# Patient Record
Sex: Female | Born: 1955 | Race: White | Hispanic: No | Marital: Married | Smoking: Current every day smoker
Health system: Southern US, Community
[De-identification: ages and names within clinical notes are randomized; demographics above are authoritative.]

## PROBLEM LIST (undated history)

## (undated) DIAGNOSIS — R410 Disorientation, unspecified: Secondary | ICD-10-CM

## (undated) DIAGNOSIS — I10 Essential (primary) hypertension: Secondary | ICD-10-CM

## (undated) DIAGNOSIS — B1920 Unspecified viral hepatitis C without hepatic coma: Secondary | ICD-10-CM

## (undated) HISTORY — PX: KNEE ARTHROSCOPY: SUR90

---

## 2003-09-12 ENCOUNTER — Other Ambulatory Visit: Payer: Self-pay

## 2005-01-21 ENCOUNTER — Ambulatory Visit: Payer: Self-pay

## 2005-02-20 ENCOUNTER — Emergency Department: Payer: Self-pay | Admitting: Emergency Medicine

## 2005-03-11 ENCOUNTER — Ambulatory Visit: Payer: Self-pay | Admitting: Family Medicine

## 2006-04-23 ENCOUNTER — Emergency Department: Payer: Self-pay | Admitting: Emergency Medicine

## 2006-12-03 ENCOUNTER — Emergency Department: Payer: Self-pay | Admitting: Emergency Medicine

## 2006-12-06 ENCOUNTER — Emergency Department: Payer: Self-pay | Admitting: Emergency Medicine

## 2007-03-24 ENCOUNTER — Emergency Department: Payer: Self-pay | Admitting: Internal Medicine

## 2008-05-14 ENCOUNTER — Emergency Department: Payer: Self-pay | Admitting: Emergency Medicine

## 2008-10-15 ENCOUNTER — Emergency Department: Payer: Self-pay | Admitting: Emergency Medicine

## 2009-06-12 ENCOUNTER — Ambulatory Visit: Payer: Self-pay | Admitting: Family Medicine

## 2009-11-13 ENCOUNTER — Emergency Department: Payer: Self-pay | Admitting: Emergency Medicine

## 2010-06-18 ENCOUNTER — Ambulatory Visit: Payer: Self-pay | Admitting: Internal Medicine

## 2010-07-04 ENCOUNTER — Ambulatory Visit: Payer: Self-pay | Admitting: Internal Medicine

## 2012-05-26 ENCOUNTER — Emergency Department: Payer: Self-pay | Admitting: Emergency Medicine

## 2012-05-28 ENCOUNTER — Emergency Department: Payer: Self-pay | Admitting: Emergency Medicine

## 2012-09-04 ENCOUNTER — Observation Stay: Payer: Self-pay | Admitting: Internal Medicine

## 2012-09-04 LAB — CBC
HCT: 39.3 % (ref 35.0–47.0)
HGB: 13.6 g/dL (ref 12.0–16.0)
MCHC: 34.6 g/dL (ref 32.0–36.0)
Platelet: 294 10*3/uL (ref 150–440)
RBC: 4.71 10*6/uL (ref 3.80–5.20)
WBC: 9.6 10*3/uL (ref 3.6–11.0)

## 2012-09-04 LAB — URINALYSIS, COMPLETE
Blood: NEGATIVE
Glucose,UR: NEGATIVE mg/dL (ref 0–75)
Ketone: NEGATIVE
Nitrite: NEGATIVE
RBC,UR: 2 /HPF (ref 0–5)
Specific Gravity: 1.005 (ref 1.003–1.030)

## 2012-09-04 LAB — TSH: Thyroid Stimulating Horm: 1.47 u[IU]/mL

## 2012-09-04 LAB — DRUG SCREEN, URINE
Amphetamines, Ur Screen: NEGATIVE (ref ?–1000)
Barbiturates, Ur Screen: NEGATIVE (ref ?–200)
Benzodiazepine, Ur Scrn: NEGATIVE (ref ?–200)
Cannabinoid 50 Ng, Ur ~~LOC~~: NEGATIVE (ref ?–50)
MDMA (Ecstasy)Ur Screen: NEGATIVE (ref ?–500)
Methadone, Ur Screen: NEGATIVE (ref ?–300)
Opiate, Ur Screen: NEGATIVE (ref ?–300)

## 2012-09-04 LAB — COMPREHENSIVE METABOLIC PANEL
Alkaline Phosphatase: 116 U/L (ref 50–136)
Anion Gap: 9 (ref 7–16)
BUN: 10 mg/dL (ref 7–18)
Calcium, Total: 8.9 mg/dL (ref 8.5–10.1)
Chloride: 107 mmol/L (ref 98–107)
EGFR (African American): >60
EGFR (Non-African Amer.): >60
Glucose: 106 mg/dL — ABNORMAL HIGH (ref 65–99)
Osmolality: 281 (ref 275–301)
SGPT (ALT): 23 U/L (ref 12–78)
Sodium: 141 mmol/L (ref 136–145)

## 2012-09-04 LAB — ETHANOL: Ethanol %: <0.003 % (ref 0.000–0.080)

## 2012-09-05 ENCOUNTER — Inpatient Hospital Stay: Payer: Self-pay | Admitting: Psychiatry

## 2012-09-05 LAB — POTASSIUM: Potassium: 3.9 mmol/L (ref 3.5–5.1)

## 2012-09-30 ENCOUNTER — Inpatient Hospital Stay: Payer: Self-pay | Admitting: Psychiatry

## 2012-09-30 LAB — COMPREHENSIVE METABOLIC PANEL
Albumin: 3.1 g/dL — ABNORMAL LOW (ref 3.4–5.0)
Alkaline Phosphatase: 96 U/L (ref 50–136)
Anion Gap: 5 — ABNORMAL LOW (ref 7–16)
Calcium, Total: 8.3 mg/dL — ABNORMAL LOW (ref 8.5–10.1)
Chloride: 107 mmol/L (ref 98–107)
Co2: 28 mmol/L (ref 21–32)
EGFR (African American): >60
EGFR (Non-African Amer.): >60
Potassium: 3.3 mmol/L — ABNORMAL LOW (ref 3.5–5.1)
SGOT(AST): 24 U/L (ref 15–37)
SGPT (ALT): 23 U/L (ref 12–78)
Total Protein: 6.2 g/dL — ABNORMAL LOW (ref 6.4–8.2)

## 2012-09-30 LAB — ETHANOL: Ethanol %: <0.003 % (ref 0.000–0.080)

## 2012-09-30 LAB — CBC
HCT: 37.2 % (ref 35.0–47.0)
MCH: 29.6 pg (ref 26.0–34.0)
RBC: 4.31 10*6/uL (ref 3.80–5.20)
RDW: 14.4 % (ref 11.5–14.5)
WBC: 7.4 10*3/uL (ref 3.6–11.0)

## 2012-09-30 LAB — URINALYSIS, COMPLETE
Bacteria: NONE SEEN
Glucose,UR: NEGATIVE mg/dL (ref 0–75)
Ketone: NEGATIVE
RBC,UR: <1 /HPF (ref 0–5)
Specific Gravity: 1.009 (ref 1.003–1.030)
Squamous Epithelial: 1
WBC UR: 3 /HPF (ref 0–5)

## 2012-09-30 LAB — DRUG SCREEN, URINE
Barbiturates, Ur Screen: NEGATIVE (ref ?–200)
Benzodiazepine, Ur Scrn: POSITIVE (ref ?–200)
Cannabinoid 50 Ng, Ur ~~LOC~~: NEGATIVE (ref ?–50)
MDMA (Ecstasy)Ur Screen: NEGATIVE (ref ?–500)
Methadone, Ur Screen: NEGATIVE (ref ?–300)
Opiate, Ur Screen: POSITIVE (ref ?–300)
Tricyclic, Ur Screen: NEGATIVE (ref ?–1000)

## 2013-02-05 ENCOUNTER — Emergency Department: Payer: Self-pay | Admitting: Emergency Medicine

## 2014-05-25 NOTE — Discharge Summary (Signed)
PATIENT NAME:  Rachel BergeronROGERS, Anola A MR#:  [id number] DATE OF BIRTH:  1955/08/20  DATE OF ADMISSION:  09/04/2012 DATE OF DISCHARGE:  09/05/2012  DISCHARGE DIAGNOSES:  1.  Drug overdose/suicide attempt with 15 to 30 tablets of Zoloft, now medically stable and back to baseline.  2.  Hypokalemia, repleted and resolved.   SECONDARY DIAGNOSES: 1.  Hypertension.  2.  Depression.  3.  Chronic pain syndrome.   CONSULTATIONS: Psychiatry, Dr. Jennet MaduroPucilowska.   PROCEDURES AND RADIOLOGY:  None.   LABORATORY PANEL:  UA on admission was negative.  Serum Tylenol level less than 2.   HISTORY AND SHORT HOSPITAL COURSE: The patient is a 59 year old female with the above-mentioned medical problems who was admitted for Zoloft overdose with a suicide attempt, was placed on suicide precautions with 1-to-1 sitter and observation status on the medical service. Please see Dr. Hilbert OdorSainani's dictated history and physical for further details. The patient did not have any further QT prolongations.  Psychiatry consultation was obtained with Dr. Jennet MaduroPucilowska, who recommended transfer to inpatient behavioral medicine. The patient was medically cleared and was transferred to inpatient behavioral medicine in stable condition.   PERTINENT DISCHARGE PHYSICAL EXAMINATION: VITAL SIGNS: On the date of discharge, temperature 98.9, heart rate 65 per minute, respirations 18 per minute, blood pressure 108/70 mmHg.  She was saturating 96% on room air.  CARDIOVASCULAR: S1, S2 normal. No murmurs, rubs or gallop.  LUNGS: Clear to auscultation bilaterally. No wheezing, rales, rhonchi or crepitation.  ABDOMEN: Soft, benign.  NEUROLOGIC: Nonfocal examination.  All other physical examination remained at baseline.   DISCHARGE MEDICATIONS: 1.  Calcium carbonate with vitamin D 2 tablets once daily.  2.  Atenolol/chlorthalidone 100 mg/25 mg 1 tablet p.o. daily.  3.  Oxycodone 30 mg p.o. b.i.d.  4.  Trazodone 200 mg p.o. at bedtime.   DISCHARGE DIET:  Regular.   DISCHARGE ACTIVITY:  As tolerated.   DISCHARGE INSTRUCTIONS AND FOLLOWUP:  The patient was instructed to follow up with her primary care physician in 2 to 4 weeks. She was also instructed to follow up with her psychiatrist once discharged in 1 to 2 weeks.   TOTAL TIME DISCHARGING THIS PATIENT: 45 minutes.   ____________________________ Ellamae SiaVipul S. Sherryll BurgerShah, MD vss:cb D: 09/05/2012 18:32:13 ET T: 09/05/2012 22:49:46 ET JOB#: [id number]  cc: Marvetta Vohs S. Sherryll BurgerShah, MD, <Dictator> Jolanta B. Jennet MaduroPucilowska, MD Primary care physician  Patricia PesaVIPUL S Nasia Cannan MD ELECTRONICALLY SIGNED 09/09/2012 12:26

## 2014-05-25 NOTE — H&P (Signed)
PATIENT NAME:  [name], Nara A MR#:  [id number] DATE OF BIRTH:  Aug 30, 1955  DATE OF ADMISSION:  09/04/2012  PRIMARY CARE PHYSICIAN: Does not have one.   CHIEF COMPLAINT: Drug overdose/suicide attempt.   HISTORY OF PRESENT ILLNESS: This is a 59 year old female who presents to the hospital due to a suicide attempt and a drug overdose. The patient took about 15 to 30 pills 100 mg tablets of Zoloft, although the patient denies that she was not trying to kill herself. The patient's  husband, noticed this and called EMS and she was brought to the ER. The ER physician discussed with poison control. The patient needs to be observed due to a high risk for respiratory depression and also a prolonged QT and serotonin syndrome. The patient clinically does not show any effects of  respiratory depression or any prolonged QT on her EKG presently.   REVIEW OF SYSTEMS:  CONSTITUTIONAL: No documented fever. No weight gain or weight loss.  EYES: No blurred or double vision.  ENT: No tinnitus. No postnasal drip. No redness of the oropharynx.  RESPIRATORY: No cough, no wheeze, hemoptysis, no dyspnea.  CARDIOVASCULAR: No chest pain, no orthopnea, no palpitations, no syncope.  GASTROINTESTINAL: No nausea, no vomiting, diarrhea, no abdominal pain, no melena or hematochezia.  GENITOURINARY: No dysuria or hematuria.  ENDOCRINE: No polyuria or nocturia. No heat or cold intolerance.  HEMATOLOGIC: No anemia, bruising, bleeding.  INTEGUMENTARY: No rashes. No lesions.  MUSCULOSKELETAL: No arthritis. No swelling. No gout.  NEUROLOGIC: No numbness or tingling. No ataxia. No seizure-type activity.  PSYCHIATRIC: No anxiety, no insomnia, positive depression. No ADD.   PAST MEDICAL HISTORY: Consistent with hypertension, depression, chronic pain syndrome.   ALLERGIES: ERYTHROMYCIN, PENICILLIN, TETRACYCLINE; ALL OF WHICH CAUSE HIVES.   SOCIAL HISTORY: Does smoke about pack per day, has been smoking for the past 40 years. No  alcohol abuse does. Does abuse cocaine, did it two days ago.   FAMILY HISTORY: The patient's father died from a suicide attempt, mother died from malignancy, but unknown type.   CURRENT MEDICATIONS: As follows: OxyContin 30 mg b.i.d., atenolol/chlorthalidone 25/100 one tab daily, calcium carbonate with vitamin D 1 tab b.i.d. and trazodone 200 mg at bedtime.   PHYSICAL EXAMINATION:  Is as follows: Temperature is 98.5, pulse 80, respirations 19, blood pressure 162/99, sats 98% on room air.  GENERAL: She is a pleasant-appearing female in no apparent distress.  HEAD, EYES, EARS, NOSE, THROAT: The patient is atraumatic, normocephalic. Extraocular muscles were intact. Pupils are equal and reactive to light. Sclerae anicteric. No conjunctival injection. No pharyngeal erythema.  NECK: Supple. There is no jugular venous distention. No bruits, no lymphadenopathy, no thyromegaly.  HEART: Regular rate and rhythm. No murmurs. No rubs. No clicks.  LUNGS: Clear to auscultation bilaterally. No rales or rhonchi. No wheezes.  ABDOMEN: Soft, flat, nontender, nondistended. Has good bowel sounds. No hepatosplenomegaly appreciated.  EXTREMITIES: No evidence of any cyanosis, clubbing, or peripheral edema. Has +2 pedal and radial pulses bilaterally.  NEUROLOGICAL: The patient is alert, awake and oriented x 3 with no focal motor or sensory deficits appreciated bilaterally.  SKIN: Moist and warm with no rashes appreciated.  LYMPHATIC: There is no cervical or axillary lymphadenopathy.   LABORATORY, DIAGNOSTIC AND RADIOLOGIC DATA: Serum glucose of 106, BUN 10, creatinine 0.6, sodium 141, potassium 3.3, chloride 107, bicarbonate 25. LFTs within normal limits. TSH 1.4. Urine tox positive for cocaine. White cell count 9.6, hemoglobin 13.6, hematocrit 39.3, platelet count 294. Urinalysis within normal limits.  ASSESSMENT AND PLAN: This is a 59 year old female with history of depression, chronic pain syndrome, hypertension,  presents to the hospital due to drug overdose/suicide attempt. The patient took about 15 to 30 pills of Zoloft 100 mg tablet.   1.  Drug overdose/suicide attempt. Again, as mentioned, the patient did taking increasing amounts of Zoloft 100 mg tabs about 15 to 30 pills about 3 to 4 hours ago. ER physician discussed with poison control that the patient is at high risk for having serotonin syndrome and respiratory depression and a possible prolonged QT syndrome., Therefore, observe the patient on telemetry, check a repeat EKG in the morning. The patient has been involuntarily committed. Will continue with a sitter and get a psychiatric consult in the morning.  2. Hypertension. The patient is  presently hemodynamically stable. Continue atenolol, chlorthalidone.  3.  Chronic pain. Continue OxyContin.  4.  Hypokalemia. I will replace her potassium accordingly and repeat it in the morning.   CODE STATUS: The patient is a full code.   TIME SPENT: 45 minutes    ____________________________ Rolly Pancake. Cherlynn Kaiser, MD vjs:cc D: 09/04/2012 18:45:26 ET T: 09/04/2012 20:16:56 ET JOB#: [id number]  cc: Rolly Pancake. Cherlynn Kaiser, MD, <Dictator> Houston Siren MD ELECTRONICALLY SIGNED 09/05/2012 14:48

## 2014-05-25 NOTE — H&P (Signed)
PATIENT NAME:  Rachel [name], Rachel [name] MR#:  [id number] DATE OF BIRTH:  Nov 14, 1955  DATE OF ADMISSION:  09/05/2012  REFERRING PHYSICIAN: Vipul S. Sherryll BurgerShah, MD  ATTENDING PHYSICIAN: Taylor Levick B. Jennet MaduroPucilowska, MD  IDENTIFYING DATA: Ms.  Rachel RudRogers is [name] 59 year old female with history of depression.   CHIEF COMPLAINT: "I overdosed."  HISTORY OF PRESENT ILLNESS: Ms. Rachel RudRogers was admitted to medical floor after she overdosed on Zoloft. Her husband noticed the overdose and called the ambulance. She has been [name] patient at [facility] and has been taking Zoloft 100 mg [name] day for an extended period of time. She reports that she got into an argument with her husband, who has mental illness himself, has been off medications and difficult to be around. She denies that she wanted to kill herself, but really felt at the end of her rope. She reports that she had not planned this attempt and did it on an impulse. She reports poor sleep, decreased appetite, anhedonia, feeling of guilt, hopelessness, worthlessness, poor sleep and energy. She cries. She isolates herself. She has no interest in any activities except for taking care of her dog. She reports periods of increased mood, hyperactivity, insomnia, irritability and out-of-character behavior lasting for [name] week or so. Her last episode was in December 2013 when she stole something from [employer] and was arrested. She has many episodes over her lifetime. Sometimes she has smaller episodes, lasting 2 or 3 days. She denies ever getting psychotic. She denies alcohol or substance abuse. She, however, does not use pain killers. She was prescribed some OxyContin for an abscess in the past and admitted to the nurse that she is getting Lortab from the streets. On another occasion, she said that she can get Dilaudid. This reportedly is for back pain.   PAST PSYCHIATRIC HISTORY: She reports 1 suicide attempt when young. She was not hospitalized for that. She has never been in substance abuse treatment. She has  [name] psychiatrist.   FAMILY PSYCHIATRIC HISTORY: Her father committed suicide and also another cousin.   PAST MEDICAL HISTORY: Hepatitis C, back pain, hypertension.   ALLERGIES: ERYTHROMYCIN, PENICILLIN, TETRACYCLINE.   MEDICATION ON ADMISSION: Atenolol 50 mg daily, calcium and vitamin D combination, [place] 25 mg daily, OxyContin 30 mg twice daily.   SOCIAL HISTORY: She used to work in [employer] homes and at [employer]. She stopped working 2 years ago when her arthritis would not allow her to work anymore. She lost insurance at that time. She has no income. All of her income comes from her husband's disability. They do not  get any food stamps, as the husband does not have his ID and is paranoid and refuses to apply for food stamps. She has not been seeing any of her doctors since 2012. She does not apply for disability, yet her financial situation is very bad. She eats once [name] day. She has [name] dog and believes the dog food comes before her own.  REVIEW OF SYSTEMS:    CONSTITUTIONAL: No fevers or chills. No weight changes.  EYES: No double or blurred vision.  ENT: No hearing loss.  RESPIRATORY: No shortness of breath or cough.  CARDIOVASCULAR: No chest pain or orthopnea.  GASTROINTESTINAL: No abdominal pain, nausea, vomiting or diarrhea.  GENITOURINARY: No incontinence or frequency.  ENDOCRINE: No heat or cold intolerance.  LYMPHATIC: No anemia or easy bruising.  INTEGUMENTARY: No acne or rash.  MUSCULOSKELETAL: Positive for back pain and arthritis in her hands.  NEUROLOGIC: No tingling or weakness.  PSYCHIATRIC: See  history of present illness for details.   PHYSICAL EXAMINATION: VITAL SIGNS: Blood pressure 120/86, pulse 69, respirations 18, temperature 98.  GENERAL: This is [name] slender female in no acute distress.  HEENT: The pupils are equal, round and reactive to light. Sclerae are anicteric.  NECK: Supple. No thyromegaly.  LUNGS: Clear to auscultation. No dullness to percussion.  HEART:  Regular rhythm and rate. No murmurs, rubs or gallops.  ABDOMEN: Soft, nontender, nondistended. Positive bowel sounds.  MUSCULOSKELETAL: Normal muscle strength in all extremities.  SKIN: No rashes or bruises.  LYMPHATIC: No cervical adenopathy.  NEUROLOGIC: Cranial nerves II through XII were intact.   LABORATORY DATA: Chemistries are within normal limits. LFTs within normal limits. TSH 1.47. Urine tox screen is positive for cocaine. CBC within normal limits. Urinalysis is not suggestive of urinary tract infection. Serum acetaminophen less than 2.  MENTAL STATUS EXAMINATION ON ADMISSION: The patient is alert and oriented to person, place, time and situation. She is marginally cooperative, rather unhappy to be coming to psychiatry after an overdose. She is wearing hospital scrubs. She maintains good eye contact. Her speech is loud and pressured. Mood is depressed with agitated affect. Thought processing is logical. There is flight of ideas. She denies suicidal or homicidal ideation, but was admitted after [name] suicide attempt by medication overdose. She denies psychotic symptoms. No paranoia, delusions or hallucinations. Her cognition is grossly intact. She registers 3 out of 3 and recalls 3 out of 3 objects after 5 minutes. She can spell "world" forward and backward. She knows the current president. Her insight and judgment are questionable.   SUICIDE RISK ASSESSMENT ON ADMISSION: This is [name] patient with [name] long history of depression, mood instability, substance abuse, who is in [name] difficult social situation and who attempted suicide.   INITIAL DIAGNOSES:  AXIS I: Mood disorder, not otherwise specified; rule out bipolar affective disorder; opioid abuse; cocaine abuse.  AXIS II: Deferred.  AXIS III: Hypertension, hepatitis C, arthritis, chronic pain.  AXIS IV: Mental illness, substance abuse, financial, employment,  marital, access to care, primary support.  AXIS V: Global Assessment of Functioning on  admission: 25.   PLAN: The patient was admitted to [facility] for safety, stabilization and medication management. She was initially placed on suicide precautions and was closely monitored for any unsafe behaviors. She underwent full psychiatric and risk assessment. She received pharmacotherapy, individual and group psychotherapy, substance abuse counseling and support from therapeutic milieu.  1.  Suicidal ideation: The patient denies. 2.  We will start Tegretol for mood stabilization. Her liver function tests are normal. We will recheck liver function test. She has no insurance, so the only other option would be an antipsychotic, which she will not be able to afford. We will attempt to obtain [facility] support.  3.  Medical: We will continue all other medications as prescribed by her attending at the time of transfer from medicine.  4.  Substance abuse: She minimizes her problems and declines treatment.  5.  Disposition: She will be discharged to home.    ____________________________ Ellin Goodie. Jennet Maduro, MD jbp:jm D: 09/06/2012 15:37:39 ET T: 09/06/2012 16:36:36 ET JOB#: [id number]  cc: Jenetta Wease B. Jennet Maduro, MD, <Dictator> Shari Prows MD ELECTRONICALLY SIGNED 09/09/2012 6:38

## 2014-05-25 NOTE — Consult Note (Signed)
Brief Consult Note: Diagnosis: Bipolar II.   Patient was seen by consultant.   Recommend further assessment or treatment.   Orders entered.   Comments: Will admit to psychiatry.  Electronic Signatures: Kristine LineaPucilowska, Maribell Demeo (MD)  (Signed 29-Aug-14 12:26)  Authored: Brief Consult Note   Last Updated: 29-Aug-14 12:26 by Kristine LineaPucilowska, Maryana Pittmon (MD)

## 2014-05-25 NOTE — Discharge Summary (Signed)
PATIENT NAME:  Rachel Rachel [name], Rachel Rachel [name] MR#:  [id number] DATE OF BIRTH:  08/22/1955  DATE OF ADMISSION:  09/04/2012 DATE OF DISCHARGE:  09/05/2012  DISCHARGE DIAGNOSES:  1.  Drug overdose/suicide attempt with 15 to 30 tablets of Zoloft, now medically stable and back to baseline.  2.  Hypokalemia, repleted and resolved.   SECONDARY DIAGNOSES: 1.  Hypertension.  2.  Depression.  3.  Chronic pain syndrome.   CONSULTATIONS: Psychiatry, Dr. Pucilowska.   PROCEDURES AND RADIOLOGY:  None.   LABORATORY PANEL:  UA on admission was negative.  Serum Tylenol level less than 2.   HISTORY AND SHORT HOSPITAL COURSE: The patient is Rachel [name] 59-year-old female with the above-mentioned medical problems who was admitted for Zoloft overdose with Rachel [name] suicide attempt, was placed on suicide precautions with 1-to-1 sitter and observation status on the medical service. Please see Dr. Sainani's dictated history and physical for further details. The patient did not have any further QT prolongations.  Psychiatry consultation was obtained with Dr. Pucilowska, who recommended transfer to inpatient behavioral medicine. The patient was medically cleared and was transferred to inpatient behavioral medicine in stable condition.   PERTINENT DISCHARGE PHYSICAL EXAMINATION: VITAL SIGNS: On the date of discharge, temperature 98.9, heart rate 65 per minute, respirations 18 per minute, blood pressure 108/70 mmHg.  She was saturating 96% on room air.  CARDIOVASCULAR: S1, S2 normal. No murmurs, rubs or gallop.  LUNGS: Clear to auscultation bilaterally. No wheezing, rales, rhonchi or crepitation.  ABDOMEN: Soft, benign.  NEUROLOGIC: Nonfocal examination.  All other physical examination remained at baseline.   DISCHARGE MEDICATIONS: 1.  Calcium carbonate with vitamin D 2 tablets once daily.  2.  Atenolol/chlorthalidone 100 mg/25 mg 1 tablet p.o. daily.  3.  Oxycodone 30 mg p.o. b.i.d.  4.  Trazodone 200 mg p.o. at bedtime.   DISCHARGE DIET:  Regular.   DISCHARGE ACTIVITY:  As tolerated.   DISCHARGE INSTRUCTIONS AND FOLLOWUP:  The patient was instructed to follow up with her primary care physician in 2 to 4 weeks. She was also instructed to follow up with her psychiatrist once discharged in 1 to 2 weeks.   TOTAL TIME DISCHARGING THIS PATIENT: 45 minutes.   ____________________________ Rachel Wrigley S. Omer Monter, MD vss:cb D: 09/05/2012 18:32:13 ET T: 09/05/2012 22:49:46 ET JOB#: [id number]  cc: Rachel Helbling S. Mirjana Tarleton, MD, <Dictator> Rachel B. Pucilowska, MD Primary care physician  Rachel Vanderwoude S Hallie Ertl MD ELECTRONICALLY SIGNED 09/09/2012 12:26 

## 2014-05-25 NOTE — Consult Note (Signed)
Brief Consult Note: Diagnosis: Mood d/o NOS, S/P suicide attempts.   Patient was seen by consultant.   Recommend further assessment or treatment.   Orders entered.   Discussed with Attending MD.   Comments: Ms. Rachel [name] has a h/o depression, anxiety and mood instability. She was admitted after a suicide attempt by OD in the context of multiple stressors.   PLAN: 1. Will transfer to psychiatry.  Electronic Signatures: Kristine LineaPucilowska, Doreen Garretson (MD)  (Signed 04-Aug-14 14:29)  Authored: Brief Consult Note   Last Updated: 04-Aug-14 14:29 by Kristine LineaPucilowska, Takila Kronberg (MD)

## 2014-06-12 NOTE — H&P (Signed)
PATIENT NAME:  Rachel [name], [name] [id number] OF BIRTH:  01-17-1956 OF ADMISSION:  09/30/2012 PHYSICIAN: Emergency Room MDPHYSICIAN: Yzabelle Calles B. Jennet MaduroPucilowska, MD DATA: Ms.  Rachel [name] is a 59 year old female with history of depression and substance abuse.  COMPLAINT: "I could not afford my medications." OF PRESENT ILLNESS: Ms. Rachel [name] was [facility] weeks ago. She was doing fine but run out of Risperdal that she found very helpful. She couldn?t afford it and there was a problem with ALAMAP application. She became increasingly moody and irritable. She relapsed on cocaine and started abusing prescription pills. She became suicidal and homicidal when she realized that her girlfriend was offered a job that the patient was hoping to get. She reports poor sleep, decreased appetite, anhedonia, feeling of guilt, hopelessness, worthlessness, poor sleep and energy, crying spells, social isolation, irritability and thoughts of hurting herself and others.  PSYCHIATRIC HISTORY: There was one suicide attempt in her youth. She has been abusing multiple substances but has never in substance abuse treatment. She has a psychiatrist.  PSYCHIATRIC HISTORY: Her father and a cousin committed a suicide.   MEDICAL HISTORY: Hepatitis C, back pain, hypertension.   ALLERGIES: ERYTHROMYCIN, PENICILLIN, TETRACYCLINE.  ON ADMISSION: The patient was not compliant. Medications at the time of recent discharge: Medication Instructions  trazodone  200 milligram(s) orally once a day (at bedtime) for insomnia.   atenolol-chlorthalidone 100 mg-25 mg oral tablet  1 tab(s) orally once a day for high blood pressure.   risperidone 2 mg oral tablet  1 tab(s) orally 2 times a day for mood stabilization.   hydroxyzine hydrochloride 50 mg oral tablet  1 tab(s) orally every 8 hours, As needed, anxiety   calcarb with d 600 mg-400 intl units oral tablet  2  orally once a day for bone loss prevention.   albuterol cfc free 90 mcg/inh inhalation aerosol    inhaled 2 puffs every 4 hours as needed for shortness of breath.   carbamazepine 200 mg oral tablet  1 tab(s) orally 2 times a day for mood stabilization.   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 She stopped working 2 years ago when her arthritis would not allow her to work anymore. She lost insurance at that time. She has no income. All of her income comes from her husband's disability. They do not  get any food stamps, as the husband does not have his ID and is paranoid and refuses to apply for food stamps. She has not been seeing any of her doctors since 2012. She does not apply for disability, yet her financial situation is very bad. She eats once a day. She has a dog and believes the dog food comes before her own. OF SYSTEMS:   No fevers or chills. No weight changes. No double or blurred vision. No hearing loss. No shortness of breath or cough. No chest pain or orthopnea. No abdominal pain, nausea, vomiting or diarrhea. No incontinence or frequency. No heat or cold intolerance. No anemia or easy bruising. No acne or rash. Positive for back pain and arthritis in her hands. No tingling or weakness. See history of present illness for details.  EXAMINATION:SIGNS: Blood pressure 170/90, pulse 88, respirations 20, temperature 98. This is a slender female in no acute distress. The pupils are equal, round and reactive to light. Sclerae are anicteric. Supple. No thyromegaly. Clear to auscultation. No dullness to percussion. Regular rhythm and rate. No murmurs, rubs or gallops. Soft, nontender, nondistended. Positive bowel sounds. Normal  muscle strength in all extremities. No rashes or bruises. No cervical adenopathy. Cranial nerves II through XII were intact.  DATA: Chemistries are within normal limits except K 3.3. BAL is zero. LFTs within normal limits. TSH 2.29. Urine tox screen is positive for benzodiazepines, cocaine and opioids. CBC within normal limits. Urinalysis is not suggestive of  urinary tract infection.  STATUS EXAMINATION ON ADMISSION: The patient is alert and oriented to person, place, time and situation. She is pleasant, polite and cooperative with me as she recognizes me from previous admission. She is irritable and argumentative with staff in the ER. She is wearing hospital scrubs. She maintains good eye contact. Her speech is loud and pressured at times. Mood is depressed with agitated affect. Thought processing is logical. She endorses suicidal or homicidal ideation, but is able to contract for safety in the hospital. She denies paranoia, delusions or hallucinations. Her cognition is grossly intact. She registers 3 out of 3 and recalls 3 out of 3 objects after 5 minutes. She can spell "world" forward and backward. She knows the current president. Her insight and judgment are questionable.  RISK ASSESSMENT ON ADMISSION: This is a patient with a long history of depression, mood instability, substance abuse, poor treatment compliance and suicide attempts who is in a difficult social situation. She is at increased risk of suicide.   DIAGNOSES: I:  Bipolar II disorder.   Opioid abuse.abuse. abuse. AXIS II:  Deferred. III:  Hypertension, hepatitis C, arthritis, chronic pain. IV:  Mental illness, substance abuse, financial, employment, marital, access to care, primary support. V:  Global Assessment of Functioning on admission 25.  The patient was admitted to [facility] Unit for safety, stabilization and medication management. She was initially placed on suicide precautions and was closely monitored for any unsafe behaviors. She underwent full psychiatric and risk assessment. She received pharmacotherapy, individual and group psychotherapy, substance abuse counseling and support from therapeutic milieu.   Suicidal ideation: The patient is able to contract for safety.   Mood: We restarted all her medications. Access is a problem.   Medical:  We continued antihypertensives, inhalers and Vit D supplement as prescribed by PCP.     Substance abuse: She minimizes her problems and declines treatment.   Disposition: She will be discharged to home.     Electronic Signatures: Kristine LineaPucilowska, Marieliz Strang (MD)  (Signed on 30-Aug-14 07:38)  Authored  Last Updated: 30-Aug-14 07:38 by Kristine LineaPucilowska, Vallerie Hentz (MD)

## 2015-07-08 ENCOUNTER — Encounter: Payer: Self-pay | Admitting: Pharmacist

## 2015-07-18 ENCOUNTER — Encounter: Payer: Self-pay | Admitting: Pharmacist

## 2015-08-08 ENCOUNTER — Encounter: Payer: Self-pay | Admitting: Pharmacist

## 2015-09-18 ENCOUNTER — Encounter (INDEPENDENT_AMBULATORY_CARE_PROVIDER_SITE_OTHER): Payer: Self-pay

## 2016-01-25 ENCOUNTER — Emergency Department
Admission: EM | Admit: 2016-01-25 | Discharge: 2016-01-25 | Disposition: A | Discharge status: Home / Self Care | Payer: Medicare Other | Attending: Emergency Medicine | Admitting: Emergency Medicine

## 2016-01-25 ENCOUNTER — Encounter: Payer: Self-pay | Admitting: Emergency Medicine

## 2016-01-25 DIAGNOSIS — I10 Essential (primary) hypertension: Secondary | ICD-10-CM | POA: Insufficient documentation

## 2016-01-25 DIAGNOSIS — F17210 Nicotine dependence, cigarettes, uncomplicated: Secondary | ICD-10-CM | POA: Insufficient documentation

## 2016-01-25 DIAGNOSIS — L02411 Cutaneous abscess of right axilla: Secondary | ICD-10-CM

## 2016-01-25 HISTORY — DX: Unspecified viral hepatitis C without hepatic coma: B19.20

## 2016-01-25 HISTORY — DX: Essential (primary) hypertension: I10

## 2016-01-25 MED ORDER — KETOROLAC TROMETHAMINE 60 MG/2ML IM SOLN
60.0000 mg | Freq: Once | INTRAMUSCULAR | Status: AC
  Administered 2016-01-25: 60 mg via INTRAMUSCULAR
  Filled 2016-01-25: qty 2

## 2016-01-25 MED ORDER — IBUPROFEN 600 MG PO TABS: 600.0000 mg | ORAL_TABLET | Freq: Three times a day (TID) | ORAL | 0 refills | Status: DC | PRN

## 2016-01-25 MED ORDER — HYDROMORPHONE HCL 1 MG/ML IJ SOLN
1.0000 mg | Freq: Once | INTRAMUSCULAR | Status: AC
  Administered 2016-01-25: 1 mg via INTRAMUSCULAR
  Filled 2016-01-25: qty 1

## 2016-01-25 MED ORDER — OXYCODONE-ACETAMINOPHEN 7.5-325 MG PO TABS: 1.0000 | ORAL_TABLET | ORAL | 0 refills | Status: DC | PRN

## 2016-01-25 MED ORDER — CLINDAMYCIN HCL 150 MG PO CAPS: 150.0000 mg | ORAL_CAPSULE | Freq: Four times a day (QID) | ORAL | 0 refills | Status: DC

## 2016-01-25 NOTE — ED Triage Notes (Signed)
Painful reddened area R axilla. States started as "hair bump"

## 2016-01-25 NOTE — Discharge Instructions (Signed)
Apply warm compresses for 10 minutes 3 times a day.

## 2016-01-25 NOTE — ED Notes (Signed)

## 2016-01-25 NOTE — ED Provider Notes (Signed)
[facility] Emergency Department Provider Note   ____________________________________________   First MD Initiated Contact with Patient 01/25/16 1322     (approximate)  I have reviewed the triage vital signs and the nursing notes.   HISTORY  Chief Complaint Abscess    HPI Rachel [name] is a 60 y.o. female patient complaining of pain and redness and swelling to the right axillary area for 2 days. Patient area started as a hair bump. Patient rating the pain as a 10 over 10. Patient described a pain as "sharp". Patient state unable to raise her arm above his secondary to pain. No palliative measures taken for this complaint.   Past Medical History:  Diagnosis Date  . Hepatitis C   . Hypertension     There are no active problems to display for this patient.   History reviewed. No pertinent surgical history.  Prior to Admission medications   Medication Sig Start Date End Date Taking? Authorizing Provider  clindamycin (CLEOCIN) 150 MG capsule Take 1 capsule (150 mg total) by mouth 4 (four) times daily. 01/25/16   Joni Reiningonald K Ivet Guerrieri, PA-C  ibuprofen (ADVIL,MOTRIN) 600 MG tablet Take 1 tablet (600 mg total) by mouth every 8 (eight) hours as needed. 01/25/16   Joni Reiningonald K Yulitza Shorts, PA-C  oxyCODONE-acetaminophen (PERCOCET) 7.5-325 MG tablet Take 1 tablet by mouth every 4 (four) hours as needed for severe pain. 01/25/16   Joni Reiningonald K Bee Marchiano, PA-C    Allergies Tetracyclines & related  No family history on file.  Social History Social History  Substance Use Topics  . Smoking status: Current Every Day Smoker    Packs/day: 1.00    Types: Cigarettes  . Smokeless tobacco: Not on file  . Alcohol use Not on file    Review of Systems Constitutional: No fever/chills Eyes: No visual changes. ENT: No sore throat. Cardiovascular: Denies chest pain. Respiratory: Denies shortness of breath. Gastrointestinal: No abdominal pain.  No nausea, no vomiting.  No diarrhea.   No constipation. Genitourinary: Negative for dysuria. Musculoskeletal: Negative for back pain. Skin: Negative for rash. Redness and swelling to right axillary area Neurological: Negative for headaches, focal weakness or numbness. Allergic/Immunilogical: See medication list ____________________________________   PHYSICAL EXAM:  VITAL SIGNS: ED Triage Vitals  Enc Vitals Group     BP 01/25/16 1245 (!) 153/71     Pulse Rate 01/25/16 1245 (!) 103     Resp 01/25/16 1245 20     Temp 01/25/16 1245 98.3 F (36.8 C)     Temp Source 01/25/16 1245 Oral     SpO2 01/25/16 1245 98 %     Weight 01/25/16 1247 111 lb (50.3 kg)     Height 01/25/16 1247 5\' 6"  (1.676 m)     Head Circumference --      Peak Flow --      Pain Score 01/25/16 1248 10     Pain Loc --      Pain Edu? --      Excl. in GC? --     Constitutional: Alert and oriented. Well appearing and in no acute distress. Eyes: Conjunctivae are normal. PERRL. EOMI. Head: Atraumatic. Nose: No congestion/rhinnorhea. Mouth/Throat: Mucous membranes are moist.  Oropharynx non-erythematous. Neck: No stridor.  No cervical spine tenderness to palpation. Hematological/Lymphatic/Immunilogical: No cervical lymphadenopathy. Cardiovascular: Normal rate, regular rhythm. Grossly normal heart sounds.  Good peripheral circulation. Respiratory: Normal respiratory effort.  No retractions. Lungs CTAB. Gastrointestinal: Soft and nontender. No distention. No abdominal bruits. No CVA tenderness.  Musculoskeletal: No lower extremity tenderness nor edema.  No joint effusions. Neurologic:  Normal speech and language. No gross focal neurologic deficits are appreciated. No gait instability. Skin:  Skin is warm, dry and intact. No rash noted. Nodule nonfluctuant lesion right axillary area. Psychiatric: Mood and affect are normal. Speech and behavior are normal.  ____________________________________________   LABS (all labs ordered are listed, but only abnormal  results are displayed)  Labs Reviewed - No data to display ____________________________________________  EKG   ____________________________________________  RADIOLOGY   ____________________________________________   PROCEDURES  Procedure(s) performed: None  Procedures  Critical Care performed: No  ____________________________________________   INITIAL IMPRESSION / ASSESSMENT AND PLAN / ED COURSE  Pertinent labs & imaging results that were available during my care of the patient were reviewed by me and considered in my medical decision making (see chart for details).  Right axillary abscess. Patient given discharge care instructions. Patient given a prescription for clindamycin, Percocet, and ibuprofen. Patient advised return back to the ED if no improvement or worsening complaint 2 days.  Clinical Course    Patient has a early onset of a right axillary abscess. Lesion is nonfluctuant and discussed rationale for not incision and drainage this time. Patient will be started on clindamycin and given Percocet and ibuprofen.  ____________________________________________   FINAL CLINICAL IMPRESSION(S) / ED DIAGNOSES  Final diagnoses:  Abscess of axilla, right      NEW MEDICATIONS STARTED DURING THIS VISIT:  New Prescriptions   CLINDAMYCIN (CLEOCIN) 150 MG CAPSULE    Take 1 capsule (150 mg total) by mouth 4 (four) times daily.   IBUPROFEN (ADVIL,MOTRIN) 600 MG TABLET    Take 1 tablet (600 mg total) by mouth every 8 (eight) hours as needed.   OXYCODONE-ACETAMINOPHEN (PERCOCET) 7.5-325 MG TABLET    Take 1 tablet by mouth every 4 (four) hours as needed for severe pain.     Note:  This document was prepared using Dragon voice recognition software and may include unintentional dictation errors.    Joni ReiningRonald K Adalina Dopson, PA-C 01/25/16 1339    Governor Rooksebecca Lord, MD 01/25/16 [phone]

## 2016-04-24 ENCOUNTER — Emergency Department: Payer: Medicare HMO

## 2016-04-24 ENCOUNTER — Emergency Department
Admission: EM | Admit: 2016-04-24 | Discharge: 2016-04-25 | Disposition: A | Discharge status: Home / Self Care | Payer: Medicare HMO | Attending: Emergency Medicine | Admitting: Emergency Medicine

## 2016-04-24 ENCOUNTER — Encounter: Payer: Self-pay | Admitting: Emergency Medicine

## 2016-04-24 DIAGNOSIS — Z5181 Encounter for therapeutic drug level monitoring: Secondary | ICD-10-CM | POA: Diagnosis not present

## 2016-04-24 DIAGNOSIS — Z79899 Other long term (current) drug therapy: Secondary | ICD-10-CM | POA: Insufficient documentation

## 2016-04-24 DIAGNOSIS — I10 Essential (primary) hypertension: Secondary | ICD-10-CM | POA: Diagnosis not present

## 2016-04-24 DIAGNOSIS — R441 Visual hallucinations: Secondary | ICD-10-CM | POA: Insufficient documentation

## 2016-04-24 DIAGNOSIS — R4182 Altered mental status, unspecified: Secondary | ICD-10-CM | POA: Diagnosis not present

## 2016-04-24 DIAGNOSIS — F4489 Other dissociative and conversion disorders: Secondary | ICD-10-CM | POA: Diagnosis not present

## 2016-04-24 DIAGNOSIS — F17210 Nicotine dependence, cigarettes, uncomplicated: Secondary | ICD-10-CM | POA: Insufficient documentation

## 2016-04-24 DIAGNOSIS — R918 Other nonspecific abnormal finding of lung field: Secondary | ICD-10-CM | POA: Diagnosis not present

## 2016-04-24 DIAGNOSIS — R410 Disorientation, unspecified: Secondary | ICD-10-CM | POA: Diagnosis not present

## 2016-04-24 DIAGNOSIS — R69 Illness, unspecified: Secondary | ICD-10-CM | POA: Diagnosis not present

## 2016-04-24 LAB — CBC WITH DIFFERENTIAL/PLATELET
BASOS ABS: 0.1 10*3/uL (ref 0–0.1)
BASOS PCT: 0 %
EOS ABS: 0.1 10*3/uL (ref 0–0.7)
Eosinophils Relative: 1 %
HEMATOCRIT: 45.4 % (ref 35.0–47.0)
HEMOGLOBIN: 15.3 g/dL (ref 12.0–16.0)
Lymphocytes Relative: 15 %
Lymphs Abs: 1.8 10*3/uL (ref 1.0–3.6)
MCH: 27.1 pg (ref 26.0–34.0)
MCHC: 33.7 g/dL (ref 32.0–36.0)
MCV: 80.3 fL (ref 80.0–100.0)
MONOS PCT: 7 %
Monocytes Absolute: 0.8 10*3/uL (ref 0.2–0.9)
NEUTROS PCT: 77 %
Neutro Abs: 9.3 10*3/uL — ABNORMAL HIGH (ref 1.4–6.5)
Platelets: 326 10*3/uL (ref 150–440)
RBC: 5.65 MIL/uL — AB (ref 3.80–5.20)
RDW: 16.5 % — ABNORMAL HIGH (ref 11.5–14.5)
WBC: 12.1 10*3/uL — ABNORMAL HIGH (ref 3.6–11.0)

## 2016-04-24 LAB — COMPREHENSIVE METABOLIC PANEL
ALBUMIN: 4.2 g/dL (ref 3.5–5.0)
ALT: 20 U/L (ref 14–54)
ANION GAP: 11 (ref 5–15)
AST: 21 U/L (ref 15–41)
Alkaline Phosphatase: 104 U/L (ref 38–126)
BILIRUBIN TOTAL: 0.7 mg/dL (ref 0.3–1.2)
BUN: 18 mg/dL (ref 6–20)
CHLORIDE: 102 mmol/L (ref 101–111)
CO2: 21 mmol/L — ABNORMAL LOW (ref 22–32)
Calcium: 9 mg/dL (ref 8.9–10.3)
Creatinine, Ser: 0.83 mg/dL (ref 0.44–1.00)
GFR calc Af Amer: >60 mL/min (ref >60)
Glucose, Bld: 117 mg/dL — ABNORMAL HIGH (ref 65–99)
POTASSIUM: 3.6 mmol/L (ref 3.5–5.1)
Sodium: 134 mmol/L — ABNORMAL LOW (ref 135–145)
TOTAL PROTEIN: 7.8 g/dL (ref 6.5–8.1)

## 2016-04-24 LAB — URINE DRUG SCREEN, QUALITATIVE (ARMC ONLY)
AMPHETAMINES, UR SCREEN: NOT DETECTED
BENZODIAZEPINE, UR SCRN: NOT DETECTED
Barbiturates, Ur Screen: NOT DETECTED
CANNABINOID 50 NG, UR ~~LOC~~: NOT DETECTED
Cocaine Metabolite,Ur ~~LOC~~: NOT DETECTED
MDMA (Ecstasy)Ur Screen: NOT DETECTED
Methadone Scn, Ur: NOT DETECTED
Opiate, Ur Screen: POSITIVE — AB
PHENCYCLIDINE (PCP) UR S: NOT DETECTED
Tricyclic, Ur Screen: NOT DETECTED

## 2016-04-24 LAB — URINALYSIS, COMPLETE (UACMP) WITH MICROSCOPIC
BACTERIA UA: NONE SEEN
Bilirubin Urine: NEGATIVE
GLUCOSE, UA: NEGATIVE mg/dL
Hgb urine dipstick: NEGATIVE
KETONES UR: NEGATIVE mg/dL
LEUKOCYTES UA: NEGATIVE
NITRITE: NEGATIVE
PH: 5 (ref 5.0–8.0)
PROTEIN: NEGATIVE mg/dL
Specific Gravity, Urine: 1.009 (ref 1.005–1.030)

## 2016-04-24 LAB — ETHANOL

## 2016-04-24 LAB — TROPONIN I: Troponin I: <0.03 ng/mL (ref <0.03)

## 2016-04-24 MED ORDER — LORAZEPAM 2 MG/ML IJ SOLN
2.0000 mg | Freq: Once | INTRAMUSCULAR | Status: AC
  Administered 2016-04-24: 2 mg via INTRAMUSCULAR
  Filled 2016-04-24: qty 1

## 2016-04-24 MED ORDER — HALOPERIDOL LACTATE 5 MG/ML IJ SOLN
5.0000 mg | Freq: Once | INTRAMUSCULAR | Status: AC
  Administered 2016-04-24: 5 mg via INTRAMUSCULAR
  Filled 2016-04-24: qty 1

## 2016-04-24 MED ORDER — NICOTINE 21 MG/24HR TD PT24
21.0000 mg | MEDICATED_PATCH | Freq: Once | TRANSDERMAL | Status: AC
  Administered 2016-04-24: 21 mg via TRANSDERMAL
  Filled 2016-04-24: qty 1

## 2016-04-24 MED ORDER — DIPHENHYDRAMINE HCL 50 MG/ML IJ SOLN
50.0000 mg | Freq: Once | INTRAMUSCULAR | Status: AC
  Administered 2016-04-24: 50 mg via INTRAMUSCULAR
  Filled 2016-04-24: qty 1

## 2016-04-24 MED ORDER — OLANZAPINE 5 MG PO TABS
5.0000 mg | ORAL_TABLET | Freq: Two times a day (BID) | ORAL | Status: DC
  Administered 2016-04-24 – 2016-04-25 (×2): 5 mg via ORAL
  Filled 2016-04-24: qty 1

## 2016-04-24 MED ORDER — OLANZAPINE 5 MG PO TABS
ORAL_TABLET | ORAL | Status: AC
  Administered 2016-04-24: 5 mg via ORAL
  Filled 2016-04-24: qty 1
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
## 2016-04-24 NOTE — ED Notes (Signed)
Pt upset in room stating that she wants to leave. Pt 's family members in verbal altercation with pt due to the fact that they want her to stay and receive more help. Per Dr. Shaune PollackLord, pt is IVC'd at this point. Pt moved to room 20. Danelle EarthlyNoel RN given report on pt.

## 2016-04-24 NOTE — ED Notes (Signed)
Husband states patient unable to have MRI due to possible needle piece stuck in neck. Has hx of drug use.

## 2016-04-24 NOTE — ED Notes (Signed)
Updated pt on status.

## 2016-04-24 NOTE — ED Notes (Signed)
SOC

## 2016-04-24 NOTE — ED Notes (Signed)
Pt in doorway yelling at staff stating that she doesn't understand why she's here, staff has explained to pt multiple times as to why she is here as well as the process that is taking place. Pt continues yelling and cursing at staff. Pt slams door, Rn Danelle EarthlyNoel, [employer] Mc[employer]doo, this tech at pt bedside at this time

## 2016-04-24 NOTE — ED Notes (Signed)
Pt given graham crackers and peanut butter after being explained that she could not have a third sandwich tray, pt upset and yelling at this tech, pt threw graham crackers and peanut butter in the hallway and again slammed her door. Officer Mcadoo in pt room.
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
## 2016-04-25 LAB — RAPID HIV SCREEN (HIV 1/2 AB+AG)
HIV 1/2 Antibodies: NONREACTIVE
HIV-1 P24 ANTIGEN - HIV24: NONREACTIVE

## 2016-04-25 LAB — CK: CK TOTAL: 349 U/L — AB (ref 38–234)

## 2016-04-25 LAB — AMMONIA
AMMONIA: 30 umol/L (ref 9–35)
Ammonia: 48 umol/L — ABNORMAL HIGH (ref 9–35)

## 2016-04-25 MED ORDER — LACTULOSE 10 GM/15ML PO SOLN
30.0000 g | Freq: Once | ORAL | Status: AC
  Administered 2016-04-25: 30 g via ORAL
  Filled 2016-04-25: qty 60

## 2016-04-25 NOTE — ED Notes (Signed)
Patient transferred back to the ED Quad area for follow up on ammonia level.

## 2016-04-25 NOTE — ED Notes (Signed)
Helped pt. Call a friend, pt. Currently talking to a friend named Minerva Areolaric.

## 2016-04-25 NOTE — ED Notes (Signed)
Pt. Given her clothes and personal items.  Pt. Given a blue scrub top.

## 2016-04-25 NOTE — ED Provider Notes (Addendum)
 -----------------------------------------   7:47 AM on 04/25/2016 -----------------------------------------   Blood pressure (!) 151/89, pulse 68, temperature 98 F (36.7 C), temperature source Oral, resp. rate 16, height 5\' 6"  (1.676 m), weight 110 lb (49.9 kg), SpO2 99 %.  The patient had no acute events since last update.  Calm and cooperative at this time.  Disposition is pending Psychiatry/Behavioral Medicine team recommendations.     ----------------------------------------- 3:33 PM on 04/25/2016 -----------------------------------------  Patient here with the above complaints. Her demeanor at this time is calm and relaxed she does not appear to be at this time manic. She did have an elevated ammonia has a history of hepatitis C. I have given her lactulose. I discussed with Dr. Laural BenesJohnson, he does not feel she requires admission to the hospital this time but he does recommend repeat ammonia level which we will obtain. If that is reassuring, we'll have her reassessed by psychiatry as she has no ongoing complaints patient does not appear to be hallucinating and she is holding cultures which would make at this time although perhaps this is a waxing and waning pathology. No evidence of ongoing infection. Her function tests are normal. Unclear why she has a slight bump in her ammonia not certain if that's also contributory to her recent complaints. Signed out to Dr. Scotty CourtStafford while this is an ongoing evaluation   Jeanmarie PlantJames A Arthur Speagle, MD 04/25/16 1534

## 2016-04-25 NOTE — ED Notes (Signed)
Patient asleep in room. No noted distress or abnormal behavior. Will continue 15 minute checks and observation by security cameras for safety. 

## 2016-04-25 NOTE — ED Notes (Signed)
IVC p SOC 

## 2016-04-25 NOTE — ED Notes (Signed)
Pt requesting to go outside to smoke, explained to pt that she is IVCed at this time and we cant let here leave until the psych MD clears her. Pt upset that she is unable to go outside and now requesting that we give her back her belongings so she can walk out of here. RN notified.

## 2016-04-25 NOTE — ED Notes (Signed)
Patient talking with husband via telephone.

## 2016-04-25 NOTE — ED Notes (Signed)
Patient cooperative with nursing assessment but unable to answer most questions. She says she "can't remember." She does report noticing a decline in memory over time.  Patient denies any significant psychiatric symptoms. She denies SI or HI. Denies any hallucinations. She does not appear to be paranoid. Patient received meal tray and beverage. Maintained on 15 minute checks and observation by security camera for safety.

## 2016-04-25 NOTE — ED Notes (Signed)

## 2016-04-25 NOTE — ED Notes (Signed)
Per staff report [facility]

## 2016-04-25 NOTE — ED Provider Notes (Addendum)
-----------------------------------------   4:51 PM on 04/25/2016 -----------------------------------------   Blood pressure (!) 151/89, pulse 68, temperature 98 F (36.7 C), temperature source Oral, resp. rate 16, height 5\' 6"  (1.676 m), weight 110 lb (49.9 kg), SpO2 99 %.  The patient had no acute events since last update.  Repeat ammonia level is normal. Calm and cooperative at this time.  Pt is medically stable and does not have an identifiable organic cause for her psychiatric symptoms. We will obtain a repeat psych consult.  Disposition is pending Psychiatry/Behavioral Medicine team recommendations.     Sharman CheekPhillip Hattye Siegfried, MD 04/25/16 1652    ----------------------------------------- 9:19 PM on 04/25/2016 -----------------------------------------  Psych consult reviewed. Feel she is psychiatrically stable and symptoms have resolved. Suitable for outpatient follow-up. Remains medically stable. Patient is awake and alert and energetic and walk and talkative. We'll prepare discharge with follow-up instructions.    Sharman CheekPhillip Lashika Erker, MD 04/25/16 2120

## 2016-04-25 NOTE — ED Notes (Signed)
Pt. Up and using bathroom. 

## 2016-04-25 NOTE — ED Notes (Signed)
Pt. Husband called back to stated he arranged transportation of patient home.   Pt. Will be picked up in waiting room.  Pt. Has all belonging that she came in with.  Pt. Smoked in bathroom, just before discharge.

## 2016-04-25 NOTE — ED Notes (Addendum)
called by Sharrie RothmanEric Moisan, husband, [phone], called  "No medications in over one month or maybe two..., several deaths within the family with the last 2-3 years, two admissions to psych ward last year, diagnosed with major depression, PTSD (pt found father s/p suicide [facility]), anxiety, bipolar with mania, COPD, HTN, rheumatoid arthritis, Hepatitis C  Pharmacy, [facility], on [address]  [facility], pt of "some lady psychiatrist"  Pt hx of heroin IV use, smoking crack, and taking percocets

## 2016-04-25 NOTE — ED Notes (Signed)
Gave pt a blanket

## 2016-04-26 DIAGNOSIS — I10 Essential (primary) hypertension: Secondary | ICD-10-CM | POA: Diagnosis not present

## 2016-04-26 DIAGNOSIS — R4182 Altered mental status, unspecified: Secondary | ICD-10-CM | POA: Diagnosis not present

## 2016-04-26 DIAGNOSIS — Z880 Allergy status to penicillin: Secondary | ICD-10-CM | POA: Diagnosis not present

## 2016-04-26 DIAGNOSIS — R410 Disorientation, unspecified: Secondary | ICD-10-CM | POA: Diagnosis not present

## 2016-04-26 DIAGNOSIS — Z8619 Personal history of other infectious and parasitic diseases: Secondary | ICD-10-CM | POA: Diagnosis not present

## 2016-04-26 DIAGNOSIS — R69 Illness, unspecified: Secondary | ICD-10-CM | POA: Diagnosis not present

## 2016-04-26 LAB — THYROID PANEL WITH TSH
Free Thyroxine Index: 2.9 (ref 1.2–4.9)
T3 Uptake Ratio: 21 % — ABNORMAL LOW (ref 24–39)
T4, Total: 13.6 ug/dL — ABNORMAL HIGH (ref 4.5–12.0)
TSH: 5.57 u[IU]/mL — ABNORMAL HIGH (ref 0.450–4.500)

## 2016-04-26 LAB — URINE CULTURE: Culture: <10000 — AB

## 2016-04-26 LAB — RPR: RPR Ser Ql: NONREACTIVE

## 2016-04-29 ENCOUNTER — Telehealth: Payer: Self-pay | Admitting: Emergency Medicine

## 2016-04-29 NOTE — Telephone Encounter (Signed)
Called patient to assure she is aware of thyroid tests done in ED are abnormal.  She says she was not aware.  I gave the specific result values to her and she plans to give them to her doctor at [facility].  She is not sure who her doctor is there, but assures me she will advise them of the results.

## 2016-04-30 LAB — CULTURE, BLOOD (ROUTINE X 2)
CULTURE: NO GROWTH
Culture: NO GROWTH
SPECIAL REQUESTS: ADEQUATE
Special Requests: ADEQUATE

## 2016-05-01 ENCOUNTER — Encounter: Payer: Self-pay | Admitting: Emergency Medicine

## 2016-05-01 ENCOUNTER — Emergency Department
Admission: EM | Admit: 2016-05-01 | Discharge: 2016-05-03 | Disposition: A | Discharge status: Other Acute Inpatient Hospital | Payer: Medicare HMO | Attending: Emergency Medicine | Admitting: Emergency Medicine

## 2016-05-01 DIAGNOSIS — F17200 Nicotine dependence, unspecified, uncomplicated: Secondary | ICD-10-CM | POA: Diagnosis present

## 2016-05-01 DIAGNOSIS — Z046 Encounter for general psychiatric examination, requested by authority: Secondary | ICD-10-CM

## 2016-05-01 DIAGNOSIS — R451 Restlessness and agitation: Secondary | ICD-10-CM

## 2016-05-01 DIAGNOSIS — F3181 Bipolar II disorder: Secondary | ICD-10-CM | POA: Diagnosis present

## 2016-05-01 DIAGNOSIS — F1110 Opioid abuse, uncomplicated: Secondary | ICD-10-CM | POA: Diagnosis present

## 2016-05-01 DIAGNOSIS — Z79899 Other long term (current) drug therapy: Secondary | ICD-10-CM | POA: Insufficient documentation

## 2016-05-01 DIAGNOSIS — I10 Essential (primary) hypertension: Secondary | ICD-10-CM | POA: Insufficient documentation

## 2016-05-01 DIAGNOSIS — R4689 Other symptoms and signs involving appearance and behavior: Secondary | ICD-10-CM | POA: Diagnosis not present

## 2016-05-01 DIAGNOSIS — F319 Bipolar disorder, unspecified: Secondary | ICD-10-CM | POA: Insufficient documentation

## 2016-05-01 DIAGNOSIS — F17210 Nicotine dependence, cigarettes, uncomplicated: Secondary | ICD-10-CM | POA: Diagnosis not present

## 2016-05-01 DIAGNOSIS — R69 Illness, unspecified: Secondary | ICD-10-CM | POA: Diagnosis not present

## 2016-05-01 LAB — URINE DRUG SCREEN, QUALITATIVE (ARMC ONLY)
AMPHETAMINES, UR SCREEN: NOT DETECTED
BENZODIAZEPINE, UR SCRN: NOT DETECTED
Barbiturates, Ur Screen: NOT DETECTED
Cannabinoid 50 Ng, Ur ~~LOC~~: NOT DETECTED
Cocaine Metabolite,Ur ~~LOC~~: NOT DETECTED
MDMA (ECSTASY) UR SCREEN: NOT DETECTED
Methadone Scn, Ur: NOT DETECTED
Opiate, Ur Screen: POSITIVE — AB
PHENCYCLIDINE (PCP) UR S: NOT DETECTED
TRICYCLIC, UR SCREEN: NOT DETECTED

## 2016-05-01 MED ORDER — OLANZAPINE 10 MG PO TABS
10.0000 mg | ORAL_TABLET | Freq: Two times a day (BID) | ORAL | Status: DC
  Administered 2016-05-02 – 2016-05-03 (×4): 10 mg via ORAL
  Filled 2016-05-01 (×4): qty 1

## 2016-05-01 MED ORDER — LORAZEPAM 1 MG PO TABS
1.0000 mg | ORAL_TABLET | Freq: Two times a day (BID) | ORAL | Status: DC | PRN
Start: 2016-05-01 — End: 2016-05-03
  Administered 2016-05-02 (×2): 1 mg via ORAL
  Filled 2016-05-01 (×2): qty 1

## 2016-05-01 MED ORDER — HALOPERIDOL LACTATE 5 MG/ML IJ SOLN
2.0000 mg | Freq: Once | INTRAMUSCULAR | Status: AC
  Administered 2016-05-01: 2 mg via INTRAMUSCULAR
  Filled 2016-05-01: qty 1

## 2016-05-01 MED ORDER — LORAZEPAM 2 MG/ML IJ SOLN
1.0000 mg | Freq: Once | INTRAMUSCULAR | Status: AC
  Administered 2016-05-01: 1 mg via INTRAMUSCULAR
  Filled 2016-05-01: qty 1

## 2016-05-01 NOTE — ED Notes (Addendum)
Pt yelling in room, this RN in room for the last 15 min attempting to calm pt, pt with pressured speech, demanding cigarette, paranoid about ODS presence, unwilling to orient to rules of quad  Pt requesting to go to the "[facility]", pt request admission "like before" pt remembers this RN
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
## 2016-05-01 NOTE — ED Notes (Signed)
Pt is spitting on the floor, tearing things off the walls in sub wait and continues to curse at staff; as soon as I enter the sub wait area she calms down and sits in the recliner; wanting to go to a treatment and pt is reminded there is no room as of yet; charge nurse aware of pt's outbursts

## 2016-05-01 NOTE — ED Notes (Signed)
Pt is in room threatening to tear up the room and hurt someone if she cannot be out of sight of the officers. Pt states that she does not want to be here and claims that she will become violent if held against her will.

## 2016-05-01 NOTE — ED Provider Notes (Signed)
[facility] Emergency Department Provider Note   ____________________________________________   None    (approximate)  I have reviewed the triage vital signs and the nursing notes.   HISTORY  Chief Complaint Medical Clearance  Agitation  HPI Rachel [name] is a 61 y.o. female here again after recent ED evaluation now under involuntary commitment  Patient presents today, history of recent evaluation for aggressive behavior. An extensive ED workup a few days ago  EM caveat: The patient is very agitated, throwing items, cursing, asking for cigarette and refusing examination. Acting very agitated and aggressively towards anyone approaching her.  Today the patient stated that she wanted to die. Patient has been disoriented, unable to recognize family members, was imagining a friend who is been dead for 5 years. Also reportedly using prescription drugs and potentially crack cocaine.   Patient has evidently been experiencing altered mental status for 3 weeks. Does have a known history of hepatitis   Past Medical History:  Diagnosis Date  . Hepatitis C   . Hypertension     There are no active problems to display for this patient.   History reviewed. No pertinent surgical history.  Prior to Admission medications   Medication Sig Start Date End Date Taking? Authorizing Provider  HYDROXYZINE HCL PO Take by mouth.   Yes Historical Provider, MD  traZODone (DESYREL) 50 MG tablet Take 150 mg by mouth at bedtime.   Yes Historical Provider, MD  CarBAMazepine (TEGRETOL PO) Take by mouth.    Historical Provider, MD  clindamycin (CLEOCIN) 150 MG capsule Take 1 capsule (150 mg total) by mouth 4 (four) times daily. Patient not taking: Reported on 04/24/2016 01/25/16   Joni Reining, PA-C  ibuprofen (ADVIL,MOTRIN) 600 MG tablet Take 1 tablet (600 mg total) by mouth every 8 (eight) hours as needed. Patient not taking: Reported on 04/24/2016 01/25/16   Joni Reining,  PA-C  oxyCODONE-acetaminophen (PERCOCET) 7.5-325 MG tablet Take 1 tablet by mouth every 4 (four) hours as needed for severe pain. Patient not taking: Reported on 04/24/2016 01/25/16   Joni Reining, PA-C    Allergies Tetracyclines & related  History reviewed. No pertinent family history.  Social History Social History  Substance Use Topics  . Smoking status: Current Every Day Smoker    Packs/day: 1.00    Types: Cigarettes  . Smokeless tobacco: Never Used  . Alcohol use Yes     Comment: rarely    Review of Systems EM caveat ____________________________________________   PHYSICAL EXAM:  VITAL SIGNS: ED Triage Vitals  Enc Vitals Group     BP 05/01/16 1909 (!) 160/109     Pulse Rate 05/01/16 1909 80     Resp 05/01/16 1909 18     Temp 05/01/16 1909 98.3 F (36.8 C)     Temp Source 05/01/16 1909 Oral     SpO2 05/01/16 1909 95 %     Weight 05/01/16 1905 110 lb (49.9 kg)     Height 05/01/16 1905  (1.676 m)     Head Circumference --      Peak Flow --      Pain Score 05/01/16 1905 0     Pain Loc --      Pain Edu? --      Excl. in GC? --     Constitutional: Alert, disoriented, very aggressive. She is requesting a cigarette and wants to leave. She is spitting upon Tax adviser. Very combative when approached. Eyes: Conjunctivae are  normal. PERRL.  Head: Atraumatic. Nose: No congestion/rhinnorhea. Mouth/Throat: Mucous membranes are moist.   Neck: No stridor.  No meningismus Cardiovascular: Normal rate, regular rhythm. Grossly normal heart sounds.   Respiratory: Normal respiratory effort.  No retractions. Lungs CTAB. Neurologic:  Disoriented to self, knows she is at the hospital and states she will wishes to go to the "not hot". She is repetitively asking for cigarettes. She moves all extremities without any obvious deficits. Exhibits normal strength in all extremities. No facial droop is noted. Skin:  Skin is warm, dry and intact. No rash noted. Psychiatric:  Mood and affect are agitated, aggressive.  ____________________________________________   LABS (all labs ordered are listed, but only abnormal results are displayed)  Labs Reviewed  URINE DRUG SCREEN, QUALITATIVE ([facility] ONLY) - Abnormal; Notable for the following:       Result Value   Opiate, Ur Screen POSITIVE (*)    All other components within normal limits  COMPREHENSIVE METABOLIC PANEL  ETHANOL  SALICYLATE LEVEL  ACETAMINOPHEN LEVEL  CBC  AMMONIA  URINALYSIS, COMPLETE (UACMP) WITH MICROSCOPIC   ____________________________________________  EKG   ____________________________________________  RADIOLOGY  Reviewed old:  IMPRESSION: 1. No definite CT evidence for acute intracranial abnormality. 2. 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   Electronically Signed   By: Jasmine Pang M.D.   On: 04/24/2016 17:47  ____________________________________________   PROCEDURES  Procedure(s) performed: None  Procedures  Critical Care performed: No  ____________________________________________   INITIAL IMPRESSION / ASSESSMENT AND PLAN / ED COURSE  Pertinent labs & imaging results that were available during my care of the patient were reviewed by me and considered in my medical decision making (see chart for details).    Clinical Course as of May 02 2303  Fri May 01, 2016  2041 Patient extremely agitated, pulled a sign off the wall and supple weight, spitting at officers, yelling profanities. She seems very elevated and manic. Based on risk to self and staff, I have ordered doses of Haldol and Ativan for calm him. Of note, reviewed previous psychiatric consultation and very extensive medical workup on last evaluation less than one week ago which was largely negative for anything abnormal to suggest acute medical etiology at that time, however we'll repeat some specific tests today  [MQ]    2251 Called emergency contact (spouse) Minerva Areola, left message but no answer. Left voicemail to call ER back (no patient specific information was left).  [MQ]    Clinical Course User Index [MQ] Sharyn Creamer, MD    ----------------------------------------- 10:45 PM on 05/01/2016 -----------------------------------------  The patient has calm somewhat. She still agitated when talked to were approached, but is now resting comfortably in the chair when not directly examined. At this point, I reviewed her recent workup which was very extensive only a few days ago and does not demonstrate obvious medical etiology. This point, plan to reconsult psychiatry for further input regarding workup. Unclear though I suspect potentially a psychiatric cause and/or related to chronic memory impairment, feel far less likely this represents delirium. Possibly related to substance abuse and intoxication as well. Does not seem to demonstrate any symptoms that would suggest serotonin syndrome or neuroleptic malignant syndrome. No rigidity, no fever, symptoms seem to be ongoing for over 2-3 weeks now.  ____________________________________________  ----------------------------------------- 11:03 PM on 05/01/2016 -----------------------------------------  Old records are reviewed, the patient has had one previous admission to psychiatry in the past  and was diagnosed with bipolar 2 disorder after suicidal ideation was reported.  Ongoing care side Dr. Dolores Frame. Follow-up on medical labs which have been ordered, consultation and recommendations from psychiatry pending. Patient is under involuntary commitment at this time.  FINAL CLINICAL IMPRESSION(S) / ED DIAGNOSES  Final diagnoses:  Restlessness and agitation  Involuntary commitment      NEW MEDICATIONS STARTED DURING THIS VISIT:  New Prescriptions   No medications on file     Note:  This document was prepared using Dragon voice recognition software and may include  unintentional dictation errors.     Sharyn Creamer, MD 05/01/16 [phone]

## 2016-05-01 NOTE — ED Triage Notes (Addendum)
Pt says was discharged today from "[facility]" here at [facility]; returns tonight with IVC papers stating she is confused; voiced suicidal threats to her daughter on the phone; pt angry at officer that brought her in; says she's starving because she hasn't eaten since yesterday; "I'm starving to death"; while changing into our scrubs pt says if she's discharged home she is going to kill her husband

## 2016-05-01 NOTE — ED Notes (Addendum)
Pt allowed ED tech one chance to draw blood; unsuccessful; pt tolerated well; pt moved to subwait with Deputy Jamse Mead; belongings taken to Danelle Earthly, RN in quad area; pt will not sit in recliner because then she can see the deputy that brought her in and she does not care for him; pt sitting on floor in subwait under the tv, given sandwich tray as pt says she has not eaten today; deputy to stay with pt until treatment room becomes available

## 2016-05-01 NOTE — ED Notes (Signed)
Pt refusing blood draw in triage; says if she doesn't get a cigarette soon she's going to hurt someone; pt said she didn't need to void and it would "be hours" before we could get a specimen; then says she needs to void and has been taking to [facility] by ED tech Lafonda Mosses; pt cursing at officer that brought her in; says she's a nice person and then cursing at staff

## 2016-05-02 ENCOUNTER — Encounter: Payer: Self-pay | Admitting: Psychiatry

## 2016-05-02 DIAGNOSIS — F17200 Nicotine dependence, unspecified, uncomplicated: Secondary | ICD-10-CM | POA: Diagnosis present

## 2016-05-02 DIAGNOSIS — F3181 Bipolar II disorder: Secondary | ICD-10-CM | POA: Diagnosis present

## 2016-05-02 DIAGNOSIS — I10 Essential (primary) hypertension: Secondary | ICD-10-CM | POA: Diagnosis present

## 2016-05-02 DIAGNOSIS — F1110 Opioid abuse, uncomplicated: Secondary | ICD-10-CM | POA: Diagnosis present

## 2016-05-02 LAB — CBC
HCT: 42.1 % (ref 35.0–47.0)
Hemoglobin: 14.1 g/dL (ref 12.0–16.0)
MCH: 27.4 pg (ref 26.0–34.0)
MCHC: 33.6 g/dL (ref 32.0–36.0)
MCV: 81.4 fL (ref 80.0–100.0)
PLATELETS: 288 10*3/uL (ref 150–440)
RBC: 5.17 MIL/uL (ref 3.80–5.20)
RDW: 15.5 % — ABNORMAL HIGH (ref 11.5–14.5)
WBC: 9 10*3/uL (ref 3.6–11.0)

## 2016-05-02 LAB — COMPREHENSIVE METABOLIC PANEL WITH GFR
ALT: 19 U/L (ref 14–54)
AST: 22 U/L (ref 15–41)
Albumin: 3.6 g/dL (ref 3.5–5.0)
Alkaline Phosphatase: 109 U/L (ref 38–126)
Anion gap: 7 (ref 5–15)
BUN: 16 mg/dL (ref 6–20)
CO2: 27 mmol/L (ref 22–32)
Calcium: 8.6 mg/dL — ABNORMAL LOW (ref 8.9–10.3)
Chloride: 106 mmol/L (ref 101–111)
Creatinine, Ser: 0.85 mg/dL (ref 0.44–1.00)
GFR calc Af Amer: >60 mL/min (ref >60)
GFR calc non Af Amer: >60 mL/min (ref >60)
Glucose, Bld: 108 mg/dL — ABNORMAL HIGH (ref 65–99)
Potassium: 3.3 mmol/L — ABNORMAL LOW (ref 3.5–5.1)
Sodium: 140 mmol/L (ref 135–145)
Total Bilirubin: 0.5 mg/dL (ref 0.3–1.2)
Total Protein: 6.8 g/dL (ref 6.5–8.1)

## 2016-05-02 LAB — URINALYSIS, COMPLETE (UACMP) WITH MICROSCOPIC
Bacteria, UA: NONE SEEN
Bilirubin Urine: NEGATIVE
Glucose, UA: NEGATIVE mg/dL
Hgb urine dipstick: NEGATIVE
Ketones, ur: NEGATIVE mg/dL
Leukocytes, UA: NEGATIVE
Nitrite: NEGATIVE
Protein, ur: NEGATIVE mg/dL
Specific Gravity, Urine: 1.008 (ref 1.005–1.030)
pH: 6 (ref 5.0–8.0)

## 2016-05-02 LAB — ETHANOL

## 2016-05-02 LAB — ACETAMINOPHEN LEVEL: Acetaminophen (Tylenol), Serum: <10 ug/mL — ABNORMAL LOW (ref 10–30)

## 2016-05-02 LAB — AMMONIA: AMMONIA: 29 umol/L (ref 9–35)

## 2016-05-02 LAB — SALICYLATE LEVEL: Salicylate Lvl: <7 mg/dL (ref 2.8–30.0)

## 2016-05-02 MED ORDER — HYDROXYZINE HCL 25 MG PO TABS
ORAL_TABLET | ORAL | Status: AC
  Administered 2016-05-02: 50 mg
  Filled 2016-05-02: qty 2

## 2016-05-02 MED ORDER — HYDRALAZINE HCL 20 MG/ML IJ SOLN
10.0000 mg | Freq: Once | INTRAMUSCULAR | Status: AC
  Administered 2016-05-02: 10 mg via INTRAVENOUS
  Filled 2016-05-02: qty 1

## 2016-05-02 MED ORDER — HYDROXYZINE HCL 25 MG PO TABS
50.0000 mg | ORAL_TABLET | ORAL | Status: DC | PRN
  Administered 2016-05-03: 50 mg via ORAL
  Filled 2016-05-02: qty 2

## 2016-05-02 MED ORDER — IBUPROFEN 600 MG PO TABS
600.0000 mg | ORAL_TABLET | Freq: Once | ORAL | Status: AC
  Administered 2016-05-02: 600 mg via ORAL
  Filled 2016-05-02: qty 1

## 2016-05-02 MED ORDER — NICOTINE 21 MG/24HR TD PT24
21.0000 mg | MEDICATED_PATCH | Freq: Once | TRANSDERMAL | Status: DC
  Administered 2016-05-02: 21 mg via TRANSDERMAL
  Filled 2016-05-02: qty 1

## 2016-05-02 NOTE — ED Provider Notes (Signed)
-----------------------------------------   2:59 AM on 05/02/2016 -----------------------------------------  [facility] currently declines to accept patient secondary to her elevated blood pressure. Pharmacy tech unable to verify patient's dosage of hydralazine which patient takes at home. Will administer 10 mg IV bolus and continue to monitor.  ----------------------------------------- 6:46 AM on 05/02/2016 -----------------------------------------  Blood pressure improved after hydralazine and patient was moved to [facility] without further events. Disposition pending psychiatry.   Irean Hong, MD 05/02/16 [phone]

## 2016-05-02 NOTE — ED Notes (Signed)
Pt wanting to know the time and if her husband had come to visit.  RN told pt she could call her husband at 60. Pt accepting. Pt stated she wanted to go back to sleep. Maintained on 15 minute checks and observation by security camera for safety.

## 2016-05-02 NOTE — ED Notes (Signed)
Pt's husband called by RN for pt to speak with him.   Pt complains of pain "all over body."  ER MD notified. Medication administered as ordered.   Maintained on 15 minute checks and observation by security camera for safety.

## 2016-05-02 NOTE — ED Notes (Signed)
Reports to Minerva Areola, RN att

## 2016-05-02 NOTE — ED Notes (Signed)
Patient resting quietly in room. No noted distress or abnormal behaviors noted. Will continue 15 minute checks and observation by security camera for safety. 

## 2016-05-02 NOTE — ED Notes (Signed)
Patient asleep in room. No noted distress or abnormal behavior. Will continue 15 minute checks and observation by security cameras for safety. 

## 2016-05-02 NOTE — ED Notes (Signed)
Pt came to nurses station door asking if she would be going home today. RN told pt the psychiatrist would like to admit her to the hospital. Pt upset. "That's because that doctor doesn't like me. My dad is coming to get my stuff and now I'll have to live on the streets."  Pt returned to her room without an issue.   Maintained on 15 minute checks and observation by security camera for safety.

## 2016-05-02 NOTE — ED Notes (Signed)
Pt compliant with medication. Pt wanting to stay in bed and rest with head under the covers. When RN asked if pt knew where she was and what day it was pt responded "Of course I do." But did not offer any further information. Maintained on 15 minute checks and observation by security camera for safety.

## 2016-05-02 NOTE — ED Notes (Signed)
Pt is alert and oriented on admission to the [facility]. Pt mood is agitated but she is cooperative with staff. Writer provided fluids, additional blanket and prn medication. Pt denies SI/HI and AVH and states she just wants to return to sleep. 15 minute checks are ongoing for safety.

## 2016-05-03 ENCOUNTER — Inpatient Hospital Stay
Admission: AD | Admit: 2016-05-03 | Discharge: 2016-05-06 | DRG: 885 | Disposition: A | Discharge status: Home / Self Care | Payer: Medicare HMO | Source: Ambulatory Visit | Attending: Psychiatry | Admitting: Psychiatry

## 2016-05-03 DIAGNOSIS — I10 Essential (primary) hypertension: Secondary | ICD-10-CM | POA: Diagnosis present

## 2016-05-03 DIAGNOSIS — F1110 Opioid abuse, uncomplicated: Secondary | ICD-10-CM | POA: Diagnosis present

## 2016-05-03 DIAGNOSIS — Z9119 Patient's noncompliance with other medical treatment and regimen: Secondary | ICD-10-CM | POA: Diagnosis not present

## 2016-05-03 DIAGNOSIS — G4700 Insomnia, unspecified: Secondary | ICD-10-CM | POA: Diagnosis present

## 2016-05-03 DIAGNOSIS — F3181 Bipolar II disorder: Secondary | ICD-10-CM | POA: Diagnosis present

## 2016-05-03 DIAGNOSIS — F319 Bipolar disorder, unspecified: Secondary | ICD-10-CM | POA: Diagnosis present

## 2016-05-03 DIAGNOSIS — R69 Illness, unspecified: Secondary | ICD-10-CM | POA: Diagnosis not present

## 2016-05-03 DIAGNOSIS — F17200 Nicotine dependence, unspecified, uncomplicated: Secondary | ICD-10-CM | POA: Diagnosis present

## 2016-05-03 DIAGNOSIS — E538 Deficiency of other specified B group vitamins: Secondary | ICD-10-CM | POA: Diagnosis present

## 2016-05-03 DIAGNOSIS — Z8673 Personal history of transient ischemic attack (TIA), and cerebral infarction without residual deficits: Secondary | ICD-10-CM | POA: Diagnosis not present

## 2016-05-03 DIAGNOSIS — I679 Cerebrovascular disease, unspecified: Secondary | ICD-10-CM

## 2016-05-03 DIAGNOSIS — F17210 Nicotine dependence, cigarettes, uncomplicated: Secondary | ICD-10-CM | POA: Diagnosis present

## 2016-05-03 MED ORDER — CARBAMAZEPINE 200 MG PO TABS
200.0000 mg | ORAL_TABLET | Freq: Two times a day (BID) | ORAL | Status: DC
  Administered 2016-05-03 – 2016-05-06 (×6): 200 mg via ORAL
  Filled 2016-05-03 (×6): qty 1

## 2016-05-03 MED ORDER — HYDROXYZINE HCL 50 MG PO TABS
50.0000 mg | ORAL_TABLET | Freq: Three times a day (TID) | ORAL | Status: DC | PRN
  Administered 2016-05-03: 50 mg via ORAL
  Filled 2016-05-03: qty 1

## 2016-05-03 MED ORDER — MAGNESIUM HYDROXIDE 400 MG/5ML PO SUSP: 30.0000 mL | Freq: Every day | ORAL | Status: DC | PRN

## 2016-05-03 MED ORDER — OLANZAPINE 10 MG PO TABS
10.0000 mg | ORAL_TABLET | Freq: Every day | ORAL | Status: DC
  Administered 2016-05-03 – 2016-05-04 (×2): 10 mg via ORAL
  Filled 2016-05-03 (×2): qty 1

## 2016-05-03 MED ORDER — OLANZAPINE 10 MG PO TABS: 10.0000 mg | ORAL_TABLET | Freq: Two times a day (BID) | ORAL | Status: DC

## 2016-05-03 MED ORDER — TRAZODONE HCL 100 MG PO TABS
100.0000 mg | ORAL_TABLET | Freq: Every day | ORAL | Status: DC
  Administered 2016-05-03 – 2016-05-04 (×2): 100 mg via ORAL
  Filled 2016-05-03 (×2): qty 1

## 2016-05-03 MED ORDER — ALUM & MAG HYDROXIDE-SIMETH 200-200-20 MG/5ML PO SUSP
30.0000 mL | ORAL | Status: DC | PRN
  Administered 2016-05-06: 30 mL via ORAL
  Filled 2016-05-03: qty 30

## 2016-05-03 MED ORDER — ACETAMINOPHEN 325 MG PO TABS: 650.0000 mg | ORAL_TABLET | Freq: Four times a day (QID) | ORAL | Status: DC | PRN

## 2016-05-03 MED ORDER — NICOTINE 21 MG/24HR TD PT24
21.0000 mg | MEDICATED_PATCH | Freq: Every day | TRANSDERMAL | Status: DC
Start: 2016-05-03 — End: 2016-05-06
  Administered 2016-05-03 – 2016-05-06 (×5): 21 mg via TRANSDERMAL
  Filled 2016-05-03 (×6): qty 1

## 2016-05-03 NOTE — ED Notes (Signed)
Pt is IVC. Pt escorted to [facility]U with RN and officer. Belongings sent with patient. Pt accepting.

## 2016-05-03 NOTE — Progress Notes (Signed)
LCSW and TTS consulted this patient is going to [facility] inpatient no further needs   [employer] LCSW [phone]

## 2016-05-03 NOTE — [facility] Suicide Risk Assessment (Signed)
[facility] Admission Suicide Risk Assessment   Nursing information obtained from:    Demographic factors:    Current Mental Status:    Loss Factors:    Historical Factors:    Risk Reduction Factors:     Total Time spent with patient: 1 hour Principal Problem: Bipolar 2 disorder, major depressive episode (HCC) Diagnosis:   Patient Active Problem List   Diagnosis Date Noted  . Bipolar 2 disorder, major depressive episode (HCC) [F31.81] 05/02/2016  . Tobacco use disorder [F17.200] 05/02/2016  . Opioid use disorder, mild, abuse [F11.10] 05/02/2016  . HTN (hypertension) [I10] 05/02/2016   Subjective Data: depression, substance abuse.  Continued Clinical Symptoms:    The "Alcohol Use Disorders Identification Test", Guidelines for Use in Primary Care, Second Edition.  World Science writer [facility]). Score between 0-7:  no or low risk or alcohol related problems. Score between 8-15:  moderate risk of alcohol related problems. Score between 16-19:  high risk of alcohol related problems. Score 20 or above:  warrants further diagnostic evaluation for alcohol dependence and treatment.   CLINICAL FACTORS:   Bipolar Disorder:   Bipolar II Mixed State Alcohol/Substance Abuse/Dependencies   Musculoskeletal: Strength & Muscle Tone: within normal limits Gait & Station: normal Patient leans: N/A  Psychiatric Specialty Exam: Physical Exam  Nursing note and vitals reviewed. Psychiatric: Her speech is normal and behavior is normal. Thought content normal. Her mood appears anxious. Cognition and memory are normal. She expresses impulsivity. She exhibits a depressed mood.    Review of Systems  Psychiatric/Behavioral: Positive for substance abuse.  All other systems reviewed and are negative.   There were no vitals taken for this visit.There is no height or weight on file to calculate BMI.  General Appearance: Casual  Eye Contact:  Good  Speech:  Clear and Coherent  Volume:  Normal  Mood:   Anxious and Dysphoric  Affect:  Appropriate  Thought Process:  Goal Directed and Descriptions of Associations: Intact  Orientation:  Full (Time, Place, and Person)  Thought Content:  Delusions and Paranoid Ideation  Suicidal Thoughts:  No  Homicidal Thoughts:  No  Memory:  Immediate;   Fair Recent;   Fair Remote;   Fair  Judgement:  Impaired  Insight:  Lacking  Psychomotor Activity:  Normal  Concentration:  Concentration: Fair and Attention Span: Fair  Recall:  [employer] of Knowledge:  Fair  Language:  Fair  Akathisia:  No  Handed:  Right  AIMS (if indicated):     Assets:  Communication Skills Desire for Improvement Financial Resources/Insurance Housing Physical Health Resilience Social Support  ADL's:  Intact  Cognition:  WNL  Sleep:         COGNITIVE FEATURES THAT CONTRIBUTE TO RISK:  None    SUICIDE RISK:   Mild:  Suicidal ideation of limited frequency, intensity, duration, and specificity.  There are no identifiable plans, no associated intent, mild dysphoria and related symptoms, good self-control (both objective and subjective assessment), few other risk factors, and identifiable protective factors, including available and accessible social support.  PLAN OF CARE: Hospital admission, medication management, substance abuse counseling, discharge planning.  Ms. Nudelman is a 61 year old female with history of bipolar disorder and substance admitted for worsening of her symptoms in the context of treatment noncompliance.  1. Mood. We restarted Tegretol and Zyprexa for mood stabilization.  2. Insomnia. Trazodone is available.  3. Anxiety. Vistaril is available.  4. Smoking. Nicotine patch is available.  5. Opioid abuse. We will monitor  for symptoms of opiate withdrawal.  6. Metabolic syndrome monitoring. Lipid panel, TSH, hemoglobin A1c are pending.  7. EKG pending.  8. Disposition. She will be discharged home with her husband. She will follow up with  [facility].    I certify that inpatient services furnished can reasonably be expected to improve the patient's condition.   Kristine Linea, MD 05/03/2016, 2:17 PM

## 2016-05-03 NOTE — Plan of Care (Signed)
Problem: Education: Goal: Knowledge of [facility] Education information/materials will improve Outcome: Not Progressing Encourage patient  To come to staff for any  Concerns. Repeats  Herself

## 2016-05-03 NOTE — ED Notes (Signed)
Patient asleep in room. No noted distress or abnormal behavior. Will continue 15 minute checks and observation by security cameras for safety. 

## 2016-05-03 NOTE — ED Provider Notes (Signed)
-----------------------------------------   5:07 AM on 05/03/2016 -----------------------------------------   BP (!) 186/101 (BP Location: Left Arm)   Pulse 88   Temp 98.4 F (36.9 C) (Oral)   Resp 18   Ht  (1.676 m)   Wt 110 lb (49.9 kg)   SpO2 96%   BMI 17.75 kg/m   No acute events since last update.   Disposition is pending per Psychiatry/Behavioral Medicine team recommendations.     Phineas Semen, MD 05/03/16 [phone]

## 2016-05-03 NOTE — Progress Notes (Signed)
D; Admission Note:  D: Pt appeared depressed  With  a flat affect.  Pt  denies SI / AVH at this time. Patient  Talks low  And quick  You can't hear her . Patient stated she doesn't Know why she is here. Question where is her purse and cell phone  Instructed patient  She did not bring this . Lives with husband  Became acting bizarre  Husband  Committed her and her daughter. Positive for opioids , stated she had a problem with pills  Long time  Ago. Patient smokes . Voice of limited amount of ETOH. A:  Mental on the right side of neck   Pt admitted to unit per protocol, skin assessment and search done and no contraband found.  Pt  educated on therapeutic milieu rules. Pt was introduced to milieu by nursing staff.    R: Pt was receptive to education about the milieu .  15 min safety checks started. Clinical research associate offered support

## 2016-05-03 NOTE — H&P (Signed)
Psychiatric Admission Assessment Adult  Patient Identification: Rachel [name] MRN:  [id number] Date of Evaluation:  05/03/2016 Chief Complaint:  bipolar Principal Diagnosis: Bipolar 2 disorder, major depressive episode ([place]) Diagnosis:   Patient Active Problem List   Diagnosis Date Noted  . Bipolar 2 disorder, major depressive episode ([place]) [F31.81] 05/02/2016  . Tobacco use disorder [F17.200] 05/02/2016  . Opioid use disorder, mild, abuse [F11.10] 05/02/2016  . HTN (hypertension) [I10] 05/02/2016   History of Present Illness:    Identifying data. Mr. Kilmer is a 61 year old female with history of bipolar 2 disorder and substance abuse.  Chief complaint. "My husband put me here."  History of present illness. Information was obtained from the patient and the chart. The patient has a history of bipolar 2 disorder but has been off her medications for a month now. She became increasingly confused after she [place] her [place]. She reportedly was hallucinating, including visual hallucinations, and paranoid, moody and irritable likely abusing drugs. She was positive for opioids on admission. Her husband petition her on 2/23 for confusion and bizarre behavior. She was discharged to home by tele-psychiatrist. She returned 2 days later on petition from her daughter for agitation and was eventually admitted to [facility] for safety and stabilization. The patient denies any symptoms of depression, anxiety, or psychosis. She seems clueless about the reasons for her admission. She denies alcohol or illicit substance use.  Past psychiatric history. She was hospitalized twice it is at [facility] for depressive episodes. She remembers attempting suicide when she was young. She follows up with [facility] and Dr. Jamse Arn but has been off medications for over a month. The family suspects drug abuse. She did well in the past on a combination of  Risperdal and Tegretol.  Family psychiatric history. Her father and her cousin committed suicide.  Social history. She lives with her husband. Her daughter also lives in the area. They both took commitment papers on the patient.  Total Time spent with patient: 1 hour  Is the patient at risk to self? No.  Has the patient been a risk to self in the past 6 months? No.  Has the patient been a risk to self within the distant past? No.  Is the patient a risk to others? No.  Has the patient been a risk to others in the past 6 months? No.  Has the patient been a risk to others within the distant past? No.   Prior Inpatient Therapy:   Prior Outpatient Therapy:    Alcohol Screening:   Substance Abuse History in the last 12 months:  Yes.   Consequences of Substance Abuse: Negative Previous Psychotropic Medications: Yes  Psychological Evaluations: No  Past Medical History:  Past Medical History:  Diagnosis Date  . Hepatitis C   . Hypertension    No past surgical history on file. Family History: No family history on file.  Tobacco Screening:   Social History:  History  Alcohol Use  . Yes    Comment: rarely     History  Drug Use No    Additional Social History:                           Allergies:   Allergies  Allergen Reactions  . Tetracyclines & Related Rash   Lab Results:  Results for orders placed or performed during the hospital encounter of 05/01/16 (from  the past 48 hour(s))  Urine Drug Screen, Qualitative     Status: Abnormal   Collection Time: 05/01/16  7:13 PM  Result Value Ref Range   Tricyclic, Ur Screen NONE DETECTED NONE DETECTED   Amphetamines, Ur Screen NONE DETECTED NONE DETECTED   MDMA (Ecstasy)Ur Screen NONE DETECTED NONE DETECTED   Cocaine Metabolite,Ur [place] NONE DETECTED NONE DETECTED   Opiate, Ur Screen POSITIVE (A) NONE DETECTED   Phencyclidine (PCP) Ur S NONE DETECTED NONE DETECTED   Cannabinoid 50 Ng, Ur [place] NONE DETECTED NONE DETECTED    Barbiturates, Ur Screen NONE DETECTED NONE DETECTED   Benzodiazepine, Ur Scrn NONE DETECTED NONE DETECTED   Methadone Scn, Ur NONE DETECTED NONE DETECTED    Comment: (NOTE) 357  Tricyclics, urine               Cutoff 1000 ng/mL 200  Amphetamines, urine             Cutoff 1000 ng/mL 300  MDMA (Ecstasy), urine           Cutoff 500 ng/mL 400  Cocaine Metabolite, urine       Cutoff 300 ng/mL 500  Opiate, urine                   Cutoff 300 ng/mL 600  Phencyclidine (PCP), urine      Cutoff 25 ng/mL 700  Cannabinoid, urine              Cutoff 50 ng/mL 800  Barbiturates, urine             Cutoff 200 ng/mL 900  Benzodiazepine, urine           Cutoff 200 ng/mL 1000 Methadone, urine                Cutoff 300 ng/mL 1100 1200 The urine drug screen provides only a preliminary, unconfirmed 1300 analytical test result and should not be used for non-medical 1400 purposes. Clinical consideration and professional judgment should 1500 be applied to any positive drug screen result due to possible 1600 interfering substances. A more specific alternate chemical method 1700 must be used in order to obtain a confirmed analytical result.  1800 Gas chromato graphy / mass spectrometry (GC/MS) is the preferred 1900 confirmatory method.   Urinalysis, Complete w Microscopic     Status: Abnormal   Collection Time: 05/01/16  7:13 PM  Result Value Ref Range   Color, Urine YELLOW (A) YELLOW   APPearance CLEAR (A) CLEAR   Specific Gravity, Urine 1.008 1.005 - 1.030   pH 6.0 5.0 - 8.0   Glucose, UA NEGATIVE NEGATIVE mg/dL   Hgb urine dipstick NEGATIVE NEGATIVE   Bilirubin Urine NEGATIVE NEGATIVE   Ketones, ur NEGATIVE NEGATIVE mg/dL   Protein, ur NEGATIVE NEGATIVE mg/dL   Nitrite NEGATIVE NEGATIVE   Leukocytes, UA NEGATIVE NEGATIVE   RBC / HPF 0-5 0 - 5 RBC/hpf   WBC, UA 0-5 0 - 5 WBC/hpf   Bacteria, UA NONE SEEN NONE SEEN   Squamous Epithelial / LPF 0-5 (A) NONE SEEN   Mucous PRESENT   Comprehensive  metabolic panel     Status: Abnormal   Collection Time: 05/02/16 12:04 AM  Result Value Ref Range   Sodium 140 135 - 145 mmol/L   Potassium 3.3 (L) 3.5 - 5.1 mmol/L   Chloride 106 101 - 111 mmol/L   CO2 27 22 - 32 mmol/L   Glucose, Bld 108 (H) 65 - 99 mg/dL  BUN 16 6 - 20 mg/dL   Creatinine, Ser 0.85 0.44 - 1.00 mg/dL   Calcium 8.6 (L) 8.9 - 10.3 mg/dL   Total Protein 6.8 6.5 - 8.1 g/dL   Albumin 3.6 3.5 - 5.0 g/dL   AST 22 15 - 41 U/L   ALT 19 14 - 54 U/L   Alkaline Phosphatase 109 38 - 126 U/L   Total Bilirubin 0.5 0.3 - 1.2 mg/dL   GFR calc non Af Amer >60 >60 mL/min   GFR calc Af Amer >60 >60 mL/min    Comment: (NOTE) The eGFR has been calculated using the CKD EPI equation. This calculation has not been validated in all clinical situations. eGFR's persistently <60 mL/min signify possible Chronic Kidney Disease.    Anion gap 7 5 - 15  Ethanol     Status: None   Collection Time: 05/02/16 12:04 AM  Result Value Ref Range   Alcohol, Ethyl (B) <5 <5 mg/dL    Comment:        LOWEST DETECTABLE LIMIT FOR SERUM ALCOHOL IS 5 mg/dL FOR MEDICAL PURPOSES ONLY   Salicylate level     Status: None   Collection Time: 05/02/16 12:04 AM  Result Value Ref Range   Salicylate Lvl <2.9 2.8 - 30.0 mg/dL  Acetaminophen level     Status: Abnormal   Collection Time: 05/02/16 12:04 AM  Result Value Ref Range   Acetaminophen (Tylenol), Serum <10 (L) 10 - 30 ug/mL    Comment:        THERAPEUTIC CONCENTRATIONS VARY SIGNIFICANTLY. A RANGE OF 10-30 ug/mL MAY BE AN EFFECTIVE CONCENTRATION FOR MANY PATIENTS. HOWEVER, SOME ARE BEST TREATED AT CONCENTRATIONS OUTSIDE THIS RANGE. ACETAMINOPHEN CONCENTRATIONS >150 ug/mL AT 4 HOURS AFTER INGESTION AND >50 ug/mL AT 12 HOURS AFTER INGESTION ARE OFTEN ASSOCIATED WITH TOXIC REACTIONS.   cbc     Status: Abnormal   Collection Time: 05/02/16 12:04 AM  Result Value Ref Range   WBC 9.0 3.6 - 11.0 K/uL   RBC 5.17 3.80 - 5.20 MIL/uL   Hemoglobin 14.1  12.0 - 16.0 g/dL   HCT 42.1 35.0 - 47.0 %   MCV 81.4 80.0 - 100.0 fL   MCH 27.4 26.0 - 34.0 pg   MCHC 33.6 32.0 - 36.0 g/dL   RDW 15.5 (H) 11.5 - 14.5 %   Platelets 288 150 - 440 K/uL  Ammonia     Status: None   Collection Time: 05/02/16 12:04 AM  Result Value Ref Range   Ammonia 29 9 - 35 umol/L    Blood Alcohol level:  Lab Results  Component Value Date   ETH <5 05/02/2016   ETH <5 79/89/2119    Metabolic Disorder Labs:  No results found for: HGBA1C, MPG No results found for: PROLACTIN No results found for: CHOL, TRIG, HDL, CHOLHDL, VLDL, LDLCALC  Current Medications: Current Facility-Administered Medications  Medication Dose Route Frequency Provider Last Rate Last Dose  . acetaminophen (TYLENOL) tablet 650 mg  650 mg Oral Q6H PRN Corona Popovich B Verble Styron, MD      . alum & mag hydroxide-simeth (MAALOX/MYLANTA) 200-200-20 MG/5ML suspension 30 mL  30 mL Oral Q4H PRN Olyvia Gopal B Rayen Palen, MD      . carbamazepine (TEGRETOL) tablet 200 mg  200 mg Oral BID AC & HS Detrice Cales B Jaqwan Wieber, MD      . hydrOXYzine (ATARAX/VISTARIL) tablet 50 mg  50 mg Oral TID PRN Torrell Krutz B Habeeb Puertas, MD      . magnesium hydroxide (MILK OF  MAGNESIA) suspension 30 mL  30 mL Oral Daily PRN Stephenie Navejas B Octaviano Mukai, MD      . nicotine (NICODERM CQ - dosed in mg/24 hours) patch 21 mg  21 mg Transdermal Daily Baby Gieger B Bostyn Bogie, MD      . OLANZapine (ZYPREXA) tablet 10 mg  10 mg Oral QHS Roark Rufo B Mykel Mohl, MD      . traZODone (DESYREL) tablet 100 mg  100 mg Oral QHS Prem Coykendall B Rosary Filosa, MD       PTA Medications: No prescriptions prior to admission.    Musculoskeletal: Strength & Muscle Tone: within normal limits Gait & Station: normal Patient leans: N/A  Psychiatric Specialty Exam: Physical Exam  Nursing note and vitals reviewed. Constitutional: She is oriented to person, place, and time. She appears well-developed and well-nourished.  HENT:  Head: Normocephalic and atraumatic.  Eyes: Conjunctivae  and EOM are normal. Pupils are equal, round, and reactive to light.  Neck: Normal range of motion. Neck supple.  Cardiovascular: Normal rate, regular rhythm and normal heart sounds.   Respiratory: Effort normal and breath sounds normal.  GI: Soft. Bowel sounds are normal.  Musculoskeletal: Normal range of motion.  Neurological: She is alert and oriented to person, place, and time.  Skin: Skin is warm and dry.  Psychiatric: Her speech is normal and behavior is normal. Her mood appears anxious. Her affect is labile. Thought content is paranoid and delusional. Cognition and memory are normal. She expresses impulsivity.    Review of Systems  Psychiatric/Behavioral: Positive for substance abuse.  All other systems reviewed and are negative.   There were no vitals taken for this visit.There is no height or weight on file to calculate BMI.  See SRA.                                                  Sleep:       Treatment Plan Summary: Daily contact with patient to assess and evaluate symptoms and progress in treatment and Medication management   Ms. Sheeran is a 61 year old female with history of bipolar disorder and substance admitted for worsening of her symptoms in the context of treatment noncompliance.  1. Mood. We restarted Tegretol and Zyprexa for mood stabilization.  2. Insomnia. Trazodone is available.  3. Anxiety. Vistaril is available.  4. Smoking. Nicotine patch is available.  5. Opioid abuse. We will monitor for symptoms of opiate withdrawal.  6. Metabolic syndrome monitoring. Lipid panel, TSH, hemoglobin A1c are pending.  7. EKG pending.  8. Disposition. She will be discharged home with her husband. She will follow up with [facility].    Observation Level/Precautions:  15 minute checks  Laboratory:  CBC Chemistry Profile UDS UA  Psychotherapy:    Medications:    Consultations:    Discharge Concerns:    Estimated LOS:  Other:     Physician  Treatment Plan for Primary Diagnosis: Bipolar 2 disorder, major depressive episode ([place]) Long Term Goal(s): Improvement in symptoms so as ready for discharge  Short Term Goals: Ability to identify changes in lifestyle to reduce recurrence of condition will improve, Ability to verbalize feelings will improve, Ability to disclose and discuss suicidal ideas, Ability to demonstrate self-control will improve, Ability to identify and develop effective coping behaviors will improve, Ability to maintain clinical measurements within normal limits will improve, Compliance with prescribed medications will  improve and Ability to identify triggers associated with substance abuse/mental health issues will improve  Physician Treatment Plan for Secondary Diagnosis: Principal Problem:   Bipolar 2 disorder, major depressive episode ([place]) Active Problems:   Tobacco use disorder   Opioid use disorder, mild, abuse   HTN (hypertension)  Long Term Goal(s): Improvement in symptoms so as ready for discharge  Short Term Goals: Ability to identify changes in lifestyle to reduce recurrence of condition will improve, Ability to demonstrate self-control will improve and Ability to identify triggers associated with substance abuse/mental health issues will improve  I certify that inpatient services furnished can reasonably be expected to improve the patient's condition.    Orson Slick, MD 4/1/20182:25 PM

## 2016-05-03 NOTE — Tx Team (Signed)
Initial Treatment Plan 05/03/2016 4:05 PM [name] ZOX:[id number]    PATIENT STRESSORS: Marital or family conflict Medication change or noncompliance Substance abuse   PATIENT STRENGTHS: Active sense of humor Communication skills Supportive family/friends   PATIENT IDENTIFIED PROBLEMS: Suicidal 05/03/16  Depression 05/03/16  Psychosis 05/03/16                 DISCHARGE CRITERIA:  Ability to meet basic life and health needs Improved stabilization in mood, thinking, and/or behavior Motivation to continue treatment in a less acute level of care  PRELIMINARY DISCHARGE PLAN: Outpatient therapy Return to previous living arrangement  PATIENT/FAMILY INVOLVEMENT: This treatment plan has been presented to and reviewed with the patient, Rachel [name], and/or family member,  The patient and family have been given the opportunity to ask questions and make suggestions.  Crist Infante, RN 05/03/2016, 4:05 PM

## 2016-05-03 NOTE — ED Notes (Signed)
ER MD notified of pt's blood pressure. No orders at this time.

## 2016-05-03 NOTE — ED Notes (Signed)
Pt pacing between her room and dayroom. RN has explained to pt she will be admitted to BMU. Pt is not pleased, but accepting. Pt wanting to leave this unit because she is cold and wants to see outside. Maintained on 15 minute checks and observation by security camera for safety.

## 2016-05-03 NOTE — Progress Notes (Addendum)
Pt states she doesn't know why she is here. Presents frequently to nurses station asking same questions repeatedly. Will also go to dayroom and ask same questions to staff that she had just asked Clinical research associate. Requires frequent directions to room, gets disoriented on unit. Film/video editor to room and then states "Get the hell out". Frequently looking out of room and rolling eyes at staff. Restless. States it is "too quiet". Pt given second nicotine patch after she was became adamant that she had not received the documented nicotine patch at 4pm. Pt took shirt off in med room exposing self.  Encouragement and support provided. Safety maintained. Will continue to monitor.

## 2016-05-03 NOTE — ED Notes (Signed)
Pt given breakfast tray

## 2016-05-03 NOTE — ED Notes (Signed)
Pt spoke on phone with her husband. Pt then came to nurses station requesting a cigarette. RN told pt she had been given a nicotine patch and could not smoke in the hospital. Pt irritated and returned to her room.  Maintained on 15 minute checks and observation by security camera for safety.

## 2016-05-03 NOTE — ED Notes (Signed)
Pt's husband called to speak with RN and pt. Husband would like to be called when pt moves to BMU.

## 2016-05-03 NOTE — [facility] Suicide Risk Assessment (Signed)
[facility] INPATIENT:  Family/Significant Other Suicide Prevention Education  Suicide Prevention Education:  Contact Attempts:Eric Aponte(husband [phone]), has been identified by the patient as the family member/significant other with whom the patient will be residing, and identified as the person(s) who will aid the patient in the event of a mental health crisis.  With written consent from the patient, two attempts were made to provide suicide prevention education, prior to and/or following the patient's discharge.  We were unsuccessful in providing suicide prevention education.  A suicide education pamphlet was given to the patient to share with family/significant other.  Date and time of first attempt: 05/03/2016 / 3:40pm; No answer, CSW  left HIPAA compliant voicemail requesting returned call.   Ozella Comins G. Garnette Czech MSW, LCSWA 05/03/2016, 3:50 PM

## 2016-05-03 NOTE — BH Assessment (Addendum)
Per [facility], patient meets inpatient criteria.   Writer spoke with [facility] Attending Physician, Dr. Jennet Maduro and she will put in admissions orders.    Patient is to be admitted to [facility] by Dr. Jennet Maduro.  Attending Physician will be Dr. Jennet Maduro.   Patient has been assigned to room 301, by [facility] Charge Nurse Rich[place] F.   Intake Paper Work has been signed and placed on patient chart.  ER staff is aware of the admission Irving Burton, ER Sect.; Amy B., Patient's Nurse & Clint Lipps, Patient Access).

## 2016-05-03 NOTE — [facility] Counselor (Signed)
Adult Comprehensive Assessment  Patient ID: Rachel [name], female   DOB: [birth year], 61 y.o.   MRN: [id number]  Information Source: Information source: Patient  Current Stressors:  Educational / Learning stressors: n/a Employment / Job issues: Pt is on disability since 2016. Family Relationships: n/a Surveyor, quantity / Lack of resources (include bankruptcy): n/a Housing / Lack of housing: n/a Physical health (include injuries & life threatening diseases): Knee pain Social relationships: n/a Substance abuse: Patient denies Bereavement / Loss: n/a  Living/Environment/Situation:  Living Arrangements: Spouse/significant other Living conditions (as described by patient or guardian): Pt states "It's okay" How long has patient lived in current situation?: About 1 year What is atmosphere in current home: Comfortable, Loving, Supportive  Family History:  Marital status: Married Number of Years Married: 32 What types of issues is patient dealing with in the relationship?: n/a Additional relationship information: n/a Are you sexually active?: Yes What is your sexual orientation?: heterosexual Has your sexual activity been affected by drugs, alcohol, medication, or emotional stress?: n/a Does patient have children?: Yes How many children?: 1 How is patient's relationship with their children?: 1 daughter  Childhood History:  By whom was/is the patient raised?: Both parents Additional childhood history information: Pt states her parents divorced when she was 21 but not sure why. Description of patient's relationship with caregiver when they were a child: Pt states "It was fine".  Patient's description of current relationship with people who raised him/her: Both parents are deceased How were you disciplined when you got in trouble as a child/adolescent?: n/a Does patient have siblings?: Yes Number of Siblings: 3 Description of patient's current relationship with siblings: 2 sisters and 1  brother. Patient reports they have an okay relationship.  Did patient suffer any verbal/emotional/physical/sexual abuse as a child?: No Did patient suffer from severe childhood neglect?: No Has patient ever been sexually abused/assaulted/raped as an adolescent or adult?: No Was the patient ever a victim of a crime or a disaster?: No Witnessed domestic violence?: No Has patient been effected by domestic violence as an adult?: No  Education:  Highest grade of school patient has completed: 12 th Currently a student?: No Name of school: n/a Learning disability?: No  Employment/Work Situation:   Employment situation: On disability Why is patient on disability: Medical reason How long has patient been on disability: about 2 years Patient's job has been impacted by current illness: No What is the longest time patient has a held a job?: 12 years Where was the patient employed at that time?: 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  Has patient ever been in the [employer]?: No Has patient ever served in combat?: No Did You Receive Any Psychiatric Treatment/Services While in the [employer]?: No Are There Guns or Other Weapons in Your Home?: No Are These Weapons Safely Secured?:  (n/a)  Financial Resources:   Financial resources: Insurance claims handler, Medicare Does patient have a Lawyer or guardian?: No  Alcohol/Substance Abuse:   What has been your use of drugs/alcohol within the last 12 months?: Patient denies If attempted suicide, did drugs/alcohol play a role in this?: No Alcohol/Substance Abuse Treatment Hx: Denies past history If yes, describe treatment: n/a Has alcohol/substance abuse ever caused legal problems?: No  Social Support System:   Patient's Community Support System: Poor Describe Community Support System: Patient states she does not support from family. Type of faith/religion: n/a How does patient's faith help to cope with current illness?: n/a  Leisure/Recreation:    Leisure and Hobbies: Spend  time with dog  Strengths/Needs:   What things does the patient do well?: "I sleep well" In what areas does patient struggle / problems for patient: depressed, anxiety, martial issues  Discharge Plan:   Does patient have access to transportation?: Yes Will patient be returning to same living situation after discharge?: Yes Currently receiving community mental health services: No If no, would patient like referral for services when discharged?: Yes (What county?) [facility]) Does patient have financial barriers related to discharge medications?: No  Summary/Recommendations:   Patient is a 61 year old female admitted involuntarily with a diagnosis of Bipolar 2 disorder, major depressive episode. Information was obtained from psychosocial assessment completed with patient and chart review conducted by this evaluator. Patient presented to the hospital IVC by her husband and daughter. Primary triggers for admission were medication non-compliance, confusion, and substance abuse. Patient follows-up with [facility] for outpatient services. Patient will benefit from crisis stabilization, medication evaluation, group therapy and psycho education in addition to case management for discharge. At discharge, it is recommended that patient remain compliant with established discharge plan and continued treatment.   Rachel [name] Czech MSW, LCSWA 05/03/2016 3:50 PM

## 2016-05-03 NOTE — ED Notes (Signed)
Pt becoming agitated because she is unable to smoke a cigarette. PRN medication administered as ordered.

## 2016-05-04 ENCOUNTER — Encounter: Payer: Self-pay | Admitting: Psychiatry

## 2016-05-04 DIAGNOSIS — I679 Cerebrovascular disease, unspecified: Secondary | ICD-10-CM

## 2016-05-04 LAB — LIPID PANEL
Cholesterol: 210 mg/dL — ABNORMAL HIGH (ref 0–200)
HDL: 53 mg/dL (ref >40)
LDL CALC: 139 mg/dL — AB (ref 0–99)
TRIGLYCERIDES: 89 mg/dL (ref <150)
Total CHOL/HDL Ratio: 4 RATIO
VLDL: 18 mg/dL (ref 0–40)

## 2016-05-04 LAB — TSH: TSH: 1.838 u[IU]/mL (ref 0.350–4.500)

## 2016-05-04 MED ORDER — RISPERIDONE 1 MG PO TABS
1.0000 mg | ORAL_TABLET | Freq: Once | ORAL | Status: AC
  Administered 2016-05-04: 1 mg via ORAL
  Filled 2016-05-04: qty 1

## 2016-05-04 MED ORDER — HALOPERIDOL LACTATE 5 MG/ML IJ SOLN: 2.0000 mg | Freq: Once | INTRAMUSCULAR | Status: DC

## 2016-05-04 MED ORDER — HYDROCHLOROTHIAZIDE 12.5 MG PO CAPS
12.5000 mg | ORAL_CAPSULE | Freq: Every day | ORAL | Status: DC
  Administered 2016-05-04 – 2016-05-06 (×3): 12.5 mg via ORAL
  Filled 2016-05-04 (×3): qty 1

## 2016-05-04 NOTE — Plan of Care (Signed)
Problem: Activity: Goal: Will verbalize the importance of balancing activity with adequate rest periods Outcome: Not Progressing Pt continues to be confused.

## 2016-05-04 NOTE — Progress Notes (Signed)
Recreation Therapy Notes  Date: 04.02.18 Time: 9:30 am Location: Craft Room  Group Topic: Self-expression  Goal Area(s) Addresses:  Patient will identify one color per emotion listed on wheel. Patient will verbalize benefit of using art as a means of self-expression. Patient will verbalize one emotion experienced during session. Patient will be educated on other forms of self-expression.  Behavioral Response: Attentive, Interactive  Intervention: Emotion Wheel  Activity: Patients were given an Arboriculturist and were instructed to pick a color for each emotion listed on the worksheet.  Education: LRT educated patients on other forms of self-expression.  Education Outcome: Acknowledges education/In group clarification offered  Clinical Observations/Feedback: Patient picked a color for each emotion. Patient contributed to group discussion by stating some of the colors she picked, and what emotions she felt during group.  Jacquelynn Cree, LRT/CTRS 05/04/2016 10:05 AM

## 2016-05-04 NOTE — Progress Notes (Signed)
Recreation Therapy Notes  INPATIENT RECREATION THERAPY ASSESSMENT  Patient Details Name: Rachel [name] MRN: [id number] DOB: 12/06/1955 Today's Date: 05/04/2016  Patient Stressors: Other (Comment) (Life in general)  Coping Skills:   Arguments, Substance Abuse, Art/Dance, Talking, Music, Other (Comment) (Cry)  Personal Challenges: Communication, Concentration, Decision-Making, Expressing Yourself, Self-Esteem/Confidence, Social Interaction, Stress Management, Time Management, Trusting Others  Leisure Interests (2+):  Individual - Other (Comment) (Read, cross stitching)  Awareness of Community Resources:  No  Community Resources:     Current Use:    If no, Barriers?:    Patient Strengths:  "No not today"  Patient Identified Areas of Improvement:  Everything  Current Recreation Participation:  "I have no idea"  Patient Goal for Hospitalization:  To go home  [place] of Residence:  [place] of Residence:  [place]   Current SI (including self-harm):  No  Current HI:  No  Consent to Intern Participation: N/A   Jacquelynn Cree, LRT/CTRS 05/04/2016, 3:17 PM

## 2016-05-04 NOTE — Progress Notes (Signed)
[facility] MD Progress Note  05/04/2016 1:36 PM Rachel [name]  MRN:  [id number] Subjective:   61 year old female with history of bipolar 2 disorder and substance abuse.  "My husband put me here."  The patient has a history of bipolar 2 disorder but has been off her medications for a month now. She became increasingly confused after she hit her head on a metal beam [place]. She reportedly was hallucinating, including visual hallucinations, and paranoid, moody and irritable likely abusing drugs. She was positive for opioids on admission. Her husband petition her on 2/23 for confusion and bizarre behavior. She was discharged to home by tele-psychiatrist. She returned 2 days later on petition from her daughter for agitation and was eventually admitted to [facility] for safety and stabilization. The patient denies any symptoms of depression, anxiety, or psychosis. She seems clueless about the reasons for her admission. She denies alcohol or illicit substance use.  05/04/16 patient complains of feeling very "edgy". He says that she is craving a cigarette and that they nicotine patch is not really helping her. She is unable to tell me why she is in the hospital. She denies today having problems with suicidality, homicidality or auditory or visual hallucinations. The patient says that in the past she used to abuse opiates pills and heroine. She says she cannot remember when the last use was doesn't think she used recently. She does not think she is in withdrawals. Patient denies having any side effects from medications or any major physical complaints.  Per nursing staff she continues to be confused and gets lost frequently around the unit   Per nursing: Patient confused, pt unable to locate room. Frequent redirection needed. Medication and group compliant. Irritable at times. Visible in milieu. Denies SI, HI, AVH.  Encouragement and support offered. Safety checks maintained.  Medications given as prescribed. Pt remains safe on unit.  Pt states she doesn't know why she is here. Presents frequently to nurses station asking same questions repeatedly. Will also go to dayroom and ask same questions to staff that she had just asked Clinical research associate. Requires frequent directions to room, gets disoriented on unit. Film/video editor to room and then states "Get the hell out". Frequently looking out of room and rolling eyes at staff. Restless. States it is "too quiet". Pt given second nicotine patch after she was became adamant that she had not received the documented nicotine patch at 4pm. Pt took shirt off in med room exposing self.  Encouragement and support provided. Safety maintained. Will continue to monitor.  Principal Problem: Bipolar 2 disorder, major depressive episode (HCC) Diagnosis:   Patient Active Problem List   Diagnosis Date Noted  . Cerebrovascular disease [I67.9] 05/04/2016  . Acute delirium [R41.0] 05/04/2016  . Opioid use disorder, severe, dependence (HCC) [F11.20] 05/04/2016  . Bipolar 2 disorder, major depressive episode (HCC) [F31.81] 05/02/2016  . Tobacco use disorder [F17.200] 05/02/2016  . Opioid use disorder, mild, abuse [F11.10] 05/02/2016  . HTN (hypertension) [I10] 05/02/2016   Total Time spent with patient: 30 minutes  Past Psychiatric History: She was hospitalized twice it is at [facility] for depressive episodes. She remembers attempting suicide when she was young. She follows up with [facility] and Dr. Georjean Mode but has been off medications for over a month. The family suspects drug abuse. She did well in the past on a combination of Risperdal and Tegretol.  Past Medical History:  Past Medical History:  Diagnosis Date  .  Hepatitis C   . Hypertension    History reviewed. No pertinent surgical history.  Family History: History reviewed. No pertinent family history.  Family Psychiatric  History: Her father and her cousin committed  suicide.  Social History: She lives with her husband. Her daughter also lives in the area. They both took commitment papers on the patient. History  Alcohol Use  . Yes    Comment: rarely     History  Drug Use No    Social History   Social History  . Marital status: Married    Spouse name: N/A  . Number of children: N/A  . Years of education: N/A   Social History Main Topics  . Smoking status: Current Every Day Smoker    Packs/day: 1.00    Types: Cigarettes  . Smokeless tobacco: Never Used  . Alcohol use Yes     Comment: rarely  . Drug use: No  . Sexual activity: Not Asked   Other Topics Concern  . None   Social History Narrative  . None    Current Medications: Current Facility-Administered Medications  Medication Dose Route Frequency Provider Last Rate Last Dose  . acetaminophen (TYLENOL) tablet 650 mg  650 mg Oral Q6H PRN Jolanta B Pucilowska, MD      . alum & mag hydroxide-simeth (MAALOX/MYLANTA) 200-200-20 MG/5ML suspension 30 mL  30 mL Oral Q4H PRN Jolanta B Pucilowska, MD      . carbamazepine (TEGRETOL) tablet 200 mg  200 mg Oral BID AC & HS Jolanta B Pucilowska, MD   200 mg at 05/04/16 0800  . magnesium hydroxide (MILK OF MAGNESIA) suspension 30 mL  30 mL Oral Daily PRN Jolanta B Pucilowska, MD      . nicotine (NICODERM CQ - dosed in mg/24 hours) patch 21 mg  21 mg Transdermal Daily Jolanta B Pucilowska, MD   21 mg at 05/04/16 0800  . OLANZapine (ZYPREXA) tablet 10 mg  10 mg Oral QHS Shari Prows, MD   10 mg at 05/03/16 2121  . traZODone (DESYREL) tablet 100 mg  100 mg Oral QHS Shari Prows, MD   100 mg at 05/03/16 2121    Lab Results:  Results for orders placed or performed during the hospital encounter of 05/03/16 (from the past 48 hour(s))  Lipid panel     Status: Abnormal   Collection Time: 05/04/16  7:02 AM  Result Value Ref Range   Cholesterol 210 (H) 0 - 200 mg/dL   Triglycerides 89 <161 mg/dL   HDL 53 >09 mg/dL   Total CHOL/HDL  Ratio 4.0 RATIO   VLDL 18 0 - 40 mg/dL   LDL Cholesterol 604 (H) 0 - 99 mg/dL    Comment:        Total Cholesterol/HDL:CHD Risk Coronary Heart Disease Risk Table                     Men   Women  1/2 Average Risk   3.4   3.3  Average Risk       5.0   4.4  2 X Average Risk   9.6   7.1  3 X Average Risk  23.4   11.0        Use the calculated Patient Ratio above and the CHD Risk Table to determine the patient's CHD Risk.        ATP III CLASSIFICATION (LDL):  <100     mg/dL   Optimal  540-981  mg/dL  Near or Above                    Optimal  130-159  mg/dL   Borderline  409-811  mg/dL   High  >914     mg/dL   Very High   TSH     Status: None   Collection Time: 05/04/16  7:02 AM  Result Value Ref Range   TSH 1.838 0.350 - 4.500 uIU/mL    Comment: Performed by a 3rd Generation assay with a functional sensitivity of <=0.01 uIU/mL.    Blood Alcohol level:  Lab Results  Component Value Date   ETH <5 05/02/2016   ETH <5 04/24/2016    Metabolic Disorder Labs: No results found for: HGBA1C, MPG No results found for: PROLACTIN Lab Results  Component Value Date   CHOL 210 (H) 05/04/2016   TRIG 89 05/04/2016   HDL 53 05/04/2016   CHOLHDL 4.0 05/04/2016   VLDL 18 05/04/2016   LDLCALC 139 (H) 05/04/2016    Physical Findings: AIMS:  , ,  ,  ,    CIWA:    COWS:     Musculoskeletal: Strength & Muscle Tone: within normal limits Gait & Station: normal Patient leans: N/A  Psychiatric Specialty Exam: Physical Exam  Constitutional: She is oriented to person, place, and time. She appears well-developed and well-nourished.  HENT:  Head: Normocephalic and atraumatic.  Eyes: Conjunctivae and EOM are normal.  Neck: Normal range of motion.  Respiratory: Effort normal.  Musculoskeletal: Normal range of motion.  Neurological: She is alert and oriented to person, place, and time.    Review of Systems  Constitutional: Negative.   HENT: Negative.   Eyes: Negative.    Respiratory: Negative.   Cardiovascular: Negative.   Gastrointestinal: Negative.   Genitourinary: Negative.   Musculoskeletal: Positive for joint pain.  Skin: Negative.   Neurological: Negative.   Endo/Heme/Allergies: Negative.   Psychiatric/Behavioral: Positive for substance abuse. Negative for depression, hallucinations, memory loss and suicidal ideas. The patient is not nervous/anxious and does not have insomnia.     Blood pressure (!) 147/92, pulse 98, temperature 98.9 F (37.2 C), resp. rate 18, height  (1.702 m), weight 49.9 kg (110 lb), SpO2 100 %.Body mass index is 17.23 kg/m.  General Appearance: Fairly Groomed  Eye Contact:  Good  Speech:  Clear and Coherent  Volume:  Normal  Mood:  Irritable  Affect:  Constricted  Thought Process:  Linear and Descriptions of Associations: Intact  Orientation:  Full (Time, Place, and Person)  Thought Content:  Hallucinations: None  Suicidal Thoughts:  No  Homicidal Thoughts:  No  Memory:  Immediate;   Poor Recent;   Poor Remote;   Fair  Judgement:  Poor  Insight:  Shallow  Psychomotor Activity:  Normal  Concentration:  Concentration: Fair and Attention Span: Fair  Recall:  [employer] of Knowledge:  Fair  Language:  Good  Akathisia:  No  Handed:    AIMS (if indicated):     Assets:  Physical Health Social Support  ADL's:  Intact  Cognition:  Impaired,  Mild  Sleep:  Number of Hours: 4.45     Treatment Plan Summary:  Rachel [name] is a 61 year old female with history of bipolar disorder and substance admitted for worsening of her symptoms in the context of treatment noncompliance.  Delirium:  Upon arrival to the emergency department the patient was combative and disoriented  Bipolar d/o (history of): We restarted Tegretol and Zyprexa  for mood stabilization. Will need a tegretol level in about 5 days.  Insomnia: continue trazodone 100 mg po qhs  Smoking: continue nicotine patch /d  Opioid abuse: pt denies  symptoms of withdrawals  Cerebrovascular disease: old lacunars infarcts.  HTN: will start HCTZ 12.5 mg  Metabolic syndrome monitoring. Lipid panel (elevated total cholesterol at 210, elevated LDL at 139), TSH (1.8 wnl), hemoglobin A1c is pending  EKG: QTC 449. Nl sinus rhythm   Head CT:  1. No definite CT evidence for acute intracranial abnormality. 2. Old appearing lacunar infarcts in the left thalamus and basalganglia but new compared with comparison CT from 2009.   Disposition. She will be discharged home with her husband. She will follow up with [facility].   Jimmy Footman, MD 05/04/2016, 1:36 PM

## 2016-05-04 NOTE — Progress Notes (Signed)
Patient confused, pt unable to locate room. Frequent redirection needed. Medication and group compliant. Irritable at times. Visible in milieu. Denies SI, HI, AVH.  Encouragement and support offered. Safety checks maintained. Medications given as prescribed. Pt remains safe on unit.

## 2016-05-04 NOTE — Progress Notes (Signed)
Patient agitated, packing bags wanting to leave. Pt slamming doors, attempting to pull phones off the wall, tore down shower curtain in bathroom. Pt attempted to call 911. Pt not easily redirected.

## 2016-05-04 NOTE — [facility] Suicide Risk Assessment (Signed)
[facility] INPATIENT:  Family/Significant Other Suicide Prevention Education  Suicide Prevention Education:  Contact Attempts: Husband, Shaquasia Caponigro ph#: [phone] has been identified by the patient as the family member/significant other with whom the patient will be residing, and identified as the person(s) who will aid the patient in the event of a mental health crisis.  With written consent from the patient, two attempts were made to provide suicide prevention education, prior to and/or following the patient's discharge.  We were unsuccessful in providing suicide prevention education.  A suicide education pamphlet was given to the patient to share with family/significant other.  Date and time of second attempt: 05/04/2016 @ 2:01PM  Lynden Oxford, MSW, LCSW-A 05/04/2016, 2:02 PM

## 2016-05-04 NOTE — Tx Team (Signed)
Interdisciplinary Treatment and Diagnostic Plan Update  05/04/2016 Time of Session: 10:30 AM Rachel [name] MRN: [id number]  Principal Diagnosis: Bipolar 2 disorder, major depressive episode (HCC)  Secondary Diagnoses: Principal Problem:   Bipolar 2 disorder, major depressive episode (HCC) Active Problems:   Tobacco use disorder   Opioid use disorder, mild, abuse   HTN (hypertension)   Current Medications:  Current Facility-Administered Medications  Medication Dose Route Frequency Provider Last Rate Last Dose  . acetaminophen (TYLENOL) tablet 650 mg  650 mg Oral Q6H PRN Jolanta B Pucilowska, MD      . alum & mag hydroxide-simeth (MAALOX/MYLANTA) 200-200-20 MG/5ML suspension 30 mL  30 mL Oral Q4H PRN Jolanta B Pucilowska, MD      . carbamazepine (TEGRETOL) tablet 200 mg  200 mg Oral BID AC & HS Jolanta B Pucilowska, MD   200 mg at 05/04/16 0800  . hydrOXYzine (ATARAX/VISTARIL) tablet 50 mg  50 mg Oral TID PRN Shari Prows, MD   50 mg at 05/03/16 2120  . magnesium hydroxide (MILK OF MAGNESIA) suspension 30 mL  30 mL Oral Daily PRN Jolanta B Pucilowska, MD      . nicotine (NICODERM CQ - dosed in mg/24 hours) patch 21 mg  21 mg Transdermal Daily Jolanta B Pucilowska, MD   21 mg at 05/04/16 0800  . OLANZapine (ZYPREXA) tablet 10 mg  10 mg Oral QHS Shari Prows, MD   10 mg at 05/03/16 2121  . traZODone (DESYREL) tablet 100 mg  100 mg Oral QHS Shari Prows, MD   100 mg at 05/03/16 2121   PTA Medications: No prescriptions prior to admission.    Patient Stressors: Marital or family conflict Medication change or noncompliance Substance abuse  Patient Strengths: Active sense of humor Communication skills Supportive family/friends  Treatment Modalities: Medication Management, Group therapy, Case management,  1 to 1 session with clinician, Psychoeducation, Recreational therapy.   Physician Treatment Plan for Primary Diagnosis: Bipolar 2 disorder, major depressive  episode (HCC) Long Term Goal(s): Improvement in symptoms so as ready for discharge Improvement in symptoms so as ready for discharge   Short Term Goals: Ability to identify changes in lifestyle to reduce recurrence of condition will improve Ability to verbalize feelings will improve Ability to disclose and discuss suicidal ideas Ability to demonstrate self-control will improve Ability to identify and develop effective coping behaviors will improve Ability to maintain clinical measurements within normal limits will improve Compliance with prescribed medications will improve Ability to identify triggers associated with substance abuse/mental health issues will improve Ability to identify changes in lifestyle to reduce recurrence of condition will improve Ability to demonstrate self-control will improve Ability to identify triggers associated with substance abuse/mental health issues will improve  Medication Management: Evaluate patient's response, side effects, and tolerance of medication regimen.  Therapeutic Interventions: 1 to 1 sessions, Unit Group sessions and Medication administration.  Evaluation of Outcomes: Progressing  Physician Treatment Plan for Secondary Diagnosis: Principal Problem:   Bipolar 2 disorder, major depressive episode (HCC) Active Problems:   Tobacco use disorder   Opioid use disorder, mild, abuse   HTN (hypertension)  Long Term Goal(s): Improvement in symptoms so as ready for discharge Improvement in symptoms so as ready for discharge   Short Term Goals: Ability to identify changes in lifestyle to reduce recurrence of condition will improve Ability to verbalize feelings will improve Ability to disclose and discuss suicidal ideas Ability to demonstrate self-control will improve Ability to identify and develop effective coping  behaviors will improve Ability to maintain clinical measurements within normal limits will improve Compliance with prescribed  medications will improve Ability to identify triggers associated with substance abuse/mental health issues will improve Ability to identify changes in lifestyle to reduce recurrence of condition will improve Ability to demonstrate self-control will improve Ability to identify triggers associated with substance abuse/mental health issues will improve     Medication Management: Evaluate patient's response, side effects, and tolerance of medication regimen.  Therapeutic Interventions: 1 to 1 sessions, Unit Group sessions and Medication administration.  Evaluation of Outcomes: Progressing   RN Treatment Plan for Primary Diagnosis: Bipolar 2 disorder, major depressive episode (HCC) Long Term Goal(s): Knowledge of disease and therapeutic regimen to maintain health will improve  Short Term Goals: Ability to demonstrate self-control, Ability to disclose and discuss suicidal ideas and Compliance with prescribed medications will improve  Medication Management: RN will administer medications as ordered by provider, will assess and evaluate patient's response and provide education to patient for prescribed medication. RN will report any adverse and/or side effects to prescribing provider.  Therapeutic Interventions: 1 on 1 counseling sessions, Psychoeducation, Medication administration, Evaluate responses to treatment, Monitor vital signs and CBGs as ordered, Perform/monitor CIWA, COWS, AIMS and Fall Risk screenings as ordered, Perform wound care treatments as ordered.  Evaluation of Outcomes: Progressing   LCSW Treatment Plan for Primary Diagnosis: Bipolar 2 disorder, major depressive episode (HCC) Long Term Goal(s): Safe transition to appropriate next level of care at discharge, Engage patient in therapeutic group addressing interpersonal concerns.  Short Term Goals: Engage patient in aftercare planning with referrals and resources, Increase social support and Identify triggers associated with  mental health/substance abuse issues  Therapeutic Interventions: Assess for all discharge needs, 1 to 1 time with Social worker, Explore available resources and support systems, Assess for adequacy in community support network, Educate family and significant other(s) on suicide prevention, Complete Psychosocial Assessment, Interpersonal group therapy.  Evaluation of Outcomes: Progressing    Recreational Therapy Treatment Plan for Primary Diagnosis: Bipolar 2 disorder, major depressive episode (HCC) Long Term Goal(s): Patient will participate in recreation therapy treatment in at least 2 group sessions without prompting from LRT  Short Term Goals: Increase self-esteem, Increase stress management skills  Treatment Modalities: Group Therapy and Individual Treatment Sessions  Therapeutic Interventions: Psychoeducation  Evaluation of Outcomes: Progressing   Progress in Treatment: Attending groups: Yes. Participating in groups: Yes. Taking medication as prescribed: Yes. Toleration medication: Yes. Family/Significant other contact made: Yes, individual(s) contacted:  CSW contacted husband - left voicemail. Patient understands diagnosis: Yes. Discussing patient identified problems/goals with staff: Yes. Medical problems stabilized or resolved: Yes. Denies suicidal/homicidal ideation: Yes. Issues/concerns per patient self-inventory: No.  New problem(s) identified: No, Describe:  None identified   New Short Term/Long Term Goal(s): Pt stated her goal is "to discharge home with family"  Discharge Plan or Barriers: Pt will discharge home with family and follow-up with outpatient providers.  Reason for Continuation of Hospitalization: Delusions  Hallucinations Medication stabilization  Estimated Length of Stay: 3 days   Attendees: Patient: Rachel [name] 05/04/2016 10:13 AM  Physician: Dr. Kristine Linea, MD  05/04/2016 10:13 AM  Nursing: Gertie Exon, BSN, RN  05/04/2016 10:13 AM  RN  Care Manager: 05/04/2016 10:13 AM  Social Worker: Hampton Abbot, MSW, LCSW_A 05/04/2016 10:13 AM  Recreational Therapist: Princella Ion, LRT/CTRS  05/04/2016 10:13 AM  Other:  05/04/2016 10:13 AM  Other:  05/04/2016 10:13 AM  Other: 05/04/2016 10:13 AM    Scribe for  Treatment Team: Lynden Oxford, Theresia Majors 05/04/2016 10:13 AM

## 2016-05-04 NOTE — [facility] Group Notes (Addendum)
[facility] LCSW Group Therapy   05/04/2016 1:00 pm Type of Therapy: Group Therapy   Participation Level: Active   Participation Quality: Attentive, Sharing and Supportive   Affect: Appropriate   Cognitive: Alert and Oriented   Insight: Developing/Improving and Engaged   Engagement in Therapy: Developing/Improving and Engaged   Modes of Intervention: Clarification, Confrontation, Discussion, Education, Exploration,  Limit-setting, Orientation, Problem-solving, Rapport Building, Dance movement psychotherapist, Socialization and Support   Summary of Progress/Problems: Pt identified obstacles faced currently and processed barriers involved in overcoming these obstacles. Pt identified steps necessary for overcoming these obstacles and explored motivation (internal and external) for facing these difficulties head on. Pt further identified one area of concern in their lives and chose a goal to focus on for today. Pt defined obstacle as "something to get over." She identified family as an obstacle. Pt identified healthy coping mechanisms to implement in her life to engage appropriately in recovery.   Hampton Abbot, MSW, LCSWA 05/04/2016, 1:34 PM

## 2016-05-04 NOTE — Progress Notes (Signed)
Patient ID: Rachel [name], female   DOB: 04/19/1955, 61 y.o.   MRN: [id number] Irrate, hostile, very irritable, agitated, trashed room, threw mattress outside the hallway, yelling out loud, wanted a cigarette, "I want a cigarette now ..." Able to diffuse and de-escalated by active listening to her; patient was allowed to vent and reasoned with; patient re-organized and place everything back in her room and made her bed. Early bedtime medications given including the Risperidone , patient understands that this is a non smoking facility and only nicotine patch will be given for graving.

## 2016-05-05 LAB — HEMOGLOBIN A1C
HEMOGLOBIN A1C: 6 % — AB (ref 4.8–5.6)
MEAN PLASMA GLUCOSE: 126 mg/dL

## 2016-05-05 MED ORDER — TRAZODONE HCL 100 MG PO TABS
200.0000 mg | ORAL_TABLET | Freq: Every day | ORAL | Status: DC
  Administered 2016-05-05: 200 mg via ORAL
  Filled 2016-05-05: qty 2

## 2016-05-05 MED ORDER — CHLORPROMAZINE HCL 100 MG PO TABS
100.0000 mg | ORAL_TABLET | Freq: Four times a day (QID) | ORAL | Status: DC | PRN
  Filled 2016-05-05: qty 1

## 2016-05-05 MED ORDER — OLANZAPINE 10 MG PO TABS
10.0000 mg | ORAL_TABLET | Freq: Two times a day (BID) | ORAL | Status: DC
  Administered 2016-05-05 – 2016-05-06 (×2): 10 mg via ORAL
  Filled 2016-05-05 (×2): qty 1

## 2016-05-05 MED ORDER — ASENAPINE MALEATE 5 MG SL SUBL
10.0000 mg | SUBLINGUAL_TABLET | Freq: Two times a day (BID) | SUBLINGUAL | Status: DC | PRN
  Administered 2016-05-05 (×2): 10 mg via SUBLINGUAL
  Filled 2016-05-05 (×2): qty 2

## 2016-05-05 MED ORDER — CHLORPROMAZINE HCL 25 MG/ML IJ SOLN
100.0000 mg | Freq: Four times a day (QID) | INTRAMUSCULAR | Status: DC | PRN
  Filled 2016-05-05: qty 4

## 2016-05-05 MED ORDER — CHLORPROMAZINE HCL 100 MG PO TABS: 100.0000 mg | ORAL_TABLET | Freq: Four times a day (QID) | ORAL | Status: DC | PRN

## 2016-05-05 NOTE — Plan of Care (Signed)
Problem: Activity: Goal: Sleeping patterns will improve Outcome: Progressing Patient slept for Estimated Hours of 4.15; every 15 minutes safety round maintained, no injury or falls during this shift.    

## 2016-05-05 NOTE — [facility] Group Notes (Signed)
[facility] LCSW Group Therapy Note  Date/Time: 05/05/16, 1500  Type of Therapy/Topic:  Group Therapy:  Feelings about Diagnosis  Participation Level:  Minimal   Mood: quiet   Description of Group:    This group will allow patients to explore their thoughts and feelings about diagnoses they have received. Patients will be guided to explore their level of understanding and acceptance of these diagnoses. Facilitator will encourage patients to process their thoughts and feelings about the reactions of others to their diagnosis, and will guide patients in identifying ways to discuss their diagnosis with significant others in their lives. This group will be process-oriented, with patients participating in exploration of their own experiences as well as giving and receiving support and challenge from other group members.   Therapeutic Goals: 1. Patient will demonstrate understanding of diagnosis as evidence by identifying two or more symptoms of the disorder:  2. Patient will be able to express two feelings regarding the diagnosis 3. Patient will demonstrate ability to communicate their needs through discussion and/or role plays  Summary of Patient Progress: Pt was able to share a few symptoms of mania as part of a general discussion about different diagnoses.  Pt did not share her own diagnosis and was quiet for much of the group.        Therapeutic Modalities:   Cognitive Behavioral Therapy Brief Therapy Feelings Identification   Daleen Squibb, LCSW

## 2016-05-05 NOTE — Progress Notes (Signed)
Patient ID: Rachel [name], female   DOB: [birth year], 60 y.o.   MRN: [id number] Awoke, wandering, confused, "Where is Minerva Areola?... My husband, where is Minerva Areola?" Patient was re-oriented and redirected back to bed.
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
## 2016-05-05 NOTE — Progress Notes (Signed)
[facility] MD Progress Note  05/05/2016 8:44 AM Rachel [name]  MRN:  [id number] Subjective:   61 year old female with history of bipolar 2 disorder and substance abuse admitted for disorganized behavior.  05/04/16 patient complains of feeling very "edgy". He says that she is craving a cigarette and that they nicotine patch is not really helping her. She is unable to tell me why she is in the hospital. She denies today having problems with suicidality, homicidality or auditory or visual hallucinations. The patient says that in the past she used to abuse opiates pills and heroine. She says she cannot remember when the last use was doesn't think she used recently. She does not think she is in withdrawals. Patient denies having any side effects from medications or any major physical complaints. Per nursing staff she continues to be confused and gets lost frequently around the unit.  05/05/2016. Rachel [name] is very pleasant with me this morning. She denies any problems. She recognizes me from previous admission the was many years ago. Per staff, she is forgetful and easily agitated.  Per nursing: Irrate, hostile, very irritable, agitated, trashed room, threw mattress outside the hallway, yelling out loud, wanted a cigarette, "I want a cigarette now ..." Able to diffuse and de-escalated by active listening to her; patient was allowed to vent and reasoned with; patient re-organized and place everything back in her room and made her bed. Early bedtime medications given including the Risperidone , patient understands that this is a non smoking facility and only nicotine patch will be given for graving.    Principal Problem: Bipolar 2 disorder, major depressive episode (HCC) Diagnosis:   Patient Active Problem List   Diagnosis Date Noted  . Cerebrovascular disease [I67.9] 05/04/2016  . Acute delirium [R41.0] 05/04/2016  . Opioid use disorder, severe, dependence (HCC) [F11.20] 05/04/2016  . Bipolar 2 disorder, major  depressive episode (HCC) [F31.81] 05/02/2016  . Tobacco use disorder [F17.200] 05/02/2016  . Opioid use disorder, mild, abuse [F11.10] 05/02/2016  . HTN (hypertension) [I10] 05/02/2016   Total Time spent with patient: 30 minutes  Past Psychiatric History: She was hospitalized twice it is at [facility] for depressive episodes. She remembers attempting suicide when she was young. She follows up with [facility] and Dr. Georjean Mode but has been off medications for over a month. The family suspects drug abuse. She did well in the past on a combination of Risperdal and Tegretol.  Past Medical History:  Past Medical History:  Diagnosis Date  . Hepatitis C   . Hypertension    History reviewed. No pertinent surgical history.  Family History: History reviewed. No pertinent family history.  Family Psychiatric  History: Her father and her cousin committed suicide.  Social History: She lives with her husband. Her daughter also lives in the area. They both took commitment papers on the patient. History  Alcohol Use  . Yes    Comment: rarely     History  Drug Use No    Social History   Social History  . Marital status: Married    Spouse name: N/A  . Number of children: N/A  . Years of education: N/A   Social History Main Topics  . Smoking status: Current Every Day Smoker    Packs/day: 1.00    Types: Cigarettes  . Smokeless tobacco: Never Used  . Alcohol use Yes     Comment: rarely  . Drug use: No  . Sexual activity: Not Asked   Other Topics Concern  .  None   Social History Narrative  . None    Current Medications: Current Facility-Administered Medications  Medication Dose Route Frequency Provider Last Rate Last Dose  . acetaminophen (TYLENOL) tablet 650 mg  650 mg Oral Q6H PRN Makaiya Geerdes B Chandler Swiderski, MD      . alum & mag hydroxide-simeth (MAALOX/MYLANTA) 200-200-20 MG/5ML suspension 30 mL  30 mL Oral Q4H PRN Keslee Harrington B Berenize Gatlin, MD      . carbamazepine (TEGRETOL)  tablet 200 mg  200 mg Oral BID AC & HS Ossie Beltran B Declyn Delsol, MD   200 mg at 05/05/16 0830  . haloperidol lactate (HALDOL) injection 2 mg  2 mg Intramuscular Once Audery Amel, MD      . hydrochlorothiazide (MICROZIDE) capsule 12.5 mg  12.5 mg Oral Daily Jimmy Footman, MD   12.5 mg at 05/05/16 0830  . magnesium hydroxide (MILK OF MAGNESIA) suspension 30 mL  30 mL Oral Daily PRN Panayiotis Rainville B Amalya Salmons, MD      . nicotine (NICODERM CQ - dosed in mg/24 hours) patch 21 mg  21 mg Transdermal Daily Williamson Cavanah B Sanuel Ladnier, MD   21 mg at 05/05/16 0830  . OLANZapine (ZYPREXA) tablet 10 mg  10 mg Oral QHS Shari Prows, MD   10 mg at 05/04/16 1929  . traZODone (DESYREL) tablet 100 mg  100 mg Oral QHS Shari Prows, MD   100 mg at 05/04/16 1929    Lab Results:  Results for orders placed or performed during the hospital encounter of 05/03/16 (from the past 48 hour(s))  Hemoglobin A1c     Status: Abnormal   Collection Time: 05/04/16  7:02 AM  Result Value Ref Range   Hgb A1c MFr Bld 6.0 (H) 4.8 - 5.6 %    Comment: (NOTE)         Pre-diabetes: 5.7 - 6.4         Diabetes: >6.4         Glycemic control for adults with diabetes: <7.0    Mean Plasma Glucose 126 mg/dL    Comment: (NOTE) Performed At: [facility] [address] [place], [place] [id number] Mila Homer MD VH:[id number]   Lipid panel     Status: Abnormal   Collection Time: 05/04/16  7:02 AM  Result Value Ref Range   Cholesterol 210 (H) 0 - 200 mg/dL   Triglycerides 89 <413 mg/dL   HDL 53 >24 mg/dL   Total CHOL/HDL Ratio 4.0 RATIO   VLDL 18 0 - 40 mg/dL   LDL Cholesterol 401 (H) 0 - 99 mg/dL    Comment:        Total Cholesterol/HDL:CHD Risk Coronary Heart Disease Risk Table                     Men   Women  1/2 Average Risk   3.4   3.3  Average Risk       5.0   4.4  2 X Average Risk   9.6   7.1  3 X Average Risk  23.4   11.0        Use the calculated Patient Ratio above and the CHD Risk  Table to determine the patient's CHD Risk.        ATP III CLASSIFICATION (LDL):  <100     mg/dL   Optimal  027-253  mg/dL   Near or Above  Optimal  130-159  mg/dL   Borderline  161-096  mg/dL   High  >045     mg/dL   Very High   TSH     Status: None   Collection Time: 05/04/16  7:02 AM  Result Value Ref Range   TSH 1.838 0.350 - 4.500 uIU/mL    Comment: Performed by a 3rd Generation assay with a functional sensitivity of <=0.01 uIU/mL.    Blood Alcohol level:  Lab Results  Component Value Date   ETH <5 05/02/2016   ETH <5 04/24/2016    Metabolic Disorder Labs: Lab Results  Component Value Date   HGBA1C 6.0 (H) 05/04/2016   MPG 126 05/04/2016   No results found for: PROLACTIN Lab Results  Component Value Date   CHOL 210 (H) 05/04/2016   TRIG 89 05/04/2016   HDL 53 05/04/2016   CHOLHDL 4.0 05/04/2016   VLDL 18 05/04/2016   LDLCALC 139 (H) 05/04/2016    Physical Findings: AIMS:  , ,  ,  ,    CIWA:    COWS:     Musculoskeletal: Strength & Muscle Tone: within normal limits Gait & Station: normal Patient leans: N/A  Psychiatric Specialty Exam: Physical Exam  Nursing note and vitals reviewed. Constitutional: She is oriented to person, place, and time.  Neurological: She is oriented to person, place, and time.  Psychiatric: Her speech is normal. Her affect is labile and inappropriate. She is hyperactive. Thought content is paranoid and delusional. Cognition and memory are impaired. She expresses impulsivity.    Review of Systems  Musculoskeletal: Positive for joint pain.  Psychiatric/Behavioral: Positive for substance abuse. The patient has insomnia.   All other systems reviewed and are negative.   Blood pressure (!) 147/92, pulse 98, temperature 98.9 F (37.2 C), resp. rate 18, height  (1.702 m), weight 49.9 kg (110 lb), SpO2 100 %.Body mass index is 17.23 kg/m.  General Appearance: Fairly Groomed  Eye Contact:  Good  Speech:  Clear  and Coherent  Volume:  Normal  Mood:  Irritable  Affect:  Constricted  Thought Process:  Linear and Descriptions of Associations: Intact  Orientation:  Full (Time, Place, and Person)  Thought Content:  Hallucinations: None  Suicidal Thoughts:  No  Homicidal Thoughts:  No  Memory:  Immediate;   Poor Recent;   Poor Remote;   Fair  Judgement:  Poor  Insight:  Shallow  Psychomotor Activity:  Normal  Concentration:  Concentration: Fair and Attention Span: Fair  Recall:  [employer] of Knowledge:  Fair  Language:  Good  Akathisia:  No  Handed:    AIMS (if indicated):     Assets:  Physical Health Social Support  ADL's:  Intact  Cognition:  Impaired,  Mild  Sleep:  Number of Hours: 4.15     Treatment Plan Summary:  Rachel [name] is a 61 year old female with history of bipolar disorder and substance admitted for worsening of her symptoms in the context of treatment noncompliance.  1. Delirium. Upon arrival to the emergency department the patient was combative and disoriented.  2. Mood. We restarted Tegretol and increase Zyprexa for mood stabilization.  3. Insomnia. Sleep continues to be a problem We will increase trazodone to 200 mg.  4. Smoking. Nicotine patch is available.   5. Opioid abuse. She denies symptoms of withdrawals  6. Cerebrovascular disease. 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  7. HTN. Will start HCTZ 12.5 mg  8. Metabolic syndrome monitoring. Lipid panel (elevated total cholesterol  at 210, elevated LDL at 139), TSH (1.8 wnl), hemoglobin A1c is elevated at 6.0.   9. EKG. Normal sinus rhythm, QTC 449.   10. Head CT:  1. No definite CT evidence for acute intracranial abnormality. 2. Old appearing lacunar infarcts in the left thalamus and basalganglia but new compared with comparison CT from 2009.   11. Disposition. She will be discharged home with her husband. She will follow up with [facility].   Kristine Linea, MD 05/05/2016, 8:44 AM

## 2016-05-05 NOTE — Plan of Care (Signed)
Problem: [facility] Participation in Recreation Therapeutic Interventions Goal: STG-Patient will demonstrate improved self esteem by identif STG: Self-Esteem - Within 4 treatment sessions, patient will verbalize at least 5 positive affirmation statements in each of 2 treatment sessions to increase self-esteem.  Outcome: Progressing Treatment Session 1; Completed 1 out of 2: At approximately 10:55 am, LRT met with patient in patient room. Patient verbalized 5 positive affirmation statements. Patient reported it felt "fine". LRT encouraged patient to continue practicing the stress management techniques.  Leonette Monarch, LRT/CTRS 04.03.18 1:24 pm Goal: STG-Other Recreation Therapy Goal (Specify) STG: Stress Management - Within 4 treatment sessions, patient will verbalize understanding of the stress management techniques in each of 2 treatment sessions to increase stress management skills.  Outcome: Progressing Treatment Session 1; Completed 1 out of 2: At approximately 10:55 am, LRT met with patient in patient room. LRT educated and provided patient with handouts on stress management techniques. Patient verbalized understanding. LRT encouraged patient to read over and practice the stress management techniques.  Leonette Monarch, LRT/CTRS 04.03.18 1:25 pm

## 2016-05-05 NOTE — Plan of Care (Signed)
Problem: Education: Goal: Emotional status will improve Outcome: Not Progressing Continues to be tearful and confused

## 2016-05-06 DIAGNOSIS — E538 Deficiency of other specified B group vitamins: Secondary | ICD-10-CM | POA: Diagnosis present

## 2016-05-06 LAB — VITAMIN B12: Vitamin B-12: 303 pg/mL (ref 180–914)

## 2016-05-06 LAB — FOLATE: Folate: 17.2 ng/mL (ref >5.9)

## 2016-05-06 MED ORDER — CYANOCOBALAMIN 1000 MCG PO TABS: 1000.0000 ug | ORAL_TABLET | Freq: Every day | ORAL | 1 refills | Status: DC

## 2016-05-06 MED ORDER — OLANZAPINE 10 MG PO TABS: 20.0000 mg | ORAL_TABLET | Freq: Every day | ORAL | Status: DC

## 2016-05-06 MED ORDER — OLANZAPINE 20 MG PO TABS: 20.0000 mg | ORAL_TABLET | Freq: Every day | ORAL | 1 refills | Status: DC

## 2016-05-06 MED ORDER — CARBAMAZEPINE 200 MG PO TABS: 200.0000 mg | ORAL_TABLET | Freq: Two times a day (BID) | ORAL | 1 refills | Status: DC

## 2016-05-06 MED ORDER — HYDROCHLOROTHIAZIDE 25 MG PO TABS: 25.0000 mg | ORAL_TABLET | Freq: Every day | ORAL | 1 refills | Status: DC

## 2016-05-06 MED ORDER — VITAMIN B-12 1000 MCG PO TABS
1000.0000 ug | ORAL_TABLET | Freq: Every day | ORAL | Status: DC
Start: 2016-05-06 — End: 2016-05-06
  Administered 2016-05-06: 1000 ug via ORAL
  Filled 2016-05-06: qty 1

## 2016-05-06 MED ORDER — HYDROCHLOROTHIAZIDE 25 MG PO TABS: 25.0000 mg | ORAL_TABLET | Freq: Every day | ORAL | Status: DC

## 2016-05-06 NOTE — Consult Note (Signed)
  Psychological Assessment   Name: Rachel [name] Age: 61 Date of Evaluation: 05-06-16 Test(s) Administered: Hessie Diener Gestalt  Trail Making Test Parts A & B  Reason for Referral: her physician, Kristine Linea, MD., referred [name] for a psychological assessment.  She was admitted to Behavioral Medicine for the treatment of aggressive behavior. She has a history of bipolar disorder and was positive for opioids upon admission. She appears to have experienced audio and visual hallucinations prior to admission as well as periods of confusion and disorientation.   Please see the history and physical for additional background information. An assessment of cognitive functioning was requested.  Validity: [name] was pleasant and not uncooperative with the testing process. She stated she did not remember being asked to participate in the testing process. She attempted all tasks requested of her. She appeared to try her best.  She was impulsive at times, wanting to start the task before the instructions had been completed. She was redirectable. The present evaluation is considered a valid indication of current functioning.  Trail Making Test - [name] completed Part A in 47 seconds. The expected time to completion is 27-39 seconds. [name]' performance suggests possible mild impairment.  [name]' completed Part B in 90 seconds. The expected time to completion is 66-85 seconds. [name]' performance suggests mild impairment.  [name] - [name] completed the task in less than 15 minutes. She obtained 1 errors, closure difficulty.   Diagnostic Impression: [name]' screening suggests impulsivity. She admits to having difficulty with her memory. Her testing does not currently reflect cognitive deficit.

## 2016-05-06 NOTE — Discharge Summary (Signed)
Physician Discharge Summary Note  Patient:  Rachel [name] is an 61 y.o., female MRN:  [id number] DOB:  15-Jan-1956 Patient phone:  [phone] (home)  Patient address:   [address] [place] [address],  Total Time spent with patient: 30 minutes  Date of Admission:  05/03/2016 Date of Discharge: 05/06/2016  Reason for Admission:  Psychotic break.  Identifying data. Rachel [name] is a 61 year old female with history of bipolar 2 disorder and substance abuse.  Chief complaint. "My husband put me here."  History of present illness. Information was obtained from the patient and the chart. The patient has a history of bipolar 2 disorder but has been off her medications for a month now. She became increasingly confused after she [place] her [place] She reportedly was hallucinating, including visual hallucinations, and paranoid, moody and irritable likely abusing drugs. She was positive for opioids on admission. Her husband petition her on 2/23 for confusion and bizarre behavior. She was discharged to home by tele-psychiatrist. She returned 2 days later on petition from her daughter for agitation and was eventually admitted to [facility] for safety and stabilization. The patient denies any symptoms of depression, anxiety, or psychosis. She seems clueless about the reasons for her admission. She denies alcohol or illicit substance use.  Past psychiatric history. She was hospitalized twice it is at [facility] for depressive episodes. She remembers attempting suicide when she was young. She follows up with [facility] and Dr. Georjean Mode but has been off medications for over a month. The family suspects drug abuse. She did well in the past on a combination of Risperdal and Tegretol.  Family psychiatric history. Her father and her cousin committed suicide.  Social history. She lives with her husband. Her daughter also lives in the area. They  both took commitment papers on the patient.  Principal Problem: Bipolar 2 disorder, major depressive episode [facility]) Discharge Diagnoses: Patient Active Problem List   Diagnosis Date Noted  . Vitamin B12 deficiency [E53.8] 05/06/2016  . Cerebrovascular disease [I67.9] 05/04/2016  . Bipolar 2 disorder, major depressive episode (HCC) [F31.81] 05/02/2016  . Tobacco use disorder [F17.200] 05/02/2016  . Opioid use disorder, mild, abuse [F11.10] 05/02/2016  . HTN (hypertension) [I10] 05/02/2016   Past Medical History:  Past Medical History:  Diagnosis Date  . Hepatitis C   . Hypertension    History reviewed. No pertinent surgical history. Family History: History reviewed. No pertinent family history.  Social History:  History  Alcohol Use  . Yes    Comment: rarely     History  Drug Use No    Social History   Social History  . Marital status: Married    Spouse name: N/A  . Number of children: N/A  . Years of education: N/A   Social History Main Topics  . Smoking status: Current Every Day Smoker    Packs/day: 1.00    Types: Cigarettes  . Smokeless tobacco: Never Used  . Alcohol use Yes     Comment: rarely  . Drug use: No  . Sexual activity: Not Asked   Other Topics Concern  . None   Social History Narrative  . None    Hospital Course:    Rachel [name] is a 61 year old female with history of bipolar disorder and substance admitted for worsening of her symptoms in the context of treatment noncompliance.  1. Agitation. Resolved. The patient was upset about cigarettes.  2. Mood. We restarted Tegretol and added Zyprexa for mood stabilization.  3. Insomnia. Resolved with Zyprexa.    4. Smoking. Nicotine patch is available.   5. Opioid abuse. She denies symptoms of withdrawals and declines substance abuse treatment.  6. Cerebrovascular disease. 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  7. HTN. We started HCTZ.   8. Metabolic syndrome monitoring. Lipid panel (elevated  total cholesterol at 210, elevated LDL at 139), TSH (1.8 wnl), hemoglobin A1c is elevated at 6.0.   9. EKG. Normal sinus rhythm, QTC 449.   10. Cognitive decline. RPR, HIV, B12 and folic acid levels are pending.  11. Head CT: 1. No definite CT evidence for acute intracranial abnormality. 2. Old appearing lacunar infarcts in the left thalamus and basalganglia but new compared with comparison CT from 2009.  12. Psychological Assessment  Name: Rachel [name] Age: 53 Date of Evaluation: 05-06-16 Test(s) Administered: Rachel [name]             Trail Making Test Parts A & B  Reason for Referral: her physician, Rachel Linea, MD., referred Rachel [name] for a psychological assessment.  She was admitted to Behavioral Medicine for the treatment of aggressive behavior. She has a history of bipolar disorder and was positive for opioids upon admission. She appears to have experienced audio and visual hallucinations prior to admission as well as periods of confusion and disorientation.   Please see the history and physical for additional background information. An assessment of cognitive functioning was requested.  Validity: Rachel [name] was pleasant and not uncooperative with the testing process. She stated she did not remember being asked to participate in the testing process. She attempted all tasks requested of her. She appeared to try her best.  She was impulsive at times, wanting to start the task before the instructions had been completed. She was redirectable. The present evaluation is considered a valid indication of current functioning.  Trail Making Test - Rachel [name] completed Part A in 47 seconds. The expected time to completion is 27-39 seconds. Rachel [name]' performance suggests possible mild impairment.  Rachel [name]' completed Part B in 90 seconds. The expected time to completion is 66-85 seconds. Rachel [name]' performance suggests mild impairment.  Rachel [name] - Rachel [name] completed  the task in less than 15 minutes. She obtained 1 errors, closure difficulty.   Diagnostic Impression: Rachel [name]' screening suggests impulsivity. She admits to having difficulty with her memory. Her testing does not currently reflect cognitive deficit.  13. Disposition. She was discharged home with her husband. She will follow up with [facility].  Physical Findings: AIMS:  , ,  ,  ,    CIWA:    COWS:     Musculoskeletal: Strength & Muscle Tone: within normal limits Gait & Station: normal Patient leans: N/A  Psychiatric Specialty Exam: Physical Exam  Nursing note and vitals reviewed. Psychiatric: She has a normal mood and affect. Her speech is normal. Thought content normal. She is hyperactive. Cognition and memory are normal. She expresses impulsivity.    Review of Systems  Psychiatric/Behavioral: Positive for substance abuse.  All other systems reviewed and are negative.   Blood pressure (!) 166/110, pulse 88, temperature 97.9 F (36.6 C), resp. rate 18, height  (1.702 m), weight 49.9 kg (110 lb), SpO2 100 %.Body mass index is 17.23 kg/m.  General Appearance: Casual  Eye Contact:  Good  Speech:  Clear and Coherent  Volume:  Normal  Mood:  Angry  Affect:  Congruent  Thought Process:  Goal Directed and Descriptions of Associations: Intact  Orientation:  Full (Time, Place, and Person)  Thought Content:  WDL  Suicidal Thoughts:  No  Homicidal Thoughts:  No  Memory:  Immediate;   Fair Recent;   Fair Remote;   Fair  Judgement:  Impaired  Insight:  Shallow  Psychomotor Activity:  Normal  Concentration:  Concentration: Fair and Attention Span: Fair  Recall:  [employer] of Knowledge:  Fair  Language:  Fair  Akathisia:  No  Handed:  Right  AIMS (if indicated):     Assets:  Communication Skills Desire for Improvement Financial Resources/Insurance Housing Physical Health Resilience Social Support  ADL's:  Intact  Cognition:  WNL  Sleep:  Number of Hours: 7      Have you used any form of tobacco in the last 30 days? (Cigarettes, Smokeless Tobacco, Cigars, and/or Pipes): Yes  Has this patient used any form of tobacco in the last 30 days? (Cigarettes, Smokeless Tobacco, Cigars, and/or Pipes) Yes, Yes, A prescription for an FDA-approved tobacco cessation medication was offered at discharge and the patient refused  Blood Alcohol level:  Lab Results  Component Value Date   [facility] <5 05/02/2016   ETH <5 04/24/2016    Metabolic Disorder Labs:  Lab Results  Component Value Date   HGBA1C 6.0 (H) 05/04/2016   MPG 126 05/04/2016   No results found for: PROLACTIN Lab Results  Component Value Date   CHOL 210 (H) 05/04/2016   TRIG 89 05/04/2016   HDL 53 05/04/2016   CHOLHDL 4.0 05/04/2016   VLDL 18 05/04/2016   LDLCALC 139 (H) 05/04/2016    See Psychiatric Specialty Exam and Suicide Risk Assessment completed by Attending Physician prior to discharge.  Discharge destination:  Home  Is patient on multiple antipsychotic therapies at discharge:  No   Has Patient had three or more failed trials of antipsychotic monotherapy by history:  No  Recommended Plan for Multiple Antipsychotic Therapies: NA  Discharge Instructions    Diet - low sodium heart healthy    Complete by:  As directed    Increase activity slowly    Complete by:  As directed      Allergies as of 05/06/2016      Reactions   Tetracyclines & Related Rash      Medication List    TAKE these medications     Indication  carbamazepine 200 MG tablet Commonly known as:  TEGRETOL Take 1 tablet (200 mg total) by mouth 2 (two) times daily at 8 am and 10 pm.  Indication:  Manic-Depression   cyanocobalamin 1000 MCG tablet Take 1 tablet (1,000 mcg total) by mouth daily.  Indication:  Inadequate Vitamin B12   hydrochlorothiazide 25 MG tablet Commonly known as:  HYDRODIURIL Take 1 tablet (25 mg total) by mouth daily. Start taking on:  05/07/2016  Indication:  High Blood Pressure  Disorder   OLANZapine 20 MG tablet Commonly known as:  ZYPREXA Take 1 tablet (20 mg total) by mouth at bedtime.  Indication:  Manic-Depression      Follow-up Information    [facility]. Go on 05/08/2016.   Why:  Please follow-up with [facility] for your medication managment and hospital discharge assessment on April 6th at 12:30 PM. Please bring discharge paperwork to this appointment. If you have questions, contact office directly.  Contact information: [address] [place] [address] [phone]           Follow-up recommendations:  Activity:  As tolerated. Diet:  Low sodium heart healthy. Other:  Keep follow-up appointments.  Comments:    Signed: Kristine Linea, MD 05/06/2016, 3:29 PM

## 2016-05-06 NOTE — Progress Notes (Signed)
Pt stating she will "tear this place apart" if she is unable to smoke a cigarette.  Pt compliant with medications.  Pt offered emotional support and encouragement. 15 minute checks in place. Pt remains safe.

## 2016-05-06 NOTE — Tx Team (Addendum)
Interdisciplinary Treatment and Diagnostic Plan Update  05/06/2016 Time of Session: 10:30 AM Rachel [name] MRN: [id number]  Principal Diagnosis: Bipolar 2 disorder, major depressive episode (HCC)  Secondary Diagnoses: Principal Problem:   Bipolar 2 disorder, major depressive episode (HCC) Active Problems:   Tobacco use disorder   Opioid use disorder, mild, abuse   HTN (hypertension)   Cerebrovascular disease   Acute delirium   Opioid use disorder, severe, dependence (HCC)   Current Medications:  Current Facility-Administered Medications  Medication Dose Route Frequency Provider Last Rate Last Dose  . acetaminophen (TYLENOL) tablet 650 mg  650 mg Oral Q6H PRN Jolanta B Pucilowska, MD      . alum & mag hydroxide-simeth (MAALOX/MYLANTA) 200-200-20 MG/5ML suspension 30 mL  30 mL Oral Q4H PRN Jolanta B Pucilowska, MD      . carbamazepine (TEGRETOL) tablet 200 mg  200 mg Oral BID AC & HS Jolanta B Pucilowska, MD   200 mg at 05/06/16 0809  . chlorproMAZINE (THORAZINE) injection 100 mg  100 mg Intramuscular QID PRN Jolanta B Pucilowska, MD       Or  . chlorproMAZINE (THORAZINE) tablet 100 mg  100 mg Oral QID PRN Shari Prows, MD      . Melene Muller ON 05/07/2016] hydrochlorothiazide (HYDRODIURIL) tablet 25 mg  25 mg Oral Daily Jolanta B Pucilowska, MD      . magnesium hydroxide (MILK OF MAGNESIA) suspension 30 mL  30 mL Oral Daily PRN Jolanta B Pucilowska, MD      . nicotine (NICODERM CQ - dosed in mg/24 hours) patch 21 mg  21 mg Transdermal Daily Jolanta B Pucilowska, MD   21 mg at 05/06/16 0809  . OLANZapine (ZYPREXA) tablet 20 mg  20 mg Oral QHS Jolanta B Pucilowska, MD       PTA Medications: No prescriptions prior to admission.    Patient Stressors: Marital or family conflict Medication change or noncompliance Substance abuse  Patient Strengths: Active sense of humor Communication skills Supportive family/friends  Treatment Modalities: Medication Management, Group therapy,  Case management,  1 to 1 session with clinician, Psychoeducation, Recreational therapy.   Physician Treatment Plan for Primary Diagnosis: Bipolar 2 disorder, major depressive episode (HCC) Long Term Goal(s): Improvement in symptoms so as ready for discharge Improvement in symptoms so as ready for discharge   Short Term Goals: Ability to identify changes in lifestyle to reduce recurrence of condition will improve Ability to verbalize feelings will improve Ability to disclose and discuss suicidal ideas Ability to demonstrate self-control will improve Ability to identify and develop effective coping behaviors will improve Ability to maintain clinical measurements within normal limits will improve Compliance with prescribed medications will improve Ability to identify triggers associated with substance abuse/mental health issues will improve Ability to identify changes in lifestyle to reduce recurrence of condition will improve Ability to demonstrate self-control will improve Ability to identify triggers associated with substance abuse/mental health issues will improve  Medication Management: Evaluate patient's response, side effects, and tolerance of medication regimen.  Therapeutic Interventions: 1 to 1 sessions, Unit Group sessions and Medication administration.  Evaluation of Outcomes: Progressing  Physician Treatment Plan for Secondary Diagnosis: Principal Problem:   Bipolar 2 disorder, major depressive episode (HCC) Active Problems:   Tobacco use disorder   Opioid use disorder, mild, abuse   HTN (hypertension)   Cerebrovascular disease   Acute delirium   Opioid use disorder, severe, dependence (HCC)  Long Term Goal(s): Improvement in symptoms so as ready for discharge Improvement in  symptoms so as ready for discharge   Short Term Goals: Ability to identify changes in lifestyle to reduce recurrence of condition will improve Ability to verbalize feelings will improve Ability  to disclose and discuss suicidal ideas Ability to demonstrate self-control will improve Ability to identify and develop effective coping behaviors will improve Ability to maintain clinical measurements within normal limits will improve Compliance with prescribed medications will improve Ability to identify triggers associated with substance abuse/mental health issues will improve Ability to identify changes in lifestyle to reduce recurrence of condition will improve Ability to demonstrate self-control will improve Ability to identify triggers associated with substance abuse/mental health issues will improve     Medication Management: Evaluate patient's response, side effects, and tolerance of medication regimen.  Therapeutic Interventions: 1 to 1 sessions, Unit Group sessions and Medication administration.  Evaluation of Outcomes: Progressing   RN Treatment Plan for Primary Diagnosis: Bipolar 2 disorder, major depressive episode (HCC) Long Term Goal(s): Knowledge of disease and therapeutic regimen to maintain health will improve  Short Term Goals: Ability to verbalize frustration and anger appropriately will improve and Ability to identify and develop effective coping behaviors will improve  Medication Management: RN will administer medications as ordered by provider, will assess and evaluate patient's response and provide education to patient for prescribed medication. RN will report any adverse and/or side effects to prescribing provider.  Therapeutic Interventions: 1 on 1 counseling sessions, Psychoeducation, Medication administration, Evaluate responses to treatment, Monitor vital signs and CBGs as ordered, Perform/monitor CIWA, COWS, AIMS and Fall Risk screenings as ordered, Perform wound care treatments as ordered.  Evaluation of Outcomes: Progressing   LCSW Treatment Plan for Primary Diagnosis: Bipolar 2 disorder, major depressive episode (HCC) Long Term Goal(s): Safe transition  to appropriate next level of care at discharge, Engage patient in therapeutic group addressing interpersonal concerns.  Short Term Goals: Engage patient in aftercare planning with referrals and resources and Increase social support  Therapeutic Interventions: Assess for all discharge needs, 1 to 1 time with Social worker, Explore available resources and support systems, Assess for adequacy in community support network, Educate family and significant other(s) on suicide prevention, Complete Psychosocial Assessment, Interpersonal group therapy.  Evaluation of Outcomes: Progressing    Recreational Therapy Treatment Plan for Primary Diagnosis: Bipolar 2 disorder, major depressive episode (HCC) Long Term Goal(s): Patient will participate in recreation therapy treatment in at least 2 group sessions without prompting from LRT  Short Term Goals: Increase self-esteem, Increase stress management skills  Treatment Modalities: Group Therapy and Individual Treatment Sessions  Therapeutic Interventions: Psychoeducation  Evaluation of Outcomes: Progressing   Progress in Treatment: Attending groups: Yes. Participating in groups: Yes. Taking medication as prescribed: Yes. Toleration medication: Yes. Family/Significant other contact made: Yes, individual(s) contacted:  CSW contacted husband, Minerva Areola. Patient understands diagnosis: Yes. Discussing patient identified problems/goals with staff: Yes. Medical problems stabilized or resolved: Yes. Denies suicidal/homicidal ideation: Yes. Issues/concerns per patient self-inventory: No.  New problem(s) identified: Yes, Describe:  Cognitive testing per Dr. Ilda Mori Short Term/Long Term Goal(s): Pt stated that her goal is to "go home."  05/06/2016: CSW is working on aftercare plans - will contact husband to get more information.  Discharge Plan or Barriers: CSW still assessing proper aftercare plans.   Reason for Continuation of Hospitalization:  Depression Hallucinations  Estimated Length of Stay: 2-3 days   Attendees: Patient: Rachel [name]  05/06/2016 10:36 AM  Physician: Dr. Kristine Linea, MD  05/06/2016 10:36 AM  Nursing: Shelia Media, RN 05/06/2016  10:36 AM  RN Care Manager: 05/06/2016 10:36 AM  Social Worker: Hampton Abbot, MSW, LCSW-A 05/06/2016 10:36 AM  Recreational Therapist: Princella Ion, LRT/CTRS  05/06/2016 10:36 AM  Other:  05/06/2016 10:36 AM  Other:  05/06/2016 10:36 AM  Other: 05/06/2016 10:36 AM    Scribe for Treatment Team: Lynden Oxford, LCSWA 05/06/2016 10:36 AM

## 2016-05-06 NOTE — [facility] Suicide Risk Assessment (Signed)
[facility] Discharge Suicide Risk Assessment   Principal Problem: Bipolar 2 disorder, major depressive episode [facility]) Discharge Diagnoses:  Patient Active Problem List   Diagnosis Date Noted  . Cerebrovascular disease [I67.9] 05/04/2016  . Bipolar 2 disorder, major depressive episode (HCC) [F31.81] 05/02/2016  . Tobacco use disorder [F17.200] 05/02/2016  . Opioid use disorder, mild, abuse [F11.10] 05/02/2016  . HTN (hypertension) [I10] 05/02/2016    Total Time spent with patient: 30 minutes  Musculoskeletal: Strength & Muscle Tone: within normal limits Gait & Station: normal Patient leans: N/A  Psychiatric Specialty Exam: Review of Systems  Psychiatric/Behavioral: Positive for substance abuse.  All other systems reviewed and are negative.   Blood pressure (!) 166/110, pulse 88, temperature 97.9 F (36.6 C), resp. rate 18, height  (1.702 m), weight 49.9 kg (110 lb), SpO2 100 %.Body mass index is 17.23 kg/m.  General Appearance: Casual  Eye Contact::  Good  Speech:  Clear and Coherent409  Volume:  Normal  Mood:  Angry  Affect:  Congruent  Thought Process:  Goal Directed and Descriptions of Associations: Intact  Orientation:  Full (Time, Place, and Person)  Thought Content:  WDL  Suicidal Thoughts:  No  Homicidal Thoughts:  No  Memory:  Immediate;   Fair Recent;   Fair Remote;   Fair  Judgement:  Impaired  Insight:  Shallow  Psychomotor Activity:  Normal  Concentration:  Fair  Recall:  [employer] of Knowledge:Fair  Language: Fair  Akathisia:  No  Handed:  Right  AIMS (if indicated):     Assets:  Communication Skills Desire for Improvement Financial Resources/Insurance Housing Physical Health Resilience Social Support  Sleep:  Number of Hours: 7  Cognition: WNL  ADL's:  Intact   Mental Status Per Nursing Assessment::   On Admission:     Demographic Factors:  Caucasian, Low socioeconomic status and Unemployed  Loss Factors: NA  Historical  Factors: Prior suicide attempts, Family history of mental illness or substance abuse and Impulsivity  Risk Reduction Factors:   Sense of responsibility to family, Living with another person, especially a relative and Positive social support  Continued Clinical Symptoms:  Bipolar Disorder:   Bipolar II Alcohol/Substance Abuse/Dependencies  Cognitive Features That Contribute To Risk:  None    Suicide Risk:  Minimal: No identifiable suicidal ideation.  Patients presenting with no risk factors but with morbid ruminations; may be classified as minimal risk based on the severity of the depressive symptoms  Follow-up Information    [facility]. Go on 05/06/2016.   Contact information: [address] [place] [address] [phone]           Plan Of Care/Follow-up recommendations:  Activity:  As tolerated. Diet:  Low sodium heart healthy. Other:  Keep follow-up appointments.  Kristine Linea, MD 05/06/2016, 3:14 PM

## 2016-05-06 NOTE — Progress Notes (Signed)
[facility] MD Progress Note  05/06/2016 11:22 AM MARSIA CINO  MRN:  [id number] Subjective:   61 year old female with history of bipolar 2 disorder and substance abuse admitted for disorganized behavior.  05/04/16 patient complains of feeling very "edgy". He says that she is craving a cigarette and that they nicotine patch is not really helping her. She is unable to tell me why she is in the hospital. She denies today having problems with suicidality, homicidality or auditory or visual hallucinations. The patient says that in the past she used to abuse opiates pills and heroine. She says she cannot remember when the last use was doesn't think she used recently. She does not think she is in withdrawals. Patient denies having any side effects from medications or any major physical complaints. Per nursing staff she continues to be confused and gets lost frequently around the unit.  05/05/2016. Ms. Keir is very pleasant with me this morning. She denies any problems. She recognizes me from previous admission the was many years ago. Per staff, she is forgetful and easily agitated.  05/06/2016. Mr. Leng met with treatment team today. She is pleasant and polite complaining of confusion going back 6 months. Apparently, when she experiences nicotine cravings in the afternoon she gets agitated demanding discharge. She received prn medications every afternoon. She may be sundowning. Dementia labs were ordered. We will attempt cognitive testing now when she is not so anxious. She accepts medications and tolerates them well. Good group participation. There are no somatic complaints.   Per nursing: Patient is not as needy during this shift but nevertheless, asked for her husband, confused as to why she's here; improved appearance, denied pain, denied SI/HI, denied AV/H. Pleasant, interacting well with others, removed name from the door name tag, "this is not a prison, is it? I don't want my name up there...."  Patient is  not as needy during this shift but nevertheless, asked for her husband, confused as to why she's here; improved appearance, denied pain, denied SI/HI, denied AV/H. Pleasant, interacting well with others, removed name from the door name tag, "this is not a prison, is it? I don't want my name up there...."   Principal Problem: Bipolar 2 disorder, major depressive episode ([place]) Diagnosis:   Patient Active Problem List   Diagnosis Date Noted  . Cerebrovascular disease [I67.9] 05/04/2016  . Acute delirium [R41.0] 05/04/2016  . Opioid use disorder, severe, dependence ([place]) [F11.20] 05/04/2016  . Bipolar 2 disorder, major depressive episode ([place]) [F31.81] 05/02/2016  . Tobacco use disorder [F17.200] 05/02/2016  . Opioid use disorder, mild, abuse [F11.10] 05/02/2016  . HTN (hypertension) [I10] 05/02/2016   Total Time spent with patient: 30 minutes  Past Psychiatric History: She was hospitalized twice it is at [facility] for depressive episodes. She remembers attempting suicide when she was young. She follows up with [facility] and Dr. Jamse Arn but has been off medications for over a month. The family suspects drug abuse. She did well in the past on a combination of Risperdal and Tegretol.  Past Medical History:  Past Medical History:  Diagnosis Date  . Hepatitis C   . Hypertension    History reviewed. No pertinent surgical history.  Family History: History reviewed. No pertinent family history.  Family Psychiatric  History: Her father and her cousin committed suicide.  Social History: She lives with her husband. Her daughter also lives in the area. They both took commitment papers on the patient. History  Alcohol Use  .  Yes    Comment: rarely     History  Drug Use No    Social History   Social History  . Marital status: Married    Spouse name: N/A  . Number of children: N/A  . Years of education: N/A   Social History Main Topics  . Smoking status: Current Every Day  Smoker    Packs/day: 1.00    Types: Cigarettes  . Smokeless tobacco: Never Used  . Alcohol use Yes     Comment: rarely  . Drug use: No  . Sexual activity: Not Asked   Other Topics Concern  . None   Social History Narrative  . None    Current Medications: Current Facility-Administered Medications  Medication Dose Route Frequency Provider Last Rate Last Dose  . acetaminophen (TYLENOL) tablet 650 mg  650 mg Oral Q6H PRN Marillyn Goren B Christian Borgerding, MD      . alum & mag hydroxide-simeth (MAALOX/MYLANTA) 200-200-20 MG/5ML suspension 30 mL  30 mL Oral Q4H PRN Anaiza Behrens B Jaylah Goodlow, MD      . carbamazepine (TEGRETOL) tablet 200 mg  200 mg Oral BID AC & HS Hermina Barnard B Rino Hosea, MD   200 mg at 05/06/16 0809  . chlorproMAZINE (THORAZINE) injection 100 mg  100 mg Intramuscular QID PRN Wilhelmina Hark B Jurni Cesaro, MD       Or  . chlorproMAZINE (THORAZINE) tablet 100 mg  100 mg Oral QID PRN Clovis Fredrickson, MD      . Derrill Memo ON 05/07/2016] hydrochlorothiazide (HYDRODIURIL) tablet 25 mg  25 mg Oral Daily Khushbu Pippen B Dajha Urquilla, MD      . magnesium hydroxide (MILK OF MAGNESIA) suspension 30 mL  30 mL Oral Daily PRN Caidynce Muzyka B Braydon Kullman, MD      . nicotine (NICODERM CQ - dosed in mg/24 hours) patch 21 mg  21 mg Transdermal Daily Toniette Devera B Jenesa Foresta, MD   21 mg at 05/06/16 0809  . OLANZapine (ZYPREXA) tablet 20 mg  20 mg Oral QHS Kaitland Lewellyn B Jayziah Bankhead, MD        Lab Results:  Results for orders placed or performed during the hospital encounter of 05/03/16 (from the past 48 hour(s))  Folate     Status: None   Collection Time: 05/06/16  9:27 AM  Result Value Ref Range   Folate 17.2 >5.9 ng/mL    Blood Alcohol level:  Lab Results  Component Value Date   ETH <5 05/02/2016   ETH <5 41/32/4401    Metabolic Disorder Labs: Lab Results  Component Value Date   HGBA1C 6.0 (H) 05/04/2016   MPG 126 05/04/2016   No results found for: PROLACTIN Lab Results  Component Value Date   CHOL 210 (H) 05/04/2016    TRIG 89 05/04/2016   HDL 53 05/04/2016   CHOLHDL 4.0 05/04/2016   VLDL 18 05/04/2016   LDLCALC 139 (H) 05/04/2016    Physical Findings: AIMS:  , ,  ,  ,    CIWA:    COWS:     Musculoskeletal: Strength & Muscle Tone: within normal limits Gait & Station: normal Patient leans: N/A  Psychiatric Specialty Exam: Physical Exam  Nursing note and vitals reviewed. Constitutional: She is oriented to person, place, and time.  Neurological: She is oriented to person, place, and time.  Psychiatric: Her speech is normal. Her affect is labile and inappropriate. She is hyperactive. Thought content is paranoid and delusional. Cognition and memory are impaired. She expresses impulsivity.    Review of Systems  Musculoskeletal: Positive for  joint pain.  Psychiatric/Behavioral: Positive for substance abuse. The patient has insomnia.   All other systems reviewed and are negative.   Blood pressure (!) 166/110, pulse 88, temperature 97.9 F (36.6 C), resp. rate 18, height _0  (1.702 m), weight 49.9 kg (110 lb), SpO2 100 %.Body mass index is 17.23 kg/m.  General Appearance: Fairly Groomed  Eye Contact:  Good  Speech:  Clear and Coherent  Volume:  Normal  Mood:  Irritable  Affect:  Constricted  Thought Process:  Linear and Descriptions of Associations: Intact  Orientation:  Full (Time, Place, and Person)  Thought Content:  Hallucinations: None  Suicidal Thoughts:  No  Homicidal Thoughts:  No  Memory:  Immediate;   Poor Recent;   Poor Remote;   Fair  Judgement:  Poor  Insight:  Shallow  Psychomotor Activity:  Normal  Concentration:  Concentration: Fair and Attention Span: Fair  Recall:  [employer] of Knowledge:  Fair  Language:  Good  Akathisia:  No  Handed:    AIMS (if indicated):     Assets:  Physical Health Social Support  ADL's:  Intact  Cognition:  Impaired,  Mild  Sleep:  Number of Hours: 7     Treatment Plan Summary:  Ms. Sandberg is a 61 year old female with history of  bipolar disorder and substance admitted for worsening of her symptoms in the context of treatment noncompliance.  1. Agitation. Thorazine as needed is available.   2. Mood. We restarted Tegretol and increased Zyprexa for mood stabilization.  3. Insomnia. We changed Zyprexa to evening dose.   4. Smoking. Nicotine patch is available.   5. Opioid abuse. She denies symptoms of withdrawals  6. Cerebrovascular disease. 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  7. HTN. Blood pressure still elevated. Will increase HCTZ.   8. Metabolic syndrome monitoring. Lipid panel (elevated total cholesterol at 210, elevated LDL at 139), TSH (1.8 wnl), hemoglobin A1c is elevated at 6.0.   9. EKG. Normal sinus rhythm, QTC 449.   10. Cognitive decline. Will order RPR, HIV, R61 and folic acid levels. Cognitive testing. Head CT:  1. No definite CT evidence for acute intracranial abnormality. 2. Old appearing lacunar infarcts in the left thalamus and basalganglia but new compared with comparison CT from 2009.  11. Disposition. She will be discharged home with her husband after family meeting. She will follow up with [facility].   Orson Slick, MD 05/06/2016, 11:22 AM

## 2016-05-06 NOTE — Plan of Care (Signed)
Problem: Activity: Goal: Sleeping patterns will improve Outcome: Progressing Patient slept for Estimated Hours of 7; every 15 minutes safety round maintained, no injury or falls during this shift.    

## 2016-05-06 NOTE — Progress Notes (Signed)
Pt threw trash all over room and in hallway. Pt agitated because she wants a cigarette. Refused PO PRN medication (pill thrown on floor).    MD / SW contacted pt's husband. Pt to be discharged and picked up at 1700 today.

## 2016-05-06 NOTE — Discharge Summary (Deleted)
Physician Discharge Summary Note  Patient:  Rachel [name] is an 61 y.o., female MRN:  [id number] DOB:  [birth year] Patient phone:  [phone] (home)  Patient address:   [address] [place] [address],  Total Time spent with patient: 30 minutes  Date of Admission:  05/03/2016 Date of Discharge: 05/06/2016  Reason for Admission:  Psychotic break.  Identifying data. Rachel [name] is a 61 year old female with history of bipolar 2 disorder and substance abuse.  Chief complaint. "My husband put me here."  History of present illness. Information was obtained from the patient and the chart. The patient has a history of bipolar 2 disorder but has been off her medications for a month now. She became increasingly confused after she [place] her [place] She reportedly was hallucinating, including visual hallucinations, and paranoid, moody and irritable likely abusing drugs. She was positive for opioids on admission. Her husband petition her on 2/23 for confusion and bizarre behavior. She was discharged to home by tele-psychiatrist. She returned 2 days later on petition from her daughter for agitation and was eventually admitted to [facility] for safety and stabilization. The patient denies any symptoms of depression, anxiety, or psychosis. She seems clueless about the reasons for her admission. She denies alcohol or illicit substance use.  Past psychiatric history. She was hospitalized twice it is at [facility] for depressive episodes. She remembers attempting suicide when she was young. She follows up with [facility] and Dr. Georjean [name] but has been off medications for over a month. The family suspects drug abuse. She did well in the past on a combination of Risperdal and Tegretol.  Family psychiatric history. Her father and her cousin committed suicide.  Social history. She lives with her husband. Her daughter also lives in the area. They  both took commitment papers on the patient.  Principal Problem: Bipolar 2 disorder, major depressive episode [facility]) Discharge Diagnoses: Patient Active Problem List   Diagnosis Date Noted  . Cerebrovascular disease [I67.9] 05/04/2016  . Bipolar 2 disorder, major depressive episode ([facility]) [F31.81] 05/02/2016  . Tobacco use disorder [F17.200] 05/02/2016  . Opioid use disorder, mild, abuse [F11.10] 05/02/2016  . HTN (hypertension) [I10] 05/02/2016   Past Medical History:  Past Medical History:  Diagnosis Date  . Hepatitis C   . Hypertension    History reviewed. No pertinent surgical history. Family History: History reviewed. No pertinent family history.  Social History:  History  Alcohol Use  . Yes    Comment: rarely     History  Drug Use No    Social History   Social History  . Marital status: Married    Spouse name: N/A  . Number of children: N/A  . Years of education: N/A   Social History Main Topics  . Smoking status: Current Every Day Smoker    Packs/day: 1.00    Types: Cigarettes  . Smokeless tobacco: Never Used  . Alcohol use Yes     Comment: rarely  . Drug use: No  . Sexual activity: Not Asked   Other Topics Concern  . None   Social History Narrative  . None    Hospital Course:    Ms. Rachel [name] is a 61 year old female with history of bipolar disorder and substance admitted for worsening of her symptoms in the context of treatment noncompliance.  1. Agitation. Resolved. The patient was upset about cigarettes.    2. Mood. We restarted Tegretol and added  Zyprexa for mood stabilization.  3. Insomnia. Resolved with Zyprexa.    4. Smoking. Nicotine patch is available.   5. Opioid abuse. She denies symptoms of withdrawals and declines substance abuse treatment.  6. Cerebrovascular disease. 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  7. HTN. We started HCTZ.   8. Metabolic syndrome monitoring. Lipid panel (elevated total cholesterol at 210, elevated LDL at 139),  TSH (1.8 wnl), hemoglobin A1c is elevated at 6.0.   9. EKG. Normal sinus rhythm, QTC 449.   10. Cognitive decline. RPR, HIV, B12 and folic acid levels are pending.  11. Head CT: 1. No definite CT evidence for acute intracranial abnormality. 2. Old appearing lacunar infarcts in the left thalamus and basalganglia but new compared with comparison CT from 2009.  12. Psychological Assessment  Name: Rachel [name] Age: 32 Date of Evaluation: 05-06-16 Test(s) Administered: Rachel [name] Gestalt             Trail Making Test Parts A & B  Reason for Referral: her physician, Rachel Linea, MD., referred Rachel [name] for a psychological assessment.  She was admitted to Behavioral Medicine for the treatment of aggressive behavior. She has a history of bipolar disorder and was positive for opioids upon admission. She appears to have experienced audio and visual hallucinations prior to admission as well as periods of confusion and disorientation.   Please see the history and physical for additional background information. An assessment of cognitive functioning was requested.  Validity: Rachel [name] was pleasant and not uncooperative with the testing process. She stated she did not remember being asked to participate in the testing process. She attempted all tasks requested of her. She appeared to try her best.  She was impulsive at times, wanting to start the task before the instructions had been completed. She was redirectable. The present evaluation is considered a valid indication of current functioning.  Trail Making Test - Rachel [name] completed Part A in 47 seconds. The expected time to completion is 27-39 seconds. Rachel [name]' performance suggests possible mild impairment.  Rachel [name]' completed Part B in 90 seconds. The expected time to completion is 66-85 seconds. Rachel [name]' performance suggests mild impairment.  [name] - Rachel [name] completed the task in less than 15 minutes. She obtained 1  errors, closure difficulty.   Diagnostic Impression: Rachel [name]' screening suggests impulsivity. She admits to having difficulty with her memory. Her testing does not currently reflect cognitive deficit.  13. Disposition. She was discharged home with her husband. She will follow up with [facility].  Physical Findings: AIMS:  , ,  ,  ,    CIWA:    COWS:     Musculoskeletal: Strength & Muscle Tone: within normal limits Gait & Station: normal Patient leans: N/A  Psychiatric Specialty Exam: Physical Exam  Nursing note and vitals reviewed. Psychiatric: She has a normal mood and affect. Her speech is normal. Thought content normal. She is hyperactive. Cognition and memory are normal. She expresses impulsivity.    Review of Systems  Psychiatric/Behavioral: Positive for substance abuse.  All other systems reviewed and are negative.   Blood pressure (!) 166/110, pulse 88, temperature 97.9 F (36.6 C), resp. rate 18, height  (1.702 m), weight 49.9 kg (110 lb), SpO2 100 %.Body mass index is 17.23 kg/m.  General Appearance: Casual  Eye Contact:  Good  Speech:  Clear and Coherent  Volume:  Normal  Mood:  Angry  Affect:  Congruent  Thought Process:  Goal Directed and Descriptions of Associations: Intact  Orientation:  Full (Time, Place, and Person)  Thought Content:  WDL  Suicidal Thoughts:  No  Homicidal Thoughts:  No  Memory:  Immediate;   Fair Recent;   Fair Remote;   Fair  Judgement:  Impaired  Insight:  Shallow  Psychomotor Activity:  Normal  Concentration:  Concentration: Fair and Attention Span: Fair  Recall:  [employer] of Knowledge:  Fair  Language:  Fair  Akathisia:  No  Handed:  Right  AIMS (if indicated):     Assets:  Communication Skills Desire for Improvement Financial Resources/Insurance Housing Physical Health Resilience Social Support  ADL's:  Intact  Cognition:  WNL  Sleep:  Number of Hours: 7     Have you used any form of tobacco in the last 30  days? (Cigarettes, Smokeless Tobacco, Cigars, and/or Pipes): Yes  Has this patient used any form of tobacco in the last 30 days? (Cigarettes, Smokeless Tobacco, Cigars, and/or Pipes) Yes, Yes, A prescription for an FDA-approved tobacco cessation medication was offered at discharge and the patient refused  Blood Alcohol level:  Lab Results  Component Value Date   [facility] <5 05/02/2016   ETH <5 04/24/2016    Metabolic Disorder Labs:  Lab Results  Component Value Date   HGBA1C 6.0 (H) 05/04/2016   MPG 126 05/04/2016   No results found for: PROLACTIN Lab Results  Component Value Date   CHOL 210 (H) 05/04/2016   TRIG 89 05/04/2016   HDL 53 05/04/2016   CHOLHDL 4.0 05/04/2016   VLDL 18 05/04/2016   LDLCALC 139 (H) 05/04/2016    See Psychiatric Specialty Exam and Suicide Risk Assessment completed by Attending Physician prior to discharge.  Discharge destination:  Home  Is patient on multiple antipsychotic therapies at discharge:  No   Has Patient had three or more failed trials of antipsychotic monotherapy by history:  No  Recommended Plan for Multiple Antipsychotic Therapies: NA  Discharge Instructions    Diet - low sodium heart healthy    Complete by:  As directed    Increase activity slowly    Complete by:  As directed      Allergies as of 05/06/2016      Reactions   Tetracyclines & Related Rash      Medication List    TAKE these medications     Indication  carbamazepine 200 MG tablet Commonly known as:  TEGRETOL Take 1 tablet (200 mg total) by mouth 2 (two) times daily at 8 am and 10 pm.  Indication:  Manic-Depression   hydrochlorothiazide 25 MG tablet Commonly known as:  HYDRODIURIL Take 1 tablet (25 mg total) by mouth daily. Start taking on:  05/07/2016  Indication:  High Blood Pressure Disorder   OLANZapine 20 MG tablet Commonly known as:  ZYPREXA Take 1 tablet (20 mg total) by mouth at bedtime.  Indication:  Manic-Depression      Follow-up Information     [facility] [employer]. Go on 05/08/2016.   Why:  Please follow-up with [facility] for your medication managment and hospital discharge assessment on April 6th at 12:30 PM. Please bring discharge paperwork to this appointment. If you have questions, contact office directly.  Contact information: [address] [place] [address] [phone]           Follow-up recommendations:  Activity:  As tolerated. Diet:  Low sodium heart healthy. Other:  Keep follow-up appointments.  Comments:    Signed: Kristine Linea, MD 05/06/2016, 3:17 PM

## 2016-05-06 NOTE — Progress Notes (Signed)
Recreation Therapy Notes  Date: 04.04.18 Time: 9:30 am Location: Craft Room  Group Topic: Self-esteem  Goal Area(s) Addresses:  Patients will write at least one positive trait about themselves. Patients will verbalize benefit of having a healthy self-esteem.  Behavioral Response: Attentive  Intervention: I Am  Activity: Patients were given a worksheet with the letter I on it and were instructed to write as many positive traits about themselves inside the letter.  Education: LRT educated patients on ways they can improve their self-esteem.  Education Outcome: In group clarification offered   Clinical Observations/Feedback: Patient wrote positive traits about self. Patient did not contribute to group discussion.  Jacquelynn Cree, LRT/CTRS 05/06/2016 10:08 AM

## 2016-05-06 NOTE — [facility] Suicide Risk Assessment (Signed)
[facility] INPATIENT:  Family/Significant Other Suicide Prevention Education  Suicide Prevention Education:  Education Completed; husband, Quetzaly Ebner ph#: [phone] has been identified by the patient as the family member/significant other with whom the patient will be residing, and identified as the person(s) who will aid the patient in the event of a mental health crisis (suicidal ideations/suicide attempt).  With written consent from the patient, the family member/significant other has been provided the following suicide prevention education, prior to the and/or following the discharge of the patient.  The suicide prevention education provided includes the following:  Suicide risk factors  Suicide prevention and interventions  National Suicide Hotline telephone number  [facility] assessment telephone number  [facility] Emergency Assistance 911  [facility] and/or Residential Mobile Crisis Unit telephone number  Request made of family/significant other to:  Remove weapons (e.g., guns, rifles, knives), all items previously/currently identified as safety concern.    Remove drugs/medications (over-the-counter, prescriptions, illicit drugs), all items previously/currently identified as a safety concern.  The family member/significant other verbalizes understanding of the suicide prevention education information provided.  The family member/significant other agrees to remove the items of safety concern listed above.  Lynden Oxford, MSW, LCSW-A 05/06/2016, 3:18 PM

## 2016-05-06 NOTE — Progress Notes (Signed)
Patient discharged home. DC instructions provided and explained. Medications reviewed. Rx given. All questions answered. Denies SI, HI, AVH. Belongings returned. AVS, Suicide Risk assessment, transition record given to patient. Patient stable at discharge.

## 2016-05-06 NOTE — [facility] Group Notes (Signed)
[facility] LCSW Group Therapy  05/06/2016 1:35 PM  Type of Therapy:  Group Therapy  Participation Level:  Minimal  Participation Quality:  Attentive  Affect:  Appropriate  Cognitive:  Confused  Insight:  Limited  Engagement in Therapy:  Limited  Modes of Intervention:  Discussion, Education, Problem-solving, Reality Testing and Support  Summary of Progress/Problems:Emotional Regulation: Patients will identify both negative and positive emotions. They will discuss emotions they have difficulty regulating and how they impact their lives. Patients will be asked to identify healthy coping skills to combat unhealthy reactions to negative emotions. Patient discussed being grateful for the support she has from her husband and daughter. Patient was confused about what day of the week it was and stated she had only been in the hospital for one day. CSW provided support to patient and clarified how long she has been in the hospital and who her assigned doctor was.   Rachel [name] Czech MSW, LCSWA 05/06/2016, 1:38 PM

## 2016-05-06 NOTE — Progress Notes (Signed)
  [address] [facility] Adult Case Management Discharge Plan :  Will you be returning to the same living situation after discharge:  Yes,  home with husband. At discharge, do you have transportation home?: Yes,  husband will pick pt up. Do you have the ability to pay for your medications: Yes,  [employer] Medicare  Release of information consent forms completed and in the chart;  Patient's signature needed at discharge.  Patient to Follow up at: Follow-up Information    [facility]. Go on 05/08/2016.   Why:  Please follow-up with [facility] for your medication managment and hospital discharge assessment on April 6th at 12:30 PM. Please bring discharge paperwork to this appointment. If you have questions, contact office directly.  Contact information: [address] [place] [address] [phone]           Next level of care provider has access to [facility] Link:no  Safety Planning and Suicide Prevention discussed: Yes, SPE completed with husband.   Have you used any form of tobacco in the last 30 days? (Cigarettes, Smokeless Tobacco, Cigars, and/or Pipes): Yes  Has patient been referred to the Quitline?: Pt refused referral.   Patient has been referred for addiction treatment: Pt. refused referral  Lynden Oxford, MSW, LCSW-A 05/06/2016, 3:16 PM

## 2016-05-06 NOTE — [facility] Group Notes (Signed)
[facility] Group Notes:  (Nursing/MHT/Case Management/Adjunct)  Date:  05/06/2016  Time:  5:20 PM  Type of Therapy:  Psychoeducational Skills  Participation Level:  Did Not Attend   Lynelle Smoke [facility] 05/06/2016, 5:20 PM

## 2016-05-06 NOTE — Progress Notes (Signed)
Patient ID: Rachel [name], female   DOB: 04-26-1955, 61 y.o.   MRN: [id number] Patient is not as needy during this shift but nevertheless, asked for her husband, confused as to why she's here; improved appearance, denied pain, denied SI/HI, denied AV/H. Pleasant, interacting well with others, removed name from the door name tag, "this is not a prison, is it? I don't want my name up there...Marland Kitchen"

## 2016-05-07 LAB — HIV ANTIBODY (ROUTINE TESTING W REFLEX): HIV Screen 4th Generation wRfx: NONREACTIVE

## 2016-05-07 LAB — RPR: RPR: NONREACTIVE

## 2016-05-07 NOTE — Progress Notes (Signed)
Recreation Therapy Notes  INPATIENT RECREATION TR PLAN  Patient Details Name: Rachel [name] MRN: [id number] DOB: 11/09/1955 Today's Date: 05/07/2016  Rec Therapy Plan Is patient appropriate for Therapeutic Recreation?: Yes Treatment times per week: At least once a week TR Treatment/Interventions: 1:1 session, Group participation (Comment) (Appropriate participation in daily recreational therapy tx)  Discharge Criteria Pt will be discharged from therapy if:: Treatment goals are met, Discharged Treatment plan/goals/alternatives discussed and agreed upon by:: Patient/family  Discharge Summary Short term goals set: See Care Plan Short term goals met: Adequate for discharge Progress toward goals comments: One-to-one attended Which groups?: Self-esteem, Other (Comment) (Self-expression) One-to-one attended: Self-esteem, stress management Reason goals not met: Patient d/c before goal could be met Therapeutic equipment acquired: None Reason patient discharged from therapy: Discharge from hospital Pt/family agrees with progress & goals achieved: Yes Date patient discharged from therapy: 05/06/16   Leonette Monarch, LRT/CTRS 05/07/2016, 8:20 AM

## 2016-09-21 ENCOUNTER — Encounter: Payer: Self-pay | Admitting: Pharmacist

## 2018-02-23 ENCOUNTER — Emergency Department: Payer: Medicare HMO

## 2018-02-23 ENCOUNTER — Emergency Department
Admission: EM | Admit: 2018-02-23 | Discharge: 2018-02-23 | Disposition: A | Discharge status: Home / Self Care | Payer: Medicare HMO | Attending: Emergency Medicine | Admitting: Emergency Medicine

## 2018-02-23 ENCOUNTER — Encounter: Payer: Self-pay | Admitting: *Deleted

## 2018-02-23 ENCOUNTER — Other Ambulatory Visit: Payer: Self-pay

## 2018-02-23 DIAGNOSIS — R200 Anesthesia of skin: Secondary | ICD-10-CM | POA: Diagnosis not present

## 2018-02-23 DIAGNOSIS — I10 Essential (primary) hypertension: Secondary | ICD-10-CM | POA: Insufficient documentation

## 2018-02-23 DIAGNOSIS — Z79899 Other long term (current) drug therapy: Secondary | ICD-10-CM | POA: Insufficient documentation

## 2018-02-23 DIAGNOSIS — F17210 Nicotine dependence, cigarettes, uncomplicated: Secondary | ICD-10-CM | POA: Insufficient documentation

## 2018-02-23 DIAGNOSIS — G459 Transient cerebral ischemic attack, unspecified: Secondary | ICD-10-CM | POA: Insufficient documentation

## 2018-02-23 DIAGNOSIS — R69 Illness, unspecified: Secondary | ICD-10-CM | POA: Diagnosis not present

## 2018-02-23 DIAGNOSIS — Z7982 Long term (current) use of aspirin: Secondary | ICD-10-CM | POA: Diagnosis not present

## 2018-02-23 DIAGNOSIS — R202 Paresthesia of skin: Secondary | ICD-10-CM | POA: Diagnosis not present

## 2018-02-23 LAB — CBC
HCT: 43.4 % (ref 36.0–46.0)
Hemoglobin: 14.4 g/dL (ref 12.0–15.0)
MCH: 27.9 pg (ref 26.0–34.0)
MCHC: 33.2 g/dL (ref 30.0–36.0)
MCV: 84.1 fL (ref 80.0–100.0)
Platelets: 307 10*3/uL (ref 150–400)
RBC: 5.16 MIL/uL — ABNORMAL HIGH (ref 3.87–5.11)
RDW: 13.8 % (ref 11.5–15.5)
WBC: 8.3 10*3/uL (ref 4.0–10.5)
nRBC: 0 % (ref 0.0–0.2)

## 2018-02-23 LAB — BASIC METABOLIC PANEL
Anion gap: 8 (ref 5–15)
BUN: 8 mg/dL (ref 8–23)
CO2: 24 mmol/L (ref 22–32)
CREATININE: 0.7 mg/dL (ref 0.44–1.00)
Calcium: 8.4 mg/dL — ABNORMAL LOW (ref 8.9–10.3)
Chloride: 106 mmol/L (ref 98–111)
GFR calc Af Amer: >60 mL/min (ref >60)
GFR calc non Af Amer: >60 mL/min (ref >60)
Glucose, Bld: 91 mg/dL (ref 70–99)
Potassium: 3.3 mmol/L — ABNORMAL LOW (ref 3.5–5.1)
SODIUM: 138 mmol/L (ref 135–145)

## 2018-02-23 LAB — URINALYSIS, COMPLETE (UACMP) WITH MICROSCOPIC
BACTERIA UA: NONE SEEN
BILIRUBIN URINE: NEGATIVE
Glucose, UA: NEGATIVE mg/dL
Ketones, ur: NEGATIVE mg/dL
Leukocytes, UA: NEGATIVE
NITRITE: NEGATIVE
PH: 7 (ref 5.0–8.0)
Protein, ur: NEGATIVE mg/dL
SPECIFIC GRAVITY, URINE: 1.001 — AB (ref 1.005–1.030)

## 2018-02-23 LAB — GLUCOSE, CAPILLARY: Glucose-Capillary: 84 mg/dL (ref 70–99)

## 2018-02-23 MED ORDER — ASPIRIN 81 MG PO TABS: 81.0000 mg | ORAL_TABLET | Freq: Every day | ORAL | 0 refills | Status: DC

## 2018-02-23 MED ORDER — SODIUM CHLORIDE 0.9% FLUSH: 3.0000 mL | Freq: Once | INTRAVENOUS | Status: DC

## 2018-02-23 NOTE — ED Notes (Signed)
Patient transported to CT 

## 2018-02-23 NOTE — ED Provider Notes (Signed)
[facility] Emergency Department Provider Note  ____________________________________________  Time seen: Approximately 9:29 PM  I have reviewed the triage vital signs and the nursing notes.   HISTORY  Chief Complaint Numbness   HPI Rachel [name] is a 63 y.o. female for history of smoking, bipolar disorder, hypertension, hepatitis C who presents for evaluation of numbness.  Patient reports that she was watching TV with her husband when she noticed some numbness located on the right lip and gum area which lasted a few minutes and resolved without intervention.  Husband tells me the patient was also complaining that her right arm felt numb however patient says that she does not remember her arm feeling numb.  No facial droop, no slurred speech, no extremity weakness, no chest pain, no headache.  No prior history of stroke.   Past Medical History:  Diagnosis Date  . Hepatitis C   . Hypertension     Patient Active Problem List   Diagnosis Date Noted  . Vitamin B12 deficiency 05/06/2016  . Cerebrovascular disease 05/04/2016  . Bipolar 2 disorder, major depressive episode (HCC) 05/02/2016  . Tobacco use disorder 05/02/2016  . Opioid use disorder, mild, abuse (HCC) 05/02/2016  . HTN (hypertension) 05/02/2016    History reviewed. No pertinent surgical history.  Prior to Admission medications   Medication Sig Start Date End Date Taking? Authorizing Provider  aspirin 81 MG tablet Take 1 tablet (81 mg total) by mouth daily. 02/23/18   Nita Sickle, MD  carbamazepine (TEGRETOL) 200 MG tablet Take 1 tablet (200 mg total) by mouth 2 (two) times daily at 8 am and 10 pm. 05/06/16   Pucilowska, Jolanta B, MD  cyanocobalamin 1000 MCG tablet Take 1 tablet (1,000 mcg total) by mouth daily. 05/06/16   Pucilowska, Jolanta B, MD  hydrochlorothiazide (HYDRODIURIL) 25 MG tablet Take 1 tablet (25 mg total) by mouth daily. 05/07/16   Pucilowska, Jolanta B, MD  OLANZapine  (ZYPREXA) 20 MG tablet Take 1 tablet (20 mg total) by mouth at bedtime. 05/06/16   Pucilowska, Ellin Goodie, MD    Allergies Tetracyclines & related  History reviewed. No pertinent family history.  Social History Social History   Tobacco Use  . Smoking status: Current Every Day Smoker    Packs/day: 1.00    Types: Cigarettes  . Smokeless tobacco: Never Used  Substance Use Topics  . Alcohol use: Yes    Comment: rarely  . Drug use: No    Review of Systems  Constitutional: Negative for fever. Eyes: Negative for visual changes. ENT: Negative for sore throat. Neck: No neck pain  Cardiovascular: Negative for chest pain. Respiratory: Negative for shortness of breath. Gastrointestinal: Negative for abdominal pain, vomiting or diarrhea. Genitourinary: Negative for dysuria. Musculoskeletal: Negative for back pain. Skin: Negative for rash. Neurological: Negative for headaches. + R lip numbness Psych: No SI or HI  ____________________________________________   PHYSICAL EXAM:  VITAL SIGNS: ED Triage Vitals  Enc Vitals Group     BP 02/23/18 1959 (!) 130/111     Pulse Rate 02/23/18 1959 70     Resp 02/23/18 1959 16     Temp 02/23/18 1959 (!) 97.4 F (36.3 C)     Temp Source 02/23/18 1959 Oral     SpO2 02/23/18 1959 97 %     Weight 02/23/18 1954 100 lb (45.4 kg)     Height 02/23/18 1954 5\' 7"  (1.702 m)     Head Circumference --      Peak  Flow --      Pain Score 02/23/18 1954 0     Pain Loc --      Pain Edu? --      Excl. in GC? --     Constitutional: Alert and oriented. Well appearing and in no apparent distress. HEENT:      Head: Normocephalic and atraumatic.         Eyes: Conjunctivae are normal. Sclera is non-icteric.       Mouth/Throat: Mucous membranes are moist.       Neck: Supple with no signs of meningismus. Cardiovascular: Regular rate and rhythm. No murmurs, gallops, or rubs. 2+ symmetrical distal pulses are present in all extremities. No JVD. Respiratory:  Normal respiratory effort. Lungs are clear to auscultation bilaterally. No wheezes, crackles, or rhonchi.  Gastrointestinal: Soft, non tender, and non distended with positive bowel sounds. No rebound or guarding. Musculoskeletal: Nontender with normal range of motion in all extremities. No edema, cyanosis, or erythema of extremities. Neurologic: Normal speech and language. A & O x3, PERRL, EOMI, no nystagmus, CN II-XII intact, motor testing reveals good tone and bulk throughout. There is no evidence of pronator drift or dysmetria. Muscle strength is 5/5 throughout. Deep tendon reflexes are 2+ throughout with downgoing toes. Sensory examination is intact. Gait is normal. Skin: Skin is warm, dry and intact. No rash noted. Psychiatric: Mood and affect are normal. Speech and behavior are normal.  ____________________________________________   LABS (all labs ordered are listed, but only abnormal results are displayed)  Labs Reviewed  BASIC METABOLIC PANEL - Abnormal; Notable for the following components:      Result Value   Potassium 3.3 (*)    Calcium 8.4 (*)    All other components within normal limits  CBC - Abnormal; Notable for the following components:   RBC 5.16 (*)    All other components within normal limits  URINALYSIS, COMPLETE (UACMP) WITH MICROSCOPIC - Abnormal; Notable for the following components:   Color, Urine COLORLESS (*)    APPearance CLEAR (*)    Specific Gravity, Urine 1.001 (*)    Hgb urine dipstick SMALL (*)    All other components within normal limits  GLUCOSE, CAPILLARY  CBG MONITORING, ED  CBG MONITORING, ED   ____________________________________________  EKG  ED ECG REPORT I, Nita Sickle, the attending physician, personally viewed and interpreted this ECG.  Normal sinus rhythm, rate of 63, normal intervals, normal axis, slight ST depressions on inferior lateral leads with no ST elevation.  Unchanged from  prior. ____________________________________________  RADIOLOGY  I have personally reviewed the images performed during this visit and I agree with the Radiologist's read.   Interpretation by Radiologist:  Ct Head Wo Contrast  Result Date: 02/23/2018 CLINICAL DATA:  Right upper extremity numbness and weakness EXAM: CT HEAD WITHOUT CONTRAST TECHNIQUE: Contiguous axial images were obtained from the base of the skull through the vertex without intravenous contrast. COMPARISON:  Head CT 04/24/2016 FINDINGS: Brain: There is no mass, hemorrhage or extra-axial collection. The size and configuration of the ventricles and extra-axial CSF spaces are normal. There is hypoattenuation of the white matter, most commonly indicating chronic small vessel disease. Old bilateral thalamic lacunar infarcts. Vascular: No abnormal hyperdensity of the major intracranial arteries or dural venous sinuses. No intracranial atherosclerosis. Skull: The visualized skull base, calvarium and extracranial soft tissues are normal. Sinuses/Orbits: No fluid levels or advanced mucosal thickening of the visualized paranasal sinuses. No mastoid or middle ear effusion. The orbits are normal. IMPRESSION:  Chronic small vessel ischemia without acute intracranial abnormality. Electronically Signed   By: Deatra RobinsonKevin  Herman M.D.   On: 02/23/2018 20:51     ____________________________________________   PROCEDURES  Procedure(s) performed: None Procedures Critical Care performed:  None ____________________________________________   INITIAL IMPRESSION / ASSESSMENT AND PLAN / ED COURSE   63 y.o. female for history of smoking, bipolar disorder, hypertension, hepatitis C who presents for evaluation of a brief episode lasting a few minutes of R lip numbness and R arm numbness with no other neuro deficits.  Patient arrives neurologically intact and very upset that her husband called 911.  She keeps saying "there is nothing wrong with me and I just  want to go home".  Her husband arrived and after talking with him she agrees to be evaluated.  She has never had a stroke before.  EKG showing no evidence of acute ischemia.  Vitals are within normal limits.  Labs with no acute findings.  CT head negative for stroke.  Patient is demanding to go home and does not wish to undergo any further evaluation at this time.  I explained to her that her symptoms could have been of a TIA and I would recommend Rachel MRI and further evaluation inpatient however she refuses and wishes to go home.  Recommended close follow-up with her primary care doctor.  Will start patient on aspirin in the meantime.  Discussed signs and symptoms of stroke with patient and her husband and recommended return to the emergency room if these develop.      As part of my medical decision making, I reviewed the following data within the electronic MEDICAL RECORD NUMBER Nursing notes reviewed and incorporated, Labs reviewed , EKG interpreted , Old EKG reviewed, Old chart reviewed, Radiograph reviewed , Notes from prior ED visits and [place] Controlled Substance Database    Pertinent labs & imaging results that were available during my care of the patient were reviewed by me and considered in my medical decision making (see chart for details).    ____________________________________________   FINAL CLINICAL IMPRESSION(S) / ED DIAGNOSES  Final diagnoses:  TIA (transient ischemic attack)      NEW MEDICATIONS STARTED DURING THIS VISIT:  ED Discharge Orders         Ordered    aspirin 81 MG tablet  Daily     02/23/18 2134           Note:  This document was prepared using Dragon voice recognition software and may include unintentional dictation errors.    Nita SickleVeronese, [place], MD 02/23/18 2137

## 2018-02-23 NOTE — ED Triage Notes (Addendum)
Pt to Ed via EMS reporting numbness in right hand and arm with right sided weakness. EMS states pt had a TIA 2 years ago without deficits. Stroke screen with EMS was negative. No slurred speech, facial droop or neurologic deficits noted upon arrival. Per EMS pt has not been taking HTN medications. Pt continues to verbalize that she wants to go home and she "only came to the ED because my husband made me"   Pt is currently withdrawing from nicotine.

## 2018-02-23 NOTE — ED Notes (Signed)
Patient verbalized understanding of discharge instructions, no questions. Patient ambulated out of ED with steady gait in no distress.  

## 2018-06-07 ENCOUNTER — Inpatient Hospital Stay: Payer: Medicare HMO

## 2018-06-07 ENCOUNTER — Inpatient Hospital Stay
Admission: EM | Admit: 2018-06-07 | Discharge: 2018-06-10 | DRG: 062 | Disposition: A | Discharge status: Home Health | Payer: Medicare HMO | Attending: Internal Medicine | Admitting: Internal Medicine

## 2018-06-07 ENCOUNTER — Other Ambulatory Visit: Payer: Self-pay

## 2018-06-07 ENCOUNTER — Emergency Department: Payer: Medicare HMO

## 2018-06-07 DIAGNOSIS — F3181 Bipolar II disorder: Secondary | ICD-10-CM | POA: Diagnosis present

## 2018-06-07 DIAGNOSIS — G8191 Hemiplegia, unspecified affecting right dominant side: Secondary | ICD-10-CM | POA: Diagnosis not present

## 2018-06-07 DIAGNOSIS — R262 Difficulty in walking, not elsewhere classified: Secondary | ICD-10-CM | POA: Diagnosis not present

## 2018-06-07 DIAGNOSIS — F17210 Nicotine dependence, cigarettes, uncomplicated: Secondary | ICD-10-CM | POA: Diagnosis present

## 2018-06-07 DIAGNOSIS — R471 Dysarthria and anarthria: Secondary | ICD-10-CM | POA: Diagnosis present

## 2018-06-07 DIAGNOSIS — I639 Cerebral infarction, unspecified: Principal | ICD-10-CM

## 2018-06-07 DIAGNOSIS — I351 Nonrheumatic aortic (valve) insufficiency: Secondary | ICD-10-CM | POA: Diagnosis not present

## 2018-06-07 DIAGNOSIS — R4781 Slurred speech: Secondary | ICD-10-CM | POA: Diagnosis not present

## 2018-06-07 DIAGNOSIS — Z881 Allergy status to other antibiotic agents status: Secondary | ICD-10-CM | POA: Diagnosis not present

## 2018-06-07 DIAGNOSIS — F429 Obsessive-compulsive disorder, unspecified: Secondary | ICD-10-CM | POA: Diagnosis present

## 2018-06-07 DIAGNOSIS — R29704 NIHSS score 4: Secondary | ICD-10-CM | POA: Diagnosis present

## 2018-06-07 DIAGNOSIS — F419 Anxiety disorder, unspecified: Secondary | ICD-10-CM | POA: Diagnosis present

## 2018-06-07 DIAGNOSIS — I10 Essential (primary) hypertension: Secondary | ICD-10-CM | POA: Diagnosis present

## 2018-06-07 DIAGNOSIS — Z8673 Personal history of transient ischemic attack (TIA), and cerebral infarction without residual deficits: Secondary | ICD-10-CM

## 2018-06-07 DIAGNOSIS — Z1159 Encounter for screening for other viral diseases: Secondary | ICD-10-CM

## 2018-06-07 DIAGNOSIS — Z9114 Patient's other noncompliance with medication regimen: Secondary | ICD-10-CM | POA: Diagnosis not present

## 2018-06-07 DIAGNOSIS — B1920 Unspecified viral hepatitis C without hepatic coma: Secondary | ICD-10-CM | POA: Diagnosis present

## 2018-06-07 DIAGNOSIS — R29810 Facial weakness: Secondary | ICD-10-CM | POA: Diagnosis present

## 2018-06-07 DIAGNOSIS — Z66 Do not resuscitate: Secondary | ICD-10-CM | POA: Diagnosis present

## 2018-06-07 DIAGNOSIS — E785 Hyperlipidemia, unspecified: Secondary | ICD-10-CM | POA: Diagnosis present

## 2018-06-07 DIAGNOSIS — Z880 Allergy status to penicillin: Secondary | ICD-10-CM

## 2018-06-07 DIAGNOSIS — F17213 Nicotine dependence, cigarettes, with withdrawal: Secondary | ICD-10-CM | POA: Diagnosis not present

## 2018-06-07 DIAGNOSIS — Z79899 Other long term (current) drug therapy: Secondary | ICD-10-CM

## 2018-06-07 DIAGNOSIS — F0390 Unspecified dementia without behavioral disturbance: Secondary | ICD-10-CM | POA: Diagnosis present

## 2018-06-07 DIAGNOSIS — Z9181 History of falling: Secondary | ICD-10-CM | POA: Diagnosis not present

## 2018-06-07 DIAGNOSIS — Z7982 Long term (current) use of aspirin: Secondary | ICD-10-CM

## 2018-06-07 DIAGNOSIS — Z9119 Patient's noncompliance with other medical treatment and regimen: Secondary | ICD-10-CM | POA: Diagnosis not present

## 2018-06-07 DIAGNOSIS — I6523 Occlusion and stenosis of bilateral carotid arteries: Secondary | ICD-10-CM | POA: Diagnosis not present

## 2018-06-07 DIAGNOSIS — R69 Illness, unspecified: Secondary | ICD-10-CM | POA: Diagnosis not present

## 2018-06-07 DIAGNOSIS — Z720 Tobacco use: Secondary | ICD-10-CM | POA: Diagnosis not present

## 2018-06-07 DIAGNOSIS — R451 Restlessness and agitation: Secondary | ICD-10-CM | POA: Diagnosis not present

## 2018-06-07 DIAGNOSIS — I340 Nonrheumatic mitral (valve) insufficiency: Secondary | ICD-10-CM | POA: Diagnosis not present

## 2018-06-07 LAB — CBC
HCT: 42.2 % (ref 36.0–46.0)
Hemoglobin: 13.6 g/dL (ref 12.0–15.0)
MCH: 28.1 pg (ref 26.0–34.0)
MCHC: 32.2 g/dL (ref 30.0–36.0)
MCV: 87.2 fL (ref 80.0–100.0)
Platelets: 308 10*3/uL (ref 150–400)
RBC: 4.84 MIL/uL (ref 3.87–5.11)
RDW: 13.7 % (ref 11.5–15.5)
WBC: 8.8 10*3/uL (ref 4.0–10.5)
nRBC: 0 % (ref 0.0–0.2)

## 2018-06-07 LAB — COMPREHENSIVE METABOLIC PANEL
ALT: 14 U/L (ref 0–44)
AST: 14 U/L — ABNORMAL LOW (ref 15–41)
Albumin: 3.7 g/dL (ref 3.5–5.0)
Alkaline Phosphatase: 108 U/L (ref 38–126)
Anion gap: 9 (ref 5–15)
BUN: 20 mg/dL (ref 8–23)
CO2: 28 mmol/L (ref 22–32)
Calcium: 8.7 mg/dL — ABNORMAL LOW (ref 8.9–10.3)
Chloride: 105 mmol/L (ref 98–111)
Creatinine, Ser: 0.89 mg/dL (ref 0.44–1.00)
GFR calc Af Amer: >60 mL/min (ref >60)
GFR calc non Af Amer: >60 mL/min (ref >60)
Glucose, Bld: 108 mg/dL — ABNORMAL HIGH (ref 70–99)
Potassium: 3.7 mmol/L (ref 3.5–5.1)
Sodium: 142 mmol/L (ref 135–145)
Total Bilirubin: 0.3 mg/dL (ref 0.3–1.2)
Total Protein: 6.9 g/dL (ref 6.5–8.1)

## 2018-06-07 LAB — DIFFERENTIAL
Abs Immature Granulocytes: 0.02 10*3/uL (ref 0.00–0.07)
Basophils Absolute: 0 10*3/uL (ref 0.0–0.1)
Basophils Relative: 1 %
Eosinophils Absolute: 0.2 10*3/uL (ref 0.0–0.5)
Eosinophils Relative: 2 %
Immature Granulocytes: 0 %
Lymphocytes Relative: 17 %
Lymphs Abs: 1.5 10*3/uL (ref 0.7–4.0)
Monocytes Absolute: 0.6 10*3/uL (ref 0.1–1.0)
Monocytes Relative: 7 %
Neutro Abs: 6.5 10*3/uL (ref 1.7–7.7)
Neutrophils Relative %: 73 %

## 2018-06-07 LAB — GLUCOSE, CAPILLARY
Glucose-Capillary: 104 mg/dL — ABNORMAL HIGH (ref 70–99)
Glucose-Capillary: 116 mg/dL — ABNORMAL HIGH (ref 70–99)

## 2018-06-07 LAB — ETHANOL: Alcohol, Ethyl (B): <10 mg/dL (ref <10)

## 2018-06-07 LAB — MRSA PCR SCREENING: MRSA by PCR: NEGATIVE

## 2018-06-07 LAB — SARS CORONAVIRUS 2 BY RT PCR (HOSPITAL ORDER, PERFORMED IN ~~LOC~~ HOSPITAL LAB): SARS Coronavirus 2: NEGATIVE

## 2018-06-07 LAB — APTT: aPTT: 26 seconds (ref 24–36)

## 2018-06-07 LAB — PROTIME-INR
INR: 0.9 (ref 0.8–1.2)
Prothrombin Time: 12.5 seconds (ref 11.4–15.2)

## 2018-06-07 MED ORDER — ACETAMINOPHEN 650 MG RE SUPP: 650.0000 mg | RECTAL | Status: DC | PRN

## 2018-06-07 MED ORDER — SODIUM CHLORIDE 0.9 % IV SOLN
INTRAVENOUS | Status: DC
  Administered 2018-06-07 – 2018-06-08 (×2): via INTRAVENOUS

## 2018-06-07 MED ORDER — NICOTINE POLACRILEX 2 MG MT GUM
2.0000 mg | CHEWING_GUM | OROMUCOSAL | Status: DC | PRN
  Administered 2018-06-09: 2 mg via ORAL
  Filled 2018-06-07 (×2): qty 1

## 2018-06-07 MED ORDER — ALTEPLASE 100 MG IV SOLR
INTRAVENOUS | Status: AC
  Administered 2018-06-07: 11:00:00 46.2 mg via INTRAVENOUS
  Filled 2018-06-07: qty 100

## 2018-06-07 MED ORDER — SODIUM CHLORIDE 0.9 % IV SOLN
50.0000 mL | Freq: Once | INTRAVENOUS | Status: AC
  Administered 2018-06-07: 12:00:00 50 mL via INTRAVENOUS

## 2018-06-07 MED ORDER — PANTOPRAZOLE SODIUM 40 MG IV SOLR
40.0000 mg | Freq: Every day | INTRAVENOUS | Status: DC
  Administered 2018-06-07: 40 mg via INTRAVENOUS
  Filled 2018-06-07: qty 40

## 2018-06-07 MED ORDER — ACETAMINOPHEN 325 MG PO TABS
650.0000 mg | ORAL_TABLET | ORAL | Status: DC | PRN
  Administered 2018-06-09: 21:00:00 650 mg via ORAL
  Filled 2018-06-07: qty 2

## 2018-06-07 MED ORDER — STROKE: EARLY STAGES OF RECOVERY BOOK
Freq: Once | Status: AC
  Administered 2018-06-08: 21:00:00

## 2018-06-07 MED ORDER — LORAZEPAM 2 MG/ML IJ SOLN
2.0000 mg | Freq: Once | INTRAMUSCULAR | Status: AC
  Administered 2018-06-07: 2 mg via INTRAVENOUS
  Filled 2018-06-07: qty 1

## 2018-06-07 MED ORDER — SENNOSIDES-DOCUSATE SODIUM 8.6-50 MG PO TABS: 1.0000 | ORAL_TABLET | Freq: Every evening | ORAL | Status: DC | PRN

## 2018-06-07 MED ORDER — ALTEPLASE (STROKE) FULL DOSE INFUSION
0.9000 mg/kg | Freq: Once | INTRAVENOUS | Status: AC
  Administered 2018-06-07: 11:00:00 46.2 mg via INTRAVENOUS
  Filled 2018-06-07: qty 100

## 2018-06-07 MED ORDER — LABETALOL HCL 5 MG/ML IV SOLN
INTRAVENOUS | Status: AC
  Filled 2018-06-07: qty 4

## 2018-06-07 MED ORDER — NICOTINE 14 MG/24HR TD PT24
14.0000 mg | MEDICATED_PATCH | Freq: Every day | TRANSDERMAL | Status: DC
  Administered 2018-06-07 – 2018-06-10 (×4): 14 mg via TRANSDERMAL
  Filled 2018-06-07 (×4): qty 1

## 2018-06-07 MED ORDER — ACETAMINOPHEN 160 MG/5ML PO SOLN
650.0000 mg | ORAL | Status: DC | PRN
  Filled 2018-06-07: qty 20.3

## 2018-06-07 NOTE — ED Triage Notes (Signed)
Pt is here with her husband states when they woke up around 8am and noticed the pt has slurred speech, right facial droop, "walking like she is drunk". Pt has a hx of stroke in the past.

## 2018-06-07 NOTE — ED Notes (Signed)
Spoke to [place] in ICU. She states that patient's Covid test results must come back before the patient can come to the floor.

## 2018-06-07 NOTE — TOC Initial Note (Signed)
Transition of Care [facility]) - Initial/Assessment Note    Patient Details  Name: PAOLA KARAN MRN: [id number] Date of Birth: 12/21/1955  [facility] CM/SW Contact:    Barrie Dunker, RN Phone Number: 06/07/2018, 1:26 PM  Clinical Narrative:                  This patient is from Home where she lives with her Husband She has a HX of TIAs in the past, last one noted to be in Jan of this year,  at baseline does have a mild facial droop, it worsened this morning and her speech is normally clear but her husband was unable to understand her, she normally walks unassisted and was walking like she was drunk She takes no meds at home at baseline She used to get medication from [facility] however now has Medicare.   need to obtain name of PCP and preferred pharmacy for any new RX  Her husband Minerva Areola provides transportation when she can't     Patient Goals and CMS Choice        Expected Discharge Plan and Services                                                Prior Living Arrangements/Services                       Activities of Daily Living      Permission Sought/Granted                  Emotional Assessment              Admission diagnosis:  Cerebrovascular accident (CVA), unspecified mechanism (HCC) [I63.9] Patient Active Problem List   Diagnosis Date Noted  . Acute cerebral infarction (HCC) 06/07/2018  . Vitamin B12 deficiency 05/06/2016  . Cerebrovascular disease 05/04/2016  . Bipolar 2 disorder, major depressive episode (HCC) 05/02/2016  . Tobacco use disorder 05/02/2016  . Opioid use disorder, mild, abuse (HCC) 05/02/2016  . HTN (hypertension) 05/02/2016   PCP:  Patient, No Pcp Per Pharmacy:   [facility] - [place], [place] - [address] [place] [address] Phone: [phone] Fax: [phone]     Social Determinants of Health (SDOH) Interventions     Readmission Risk Interventions No flowsheet data found.

## 2018-06-07 NOTE — ED Notes (Signed)
CBG 104. MD Thad Ranger and Rolly Pancake aware.

## 2018-06-07 NOTE — Progress Notes (Signed)
Per Dr Belia Heman patient can go without nursing for carotid US and MRI.

## 2018-06-07 NOTE — ED Notes (Signed)
Neurologist at bedside. 

## 2018-06-07 NOTE — Progress Notes (Signed)
Patient did not like her chicken for dinner. Order patient ham sandwich per request.

## 2018-06-07 NOTE — Progress Notes (Signed)
Ch was called with Code Stroke. Pt was being attended by the care team. The husband was called in to the room. Ch referred the case to another ch to provide continued care.    06/07/18 1200  Clinical Encounter Type  Visited With Patient;Family;Health care provider  Visit Type Initial

## 2018-06-07 NOTE — H&P (Signed)
PATIENT NAME: Carolin Coyeddi Son    MR#:  [id number]  DATE OF BIRTH:  06-27-1955  DATE OF ADMISSION:  06/07/2018  PRIMARY CARE PHYSICIAN: Patient, No Pcp Per   REQUESTING/REFERRING PHYSICIAN: Dr. Thana Farreynolds, Leslie  CHIEF COMPLAINT:   Acute facial droop  HISTORY OF PRESENT ILLNESS:  Admitted for acute CVA PMHX of hypertension and hepatitis C   +slurred speech and facial droop  30 mins after waking up around 8 AM  CT head NEGATIVE  US CAROTIDS Color duplex indicates minimal heterogeneous plaque, with no hemodynamically significant stenosis by duplex criteria in the extracranial cerebrovascular circulation.  CODE STROKE CALLED NIHSS scale 4 TPA given   Symptoms resolved Patient admitted to ICU for further care and management Patient wanted to to go home, very upset She does NOT like BP cuff on and she says she is hungry  Patient had to be taken care of against protocols  I advised patient that she needs to stay because she is high risk for death.    PAST MEDICAL HISTORY:   Past Medical History:  Diagnosis Date  . Hepatitis C   . Hypertension     PAST SURGICAL HISTORY:   Past Surgical History:  Procedure Laterality Date  . KNEE ARTHROSCOPY      SOCIAL HISTORY:   Social History   Tobacco Use  . Smoking status: Current Every Day Smoker    Packs/day: 1.00    Types: Cigarettes  . Smokeless tobacco: Never Used  Substance Use Topics  . Alcohol use: Yes    Comment: rarely    FAMILY HISTORY:  No family history on file.  DRUG ALLERGIES:   Allergies  Allergen Reactions  . Tetracycline Hives  . Penicillins Rash  . Tetracyclines & Related Rash    REVIEW OF SYSTEMS:   Review of Systems  Constitutional: Negative for chills and fever.  HENT: Negative for hearing loss and tinnitus.   Eyes: Negative for blurred vision and double vision.  Respiratory: Negative for cough and hemoptysis.   Cardiovascular: Negative for chest pain and palpitations.   Gastrointestinal: Negative for heartburn and nausea.  Genitourinary: Negative for dysuria.  Musculoskeletal: Negative for myalgias.  Skin: Negative for itching and rash.  Neurological: Positive for speech change.       Facial droop.  No focal weakness.  Psychiatric/Behavioral: Negative for depression, hallucinations and suicidal ideas.    MEDICATIONS AT HOME:   Prior to Admission medications   Medication Sig Start Date End Date Taking? Authorizing Provider  aspirin 81 MG tablet Take 1 tablet (81 mg total) by mouth daily. Patient not taking: Reported on 06/07/2018 02/23/18   Nita SickleVeronese, [place], MD  carbamazepine (TEGRETOL) 200 MG tablet Take 1 tablet (200 mg total) by mouth 2 (two) times daily at 8 am and 10 pm. Patient not taking: Reported on 06/07/2018 05/06/16   Pucilowska, Ellin GoodieJolanta B, MD  cyanocobalamin 1000 MCG tablet Take 1 tablet (1,000 mcg total) by mouth daily. Patient not taking: Reported on 06/07/2018 05/06/16   Pucilowska, Braulio ConteJolanta B, MD  hydrochlorothiazide (HYDRODIURIL) 25 MG tablet Take 1 tablet (25 mg total) by mouth daily. Patient not taking: Reported on 06/07/2018 05/07/16   Pucilowska, Braulio ConteJolanta B, MD  OLANZapine (ZYPREXA) 20 MG tablet Take 1 tablet (20 mg total) by mouth at bedtime. Patient not taking: Reported on 06/07/2018 05/06/16   Pucilowska, Braulio ConteJolanta B, MD      VITAL SIGNS:  Blood pressure (!) 144/100, pulse 60, temperature 98.8 F (37.1 C), temperature source Oral, resp. rate 16,  height 5\' 7"  (1.702 m), weight 57.6 kg, SpO2 99 %.  PHYSICAL EXAMINATION:  Physical Exam  Physical Examination:   GENERAL:NAD, no fevers, chills, no weakness no fatigue HEAD: Normocephalic, atraumatic.  EYES: PERLA, EOMI No scleral icterus.  MOUTH: Moist mucosal membrane.  EAR, NOSE, THROAT: Clear without exudates. No external lesions.  NECK: Supple. No thyromegaly.  No JVD.  PULMONARY: CTA B/L no wheezing, rhonchi, crackles CARDIOVASCULAR: S1 and S2. Regular rate and rhythm. No  murmurs GASTROINTESTINAL: Soft, nontender, nondistended. Positive bowel sounds.  MUSCULOSKELETAL: No swelling, clubbing, or edema.  NEUROLOGIC: No gross focal neurological deficits. 5/5 strength all extremities SKIN: No ulceration, lesions, rashes, or cyanosis.  PSYCHIATRIC: Insight, judgment intact. -depression -anxiety ALL OTHER ROS ARE NEGATIVE      LABORATORY PANEL:   CBC Recent Labs  Lab 06/07/18 1006  WBC 8.8  HGB 13.6  HCT 42.2  PLT 308   ------------------------------------------------------------------------------------------------------------------  Chemistries  Recent Labs  Lab 06/07/18 1006  NA 142  K 3.7  CL 105  CO2 28  GLUCOSE 108*  BUN 20  CREATININE 0.89  CALCIUM 8.7*  AST 14*  ALT 14  ALKPHOS 108  BILITOT 0.3   ------------------------------------------------------------------------------------------------------------------  Cardiac Enzymes No results for input(s): TROPONINI in the last 168 hours. ------------------------------------------------------------------------------------------------------------------  RADIOLOGY:  Mr Brain Wo Contrast  Result Date: 06/07/2018 CLINICAL DATA:  Slurred speech.  RIGHT facial droop. EXAM: MRI HEAD WITHOUT CONTRAST MRA HEAD WITHOUT CONTRAST TECHNIQUE: Multiplanar, multiecho pulse sequences of the brain and surrounding structures were obtained without intravenous contrast. Angiographic images of the head were obtained using MRA technique without contrast. COMPARISON:  CT head 06/07/2018 FINDINGS: MRI HEAD FINDINGS Brain: 1 cm area of restricted diffusion, corresponding low ADC, posterior limb internal capsule on the LEFT, adjacent to the posterior lentiform nucleus and lateral thalamus consistent with acute infarction. No hemorrhage, mass lesion, hydrocephalus, or extra-axial fluid. Generalized atrophy. Chronic microvascular ischemic change affects the periventricular and subcortical white matter as well as the  brainstem. Scattered areas of chronic lacunar infarction are seen bilaterally including both thalami and both cerebellar hemispheres. Vascular: Reported separately. Skull and upper cervical spine: Normal marrow signal. Sinuses/Orbits: Negative. Other: None MRA HEAD FINDINGS RIGHT ANTERIOR CIRCULATION: RIGHT ICA demonstrates mild irregularity in its petrous and cavernous segments. No supraclinoid stenosis. Patent proximal anterior and middle cerebral arteries. Moderately diseased inferior M2 MCA bifurcation branch. LEFT A1 anterior cerebral artery dominant contributor to the parasagittal circulation. LEFT ANTERIOR CIRCULATION: Mild narrowing at the junction of the cervical and horizontal petrous segments. 75-90% stenosis of the skull base ICA at the petrous inferior cavernous junction. Additional tandem 75% stenosis mid cavernous segment. Supraclinoid ICA widely patent. Diseased, and functionally occluded, LEFT A1 anterior cerebral artery. LEFT M1 MCA patent but mildly irregular. Irregular but patent M2 MCA bifurcation branches. POSTERIOR CIRCULATION: Basilar artery is dolichoectatic but patent, mildly irregular in its proximal segment. Both vertebrals contribute to its formation, and there is no significant V4 vertebral stenosis. Both posterior cerebral arteries are patent in their proximal segments but there is moderate irregularity of the LEFT P2/P3 junction. Severely diseased BILATERAL superior cerebellar artery segments. Poor visualization of the anterior inferior cerebellar arteries as well as the proximal RIGHT PICA. IMPRESSION: Acute 1 cm infarct, likely LEFT anterior choroidal artery territory, affecting posterior limb internal capsule and lateral thalamus, possible medial lentiform nucleus. This is nonhemorrhagic. Atrophy and small vessel disease. Tandem flow reducing stenoses of the LEFT internal carotid artery in the petrous and cavernous segments. Functionally occluded LEFT  A1 ACA. Widespread  intracranial atherosclerotic disease, including cerebellar branches. See discussion above Electronically Signed   By: Elsie Stain M.D.   On: 06/07/2018 16:08   US Carotid Bilateral (at [facility] And [facility] Only)  Result Date: 06/07/2018 CLINICAL DATA:  63 year old female with stroke EXAM: BILATERAL CAROTID DUPLEX ULTRASOUND TECHNIQUE: Wallace Cullens scale imaging, color Doppler and duplex ultrasound were performed of bilateral carotid and vertebral arteries in the neck. COMPARISON:  No prior duplex FINDINGS: Criteria: Quantification of carotid stenosis is based on velocity parameters that correlate the residual internal carotid diameter with NASCET-based stenosis levels, using the diameter of the distal internal carotid lumen as the denominator for stenosis measurement. The following velocity measurements were obtained: RIGHT ICA:  Systolic 58 cm/sec, Diastolic 16 cm/sec CCA:  51 cm/sec SYSTOLIC ICA/CCA RATIO:  1.1 ECA:  68 cm/sec LEFT ICA:  Systolic 63 cm/sec, Diastolic 161 cm/sec CCA:  46 cm/sec SYSTOLIC ICA/CCA RATIO:  1.4 ECA:  24 cm/sec Right Brachial SBP: Not acquired Left Brachial SBP: Not acquired RIGHT CAROTID ARTERY: No significant calcified disease of the right common carotid artery. Intermediate waveform maintained. Heterogeneous plaque without significant calcifications at the right carotid bifurcation. Low resistance waveform of the right ICA. No significant tortuosity. RIGHT VERTEBRAL ARTERY: Antegrade flow with low resistance waveform. LEFT CAROTID ARTERY: No significant calcified disease of the left common carotid artery. Intermediate waveform maintained. Heterogeneous plaque at the left carotid bifurcation without significant calcifications. Low resistance waveform of the left ICA. LEFT VERTEBRAL ARTERY:  Antegrade flow with low resistance waveform. IMPRESSION: Color duplex indicates minimal heterogeneous plaque, with no hemodynamically significant stenosis by duplex criteria in the extracranial cerebrovascular  circulation. Signed, Yvone Neu. Reyne Dumas, RPVI [facility] Electronically Signed   By: Gilmer Mor D.O.   On: 06/07/2018 14:44   Mr Maxine Glenn Head Wo Contrast  Result Date: 06/07/2018 CLINICAL DATA:  Slurred speech.  RIGHT facial droop. EXAM: MRI HEAD WITHOUT CONTRAST MRA HEAD WITHOUT CONTRAST TECHNIQUE: Multiplanar, multiecho pulse sequences of the brain and surrounding structures were obtained without intravenous contrast. Angiographic images of the head were obtained using MRA technique without contrast. COMPARISON:  CT head 06/07/2018 FINDINGS: MRI HEAD FINDINGS Brain: 1 cm area of restricted diffusion, corresponding low ADC, posterior limb internal capsule on the LEFT, adjacent to the posterior lentiform nucleus and lateral thalamus consistent with acute infarction. No hemorrhage, mass lesion, hydrocephalus, or extra-axial fluid. Generalized atrophy. Chronic microvascular ischemic change affects the periventricular and subcortical white matter as well as the brainstem. Scattered areas of chronic lacunar infarction are seen bilaterally including both thalami and both cerebellar hemispheres. Vascular: Reported separately. Skull and upper cervical spine: Normal marrow signal. Sinuses/Orbits: Negative. Other: None MRA HEAD FINDINGS RIGHT ANTERIOR CIRCULATION: RIGHT ICA demonstrates mild irregularity in its petrous and cavernous segments. No supraclinoid stenosis. Patent proximal anterior and middle cerebral arteries. Moderately diseased inferior M2 MCA bifurcation branch. LEFT A1 anterior cerebral artery dominant contributor to the parasagittal circulation. LEFT ANTERIOR CIRCULATION: Mild narrowing at the junction of the cervical and horizontal petrous segments. 75-90% stenosis of the skull base ICA at the petrous inferior cavernous junction. Additional tandem 75% stenosis mid cavernous segment. Supraclinoid ICA widely patent. Diseased, and functionally  occluded, LEFT A1 anterior cerebral artery. LEFT M1 MCA patent but mildly irregular. Irregular but patent M2 MCA bifurcation branches. POSTERIOR CIRCULATION: Basilar artery is dolichoectatic but patent, mildly irregular in its proximal segment. Both vertebrals contribute to its formation, and there is no significant V4 vertebral stenosis.  Both posterior cerebral arteries are patent in their proximal segments but there is moderate irregularity of the LEFT P2/P3 junction. Severely diseased BILATERAL superior cerebellar artery segments. Poor visualization of the anterior inferior cerebellar arteries as well as the proximal RIGHT PICA. IMPRESSION: Acute 1 cm infarct, likely LEFT anterior choroidal artery territory, affecting posterior limb internal capsule and lateral thalamus, possible medial lentiform nucleus. This is nonhemorrhagic. Atrophy and small vessel disease. Tandem flow reducing stenoses of the LEFT internal carotid artery in the petrous and cavernous segments. Functionally occluded LEFT A1 ACA. Widespread intracranial atherosclerotic disease, including cerebellar branches. See discussion above Electronically Signed   By: Elsie Stain M.D.   On: 06/07/2018 16:08   Ct Head Code Stroke Wo Contrast  Result Date: 06/07/2018 CLINICAL DATA:  Code stroke. Slurred speech and right facial droop upon awakening EXAM: CT HEAD WITHOUT CONTRAST TECHNIQUE: Contiguous axial images were obtained from the base of the skull through the vertex without intravenous contrast. COMPARISON:  02/23/2018 FINDINGS: Brain: No evidence of acute infarction, hemorrhage, hydrocephalus, extra-axial collection or mass lesion/mass effect. Remote bilateral thalamic infarcts. Small remote right cerebellar infarct. Generalized cerebral volume loss greater than expected for age. Vascular: Atherosclerotic calcification.  No hyperdense vessel. Skull: Negative Sinuses/Orbits: No acute finding. Other: These results were called by telephone at the  time of interpretation on 06/07/2018 at 10:18 am to Dr. Jene Every , who verbally acknowledged these results. ASPECTS [facility] Stroke Program Early CT Score) - Ganglionic level infarction (caudate, lentiform nuclei, internal capsule, insula, M1-M3 cortex): 7 - Supraganglionic infarction (M4-M6 cortex): 3 Total score (0-10 with 10 being normal): 10 IMPRESSION: 1. No acute finding. 2. 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 Electronically Signed   By: Marnee Spring M.D.   On: 06/07/2018 10:20      IMPRESSION AND PLAN:   63 yo with acute CVA s/p TPA  Follow up neurology recs Avoid secondary brian injury Follow TPA protocol Continue ICU monitoring  Will treat patient against protocol as she is angry and hungry and wants to go home.     Lucie Leather, M.D.  [facility]  Medical Director [facility] Medical Director [facility] Cardio-Pulmonary Department

## 2018-06-07 NOTE — ED Notes (Signed)
Neurologist spoke with family and pt about plan to administer tpa

## 2018-06-07 NOTE — ED Notes (Signed)
5 unsuccessful attempts at IV access, 2 by this RN, 2 by RN [place], and 1 by charge RN. MD Kinner at bedside with ultrasound and successful places IV

## 2018-06-07 NOTE — Progress Notes (Signed)
Patient took her blood pressure cuff off, "states it hurts". Explained to patient the importance of checking her blood pressure every 30 minutes. She stated "I will go home if I don't get food and you don't take this off of me". Spoke with Dr Belia Heman. Per MD we will check blood pressure every 4 hours. If patient allows we can check more often.

## 2018-06-07 NOTE — Progress Notes (Signed)
Per Dr Belia Heman performed yale screening test. Patient passed, orders per Dr Belia Heman orders for a soft diet.

## 2018-06-07 NOTE — ED Provider Notes (Signed)
[facility] Emergency Department Provider Note   ____________________________________________    I have reviewed the triage vital signs and the nursing notes.   HISTORY  Chief Complaint Weakness     HPI Rachel [name] is a 63 y.o. female who presents with complaints of difficulty speaking, possible facial droop.  Patient reports she does not remember exactly when this happened.  She does think that she was normal when she went to bed last night.  I spoke with husband who reports that when she woke up at 730 this morning she was speaking normally to him but at 8:00 when she came out of the bathroom he could no longer understand her and felt that her facial droop was worse.  She does have a history of "mini strokes in the past"  Past Medical History:  Diagnosis Date  . Hepatitis C   . Hypertension     Patient Active Problem List   Diagnosis Date Noted  . Acute cerebral infarction (HCC) 06/07/2018  . Vitamin B12 deficiency 05/06/2016  . Cerebrovascular disease 05/04/2016  . Bipolar 2 disorder, major depressive episode (HCC) 05/02/2016  . Tobacco use disorder 05/02/2016  . Opioid use disorder, mild, abuse (HCC) 05/02/2016  . HTN (hypertension) 05/02/2016    Past Surgical History:  Procedure Laterality Date  . KNEE ARTHROSCOPY      Prior to Admission medications   Medication Sig Start Date End Date Taking? Authorizing Provider  aspirin 81 MG tablet Take 1 tablet (81 mg total) by mouth daily. Patient not taking: Reported on 06/07/2018 02/23/18   Nita Sickle, MD  carbamazepine (TEGRETOL) 200 MG tablet Take 1 tablet (200 mg total) by mouth 2 (two) times daily at 8 am and 10 pm. Patient not taking: Reported on 06/07/2018 05/06/16   Pucilowska, Ellin Goodie, MD  cyanocobalamin 1000 MCG tablet Take 1 tablet (1,000 mcg total) by mouth daily. Patient not taking: Reported on 06/07/2018 05/06/16   Pucilowska, Braulio Conte B, MD  hydrochlorothiazide (HYDRODIURIL) 25  MG tablet Take 1 tablet (25 mg total) by mouth daily. Patient not taking: Reported on 06/07/2018 05/07/16   Pucilowska, Braulio Conte B, MD  OLANZapine (ZYPREXA) 20 MG tablet Take 1 tablet (20 mg total) by mouth at bedtime. Patient not taking: Reported on 06/07/2018 05/06/16   Shari Prows, MD     Allergies Tetracycline; Penicillins; and Tetracyclines & related  No family history on file.  Social History Social History   Tobacco Use  . Smoking status: Current Every Day Smoker    Packs/day: 1.00    Types: Cigarettes  . Smokeless tobacco: Never Used  Substance Use Topics  . Alcohol use: Yes    Comment: rarely  . Drug use: No    Review of Systems  Constitutional: No fever/chills Eyes: No visual changes.  ENT: No neck pain Cardiovascular: Denies chest pain. Respiratory: No difficulty breathing Gastrointestinal: No nausea, no vomiting.   Genitourinary: Negative for incontinence or dysuria Musculoskeletal: No extremity weakness Skin: Negative for rash. Neurological: As above, no headache   ____________________________________________   PHYSICAL EXAM:  VITAL SIGNS: ED Triage Vitals [06/07/18 0950]  Enc Vitals Group     BP (!) 117/101     Pulse Rate 71     Resp 16     Temp 97.7 F (36.5 C)     Temp Source Oral     SpO2 99 %     Weight 49.9 kg (110 lb)     Height 1.676 m (5'  6")     Head Circumference      Peak Flow      Pain Score 0     Pain Loc      Pain Edu?      Excl. in GC?     Constitutional: Alert  Eyes: Conjunctivae are normal.  PERRLA, EOMI Head: Atraumatic. Nose: No congestion/rhinnorhea. Mouth/Throat: Mucous membranes are moist.   Neck:  Painless ROM Cardiovascular: Normal rate, regular rhythm. Grossly normal heart sounds.  Good peripheral circulation. Respiratory: Normal respiratory effort.  No retractions. Lungs CTAB. Gastrointestinal: Soft and nontender. No distention.  No CVA tenderness. Genitourinary: deferred Musculoskeletal: No lower  extremity tenderness nor edema.  Warm and well perfused.  Normal strength in all extremities Neurologic: Difficult to understand speech.  Right facial droop, forehead spared, tongue deviates slightly to the right.  Otherwise cranial nerves normal. NIHSS  Skin:  Skin is warm, dry and intact. No rash noted. Psychiatric: Mood and affect are normal. Speech and behavior are normal.  ____________________________________________   LABS (all labs ordered are listed, but only abnormal results are displayed)  Labs Reviewed  COMPREHENSIVE METABOLIC PANEL - Abnormal; Notable for the following components:      Result Value   Glucose, Bld 108 (*)    Calcium 8.7 (*)    AST 14 (*)    All other components within normal limits  GLUCOSE, CAPILLARY - Abnormal; Notable for the following components:   Glucose-Capillary 104 (*)    All other components within normal limits  SARS CORONAVIRUS 2 (HOSPITAL ORDER, PERFORMED IN [facility])  ETHANOL  PROTIME-INR  APTT  CBC  DIFFERENTIAL  URINE DRUG SCREEN, QUALITATIVE ([facility] ONLY)  URINALYSIS, ROUTINE W REFLEX MICROSCOPIC  I-STAT CREATININE, ED   ____________________________________________  EKG  ED ECG REPORT I, Jene Everyobert Kirk Basquez, the attending physician, personally viewed and interpreted this ECG.  Date: 06/07/2018  Rhythm: normal sinus rhythm QRS Axis: normal Intervals: normal ST/T Wave abnormalities: normal Narrative Interpretation: no evidence of acute ischemia  ____________________________________________  RADIOLOGY  CT head ____________________________________________   PROCEDURES  Procedure(s) performed: YES  1. Angiocath insertion Performed by: Jene Everyobert Ming Kunka  Consent: Verbal consent obtained. Risks and benefits: risks, benefits and alternatives were discussed Time out: Immediately prior to procedure a "time out" was called to verify the correct patient, procedure, equipment, support staff and site/side marked as  required.  Preparation: Patient was prepped and draped in the usual sterile fashion.  Vein Location: LEFT AC  Ultrasound Guided  Gauge: 20  Normal blood return and flush without difficulty Patient tolerance: Patient tolerated the procedure well with no immediate complications.  2. Angiocath insertion Performed by: Jene Everyobert Laynee Lockamy  Consent: Verbal consent obtained. Risks and benefits: risks, benefits and alternatives were discussed Time out: Immediately prior to procedure a "time out" was called to verify the correct patient, procedure, equipment, support staff and site/side marked as required.  Preparation: Patient was prepped and draped in the usual sterile fashion.  Vein Location: right AC  Ultrasound Guided  Gauge: 18  Normal blood return and flush without difficulty Patient tolerance: Patient tolerated the procedure well with no immediate complications.      Procedures   Critical Care performed: yes  CRITICAL CARE Performed by: Jene Everyobert Chao Blazejewski   Total critical care time: 40minutes  Critical care time was exclusive of separately billable procedures and treating other patients.  Critical care was necessary to treat or prevent imminent or life-threatening deterioration.  Critical care was time spent personally by me  on the following activities: development of treatment plan with patient and/or surrogate as well as nursing, discussions with consultants, evaluation of patient's response to treatment, examination of patient, obtaining history from patient or surrogate, ordering and performing treatments and interventions, ordering and review of laboratory studies, ordering and review of radiographic studies, pulse oximetry and re-evaluation of patient's condition.  ____________________________________________   INITIAL IMPRESSION / ASSESSMENT AND PLAN / ED COURSE  Pertinent labs & imaging results that were available during my care of the patient were reviewed by me  and considered in my medical decision making (see chart for details).  Patient presents with findings concerning for CVA.  Time of onset initially was described as when she woke up however after discussion with husband he feels relatively confident that symptoms began around 8 AM after she had been awake for 30 minutes or so.  This puts her in the window for possible TPA, code stroke called promptly, pending neurology evaluation  CT negative for acute infarct, seen by Dr. Thad Ranger of neurology, she has made decision  to give TPA    ____________________________________________   FINAL CLINICAL IMPRESSION(S) / ED DIAGNOSES  Final diagnoses:  Cerebrovascular accident (CVA), unspecified mechanism (HCC)        Note:  This document was prepared using Dragon voice recognition software and may include unintentional dictation errors.   Jene Every, MD 06/07/18 1224

## 2018-06-07 NOTE — ED Notes (Signed)
Alteplase 48.7 wasted by RN Zollie Scale and RN [place] per stroke calculator on [facility]

## 2018-06-07 NOTE — Progress Notes (Signed)
   06/07/18 1050  Clinical Encounter Type  Visited With Family;Health care provider  Visit Type Code  Referral From Chaplain  Stress Factors  Family Stress Factors Health changes;Lack of knowledge   Code Stroke page received for the patient. Upon arrival, the patient was being assessed by the medical team. Chaplain talked with the patient's husband and learned about what brought her to the hospital as well as some pertinent medical history. The patient's husband shared that they have been married for 39 years and he grieves the loss of the woman he used to know. He shared that he hopes she will stay and allow herself to receive treatment this time. In order to facilitate her accepting treatment, he decided it would be best to leave so that she would not request to leave. Chaplain provided a listening ear and emotional support to the patient's husband during this time of uncertainty.

## 2018-06-07 NOTE — Progress Notes (Signed)
PT Cancellation Note  Patient Details Name: Rachel [name] MRN: [id number] DOB: 02-12-1955   Cancelled Treatment:    Reason Eval/Treat Not Completed: Medical issues which prohibited therapy(Per chart review, patient noted with acute CVA s/p tPA administration (completed approx 1130 5/5).  Per guidelines, to remain on strict bedrest x24 hours and until repeat imaging repeated/cleared.   Will continue to follow and initiate PT eval as medically appropriate.)   Mallorie Norrod H. Manson Passey, PT, DPT, NCS 06/07/18, 3:23 PM [phone]

## 2018-06-07 NOTE — Progress Notes (Signed)
OT Cancellation Note  Patient Details Name: Rachel [name] MRN: [id number] DOB: 30-Nov-1955   Cancelled Treatment:    Reason Eval/Treat Not Completed: Medical issues which prohibited therapy(TPA was administered 06/07/2018 at 11:38. Pt. is on Bedrest for 24 hours from TPA being administered.  WIll attempt inital eval after 24 hour bedrest, clear repeat MRI, and BP within the appropriate parameters.)  Olegario Messier, MS, OTR/L 06/07/2018, 2:26 PM

## 2018-06-07 NOTE — H&P (Signed)
[facility] [place] [facility]   PATIENT NAME: Rachel [name]    MR#:  [id number]  DATE OF BIRTH:  05/17/1955  DATE OF ADMISSION:  06/07/2018  PRIMARY CARE PHYSICIAN: Patient, No Pcp Per   REQUESTING/REFERRING PHYSICIAN: Dr. Thana Farreynolds, Leslie  CHIEF COMPLAINT:   Chief Complaint  Patient presents with  . Weakness  Facial droop and slurred speech  HISTORY OF PRESENT ILLNESS:  Rachel Coyeddi Dayrit  is a 63 y.o. female with a known history of hypertension and hepatitis C who presented to the emergency room this morning with complaints of slurred speech and facial droop.  Patient denied having any focal weakness.  Patient lives with her husband at home.  Work of this morning was at baseline.  At about 8 AM patient started having slurred speech and facial droop and was transferred to the emergency room.   Patient was evaluated immediately by neurologist with NIHSS score of 4.  Imaging studies of the head with CT scan without contrast was negative.  TPA was administered by neurologist and patient subsequently admitted to the ICU for closer monitoring. Neurologist consulted hospitalist service to officially admit patient and she will continue to follow-up with patient for ongoing management.  Intensivist already notified as well.  PAST MEDICAL HISTORY:   Past Medical History:  Diagnosis Date  . Hepatitis C   . Hypertension     PAST SURGICAL HISTORY:   Past Surgical History:  Procedure Laterality Date  . KNEE ARTHROSCOPY      SOCIAL HISTORY:   Social History   Tobacco Use  . Smoking status: Current Every Day Smoker    Packs/day: 1.00    Types: Cigarettes  . Smokeless tobacco: Never Used  Substance Use Topics  . Alcohol use: Yes    Comment: rarely    FAMILY HISTORY:  No family history on file.  DRUG ALLERGIES:   Allergies  Allergen Reactions  . Tetracycline Hives  . Penicillins Rash  . Tetracyclines & Related Rash    REVIEW OF SYSTEMS:   Review of  Systems  Constitutional: Negative for chills and fever.  HENT: Negative for hearing loss and tinnitus.   Eyes: Negative for blurred vision and double vision.  Respiratory: Negative for cough and hemoptysis.   Cardiovascular: Negative for chest pain and palpitations.  Gastrointestinal: Negative for heartburn and nausea.  Genitourinary: Negative for dysuria.  Musculoskeletal: Negative for myalgias.  Skin: Negative for itching and rash.  Neurological: Positive for speech change.       Facial droop.  No focal weakness.  Psychiatric/Behavioral: Negative for depression and hallucinations.    MEDICATIONS AT HOME:   Prior to Admission medications   Medication Sig Start Date End Date Taking? Authorizing Provider  aspirin 81 MG tablet Take 1 tablet (81 mg total) by mouth daily. Patient not taking: Reported on 06/07/2018 02/23/18   Nita SickleVeronese, Boykins, MD  carbamazepine (TEGRETOL) 200 MG tablet Take 1 tablet (200 mg total) by mouth 2 (two) times daily at 8 am and 10 pm. Patient not taking: Reported on 06/07/2018 05/06/16   Pucilowska, Ellin GoodieJolanta B, MD  cyanocobalamin 1000 MCG tablet Take 1 tablet (1,000 mcg total) by mouth daily. Patient not taking: Reported on 06/07/2018 05/06/16   Pucilowska, Braulio ConteJolanta B, MD  hydrochlorothiazide (HYDRODIURIL) 25 MG tablet Take 1 tablet (25 mg total) by mouth daily. Patient not taking: Reported on 06/07/2018 05/07/16   Pucilowska, Braulio ConteJolanta B, MD  OLANZapine (ZYPREXA) 20 MG tablet Take 1 tablet (20 mg total) by  mouth at bedtime. Patient not taking: Reported on 06/07/2018 05/06/16   Pucilowska, Braulio Conte B, MD      VITAL SIGNS:  Blood pressure (!) 186/97, pulse 63, temperature 98.8 F (37.1 C), temperature source Oral, resp. rate (!) 23, height  (1.702 m), weight 57.6 kg, SpO2 99 %.  PHYSICAL EXAMINATION:  Physical Exam  GENERAL:  63 y.o.-year-old patient lying in the bed with no acute distress.  EYES: Pupils equal, round, reactive to light and accommodation. No scleral  icterus. Extraocular muscles intact.  HEENT: Head atraumatic, normocephalic. Oropharynx and nasopharynx clear.  NECK:  Supple, no jugular venous distention. No thyroid enlargement, no tenderness.  LUNGS: Normal breath sounds bilaterally, no wheezing, rales,rhonchi or crepitation. No use of accessory muscles of respiration.  CARDIOVASCULAR: S1, S2 normal. No murmurs, rubs, or gallops.  ABDOMEN: Soft, nontender, nondistended. Bowel sounds present. No organomegaly or mass.  EXTREMITIES: No pedal edema, cyanosis, or clubbing.  NEUROLOGIC: Cranial nerves II through XII are intact. Muscle strength 5/5 in all extremities. Sensation intact. Gait not checked.  Mild slurred speech PSYCHIATRIC: The patient is alert and oriented x 3.  SKIN: No obvious rash, lesion, or ulcer.   LABORATORY PANEL:   CBC Recent Labs  Lab 06/07/18 1006  WBC 8.8  HGB 13.6  HCT 42.2  PLT 308   ------------------------------------------------------------------------------------------------------------------  Chemistries  Recent Labs  Lab 06/07/18 1006  NA 142  K 3.7  CL 105  CO2 28  GLUCOSE 108*  BUN 20  CREATININE 0.89  CALCIUM 8.7*  AST 14*  ALT 14  ALKPHOS 108  BILITOT 0.3   ------------------------------------------------------------------------------------------------------------------  Cardiac Enzymes No results for input(s): TROPONINI in the last 168 hours. ------------------------------------------------------------------------------------------------------------------  RADIOLOGY:  US Carotid Bilateral (at [facility] Only)  Result Date: 06/07/2018 CLINICAL DATA:  63 year old female with stroke EXAM: BILATERAL CAROTID DUPLEX ULTRASOUND TECHNIQUE: Wallace Cullens scale imaging, color Doppler and duplex ultrasound were performed of bilateral carotid and vertebral arteries in the neck. COMPARISON:  No prior duplex FINDINGS: Criteria: Quantification of carotid stenosis is based on velocity parameters that  correlate the residual internal carotid diameter with NASCET-based stenosis levels, using the diameter of the distal internal carotid lumen as the denominator for stenosis measurement. The following velocity measurements were obtained: RIGHT ICA:  Systolic 58 cm/sec, Diastolic 16 cm/sec CCA:  51 cm/sec SYSTOLIC ICA/CCA RATIO:  1.1 ECA:  68 cm/sec LEFT ICA:  Systolic 63 cm/sec, Diastolic 295 cm/sec CCA:  46 cm/sec SYSTOLIC ICA/CCA RATIO:  1.4 ECA:  24 cm/sec Right Brachial SBP: Not acquired Left Brachial SBP: Not acquired RIGHT CAROTID ARTERY: No significant calcified disease of the right common carotid artery. Intermediate waveform maintained. Heterogeneous plaque without significant calcifications at the right carotid bifurcation. Low resistance waveform of the right ICA. No significant tortuosity. RIGHT VERTEBRAL ARTERY: Antegrade flow with low resistance waveform. LEFT CAROTID ARTERY: No significant calcified disease of the left common carotid artery. Intermediate waveform maintained. Heterogeneous plaque at the left carotid bifurcation without significant calcifications. Low resistance waveform of the left ICA. LEFT VERTEBRAL ARTERY:  Antegrade flow with low resistance waveform. IMPRESSION: Color duplex indicates minimal heterogeneous plaque, with no hemodynamically significant stenosis by duplex criteria in the extracranial cerebrovascular circulation. Signed, Yvone Neu. Reyne Dumas, RPVI [facility] Radiology Electronically Signed   By: Gilmer Mor D.O.   On: 06/07/2018 14:44   Ct Head Code Stroke Wo Contrast  Result Date: 06/07/2018 CLINICAL DATA:  Code stroke. Slurred speech and right facial droop upon  awakening EXAM: CT HEAD WITHOUT CONTRAST TECHNIQUE: Contiguous axial images were obtained from the base of the skull through the vertex without intravenous contrast. COMPARISON:  02/23/2018 FINDINGS: Brain: No evidence of acute infarction, hemorrhage,  hydrocephalus, extra-axial collection or mass lesion/mass effect. Remote bilateral thalamic infarcts. Small remote right cerebellar infarct. Generalized cerebral volume loss greater than expected for age. Vascular: Atherosclerotic calcification.  No hyperdense vessel. Skull: Negative Sinuses/Orbits: No acute finding. Other: These results were called by telephone at the time of interpretation on 06/07/2018 at 10:18 am to Dr. Jene Every , who verbally acknowledged these results. ASPECTS [facility] Stroke Program Early CT Score) - Ganglionic level infarction (caudate, lentiform nuclei, internal capsule, insula, M1-M3 cortex): 7 - Supraganglionic infarction (M4-M6 cortex): 3 Total score (0-10 with 10 being normal): 10 IMPRESSION: 1. No acute finding. 2. 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 Electronically Signed   By: Marnee Spring M.D.   On: 06/07/2018 10:20      IMPRESSION AND PLAN:  Patient is a 63 year old with history of hypertension and hep C who was admitted to the ICU following administration of TPA for acute CVA today.  Neurology service already following patient.  1.  Acute CVA Presented with slurred speech and facial droop.  CT head without contrast was negative for any acute findings. Patient status post TPA and subsequently admitted to the ICU.  Being monitored by neurologist. MRI MRA of the brain without contrast already ordered.  Follow-up on 2D echocardiogram and carotid Doppler ultrasound. Physical therapy and speech therapy to evaluate. Patient to start aspirin after 24 hours if repeat imaging shows no evidence of bleeding.  Currently n.p.o. until swallow screen is done.  2.  Hypertension Blood pressure fairly controlled.  Monitor closely while allowing permissive hypertension due to recent acute CVA diagnosed clinically.  3.  Tobacco abuse Smoking cessation counseling done.  Patient requesting for nicotine patch.  4.  Documented history of hep C  Stable.   Follow-up with her gastroenterologist as outpatient   5.  Mild cognitive dysfunction Patient awake and alert times still able to make her own medical decisions according to the husband.  DVT prophylaxis; SCDs.  No heparin products due to patient having had TPA administration today.   All the records are reviewed and case discussed with ED provider. Management plans discussed with the patient, family and they are in agreement. I called and updated patient's husband Mr. Mikyah, Tonthat on treatment plans as outlined above.  He agrees with plan of care.  He also agrees with patient decision to be DNR.  CODE STATUS: DNR/DNI  TOTAL TIME TAKING CARE OF THIS PATIENT: 60 minutes.    Rishav Rockefeller M.D on 06/07/2018 at 3:58 PM  Between 7am to 6pm - Pager - [phone]  After 6pm go to [email or web address] - Social research officer, government  [facility] [place] [facility]  Office  [phone]  CC: Primary care physician; Patient, No Pcp Per   Note: This dictation was prepared with Dragon dictation along with smaller phrase technology. Any transcriptional errors that result from this process are unintentional.

## 2018-06-07 NOTE — ED Notes (Signed)
Altplase started by RN Roselyn Meier

## 2018-06-07 NOTE — Progress Notes (Signed)
Unable to assess patient on arrival to unit, report given to me by charge. Patient went to [place] and MRI per Dr. Belia Heman without nursing.

## 2018-06-07 NOTE — Consult Note (Addendum)
Referring Physician: Cyril Loosen    Chief Complaint: facial droop and difficulty with speech  HPI: Rachel [name] is an 63 y.o. female with a history of tobacco abuse, HTN and stroke, noncompliant with medications, who presents with facial droop and difficulty with speech.  Patient lives at home with husband.  Awakened this morning at baseline.  Was noted at 0800 to have worsening facial droop and difficulty with speech.  Was also off balance with difficulty walking.  Patient has a mild facial droop at baseline but per report of husband this is worse today.  Speech is normal at baseline.   Patient has some memory difficulties at baseline.  Is able to dress herself, feed herself and is not incontinent. Takes no medications at home.   Initial NIHSS of 4.    Date last known well: Date: 06/07/2018 Time last known well: Time: 07:30 tPA Given: Yes  Past Medical History:  Diagnosis Date  . Hepatitis C   . Hypertension     Past Surgical History:  Procedure Laterality Date  . KNEE ARTHROSCOPY      No family history on file.   Social History:  reports that she has been smoking cigarettes. She has been smoking about 1.00 pack per day. She has never used smokeless tobacco. She reports current alcohol use. She reports that she does not use drugs.  Allergies:  Allergies  Allergen Reactions  . Tetracyclines & Related Rash    Medications: None  ROS: History obtained from husband  General ROS: negative for - chills, fatigue, fever, night sweats, weight gain or weight loss Psychological ROS: negative for - behavioral disorder, hallucinations, memory difficulties, mood swings or suicidal ideation Ophthalmic ROS: negative for - blurry vision, double vision, eye pain or loss of vision ENT ROS: negative for - epistaxis, nasal discharge, oral lesions, sore throat, tinnitus or vertigo Allergy and Immunology ROS: negative for - hives or itchy/watery eyes Hematological and Lymphatic ROS: negative for -  bleeding problems, bruising or swollen lymph nodes Endocrine ROS: negative for - galactorrhea, hair pattern changes, polydipsia/polyuria or temperature intolerance Respiratory ROS: negative for - cough, hemoptysis, shortness of breath or wheezing Cardiovascular ROS: negative for - chest pain, dyspnea on exertion, edema or irregular heartbeat Gastrointestinal ROS: negative for - abdominal pain, diarrhea, hematemesis, nausea/vomiting or stool incontinence Genito-Urinary ROS: negative for - dysuria, hematuria, incontinence or urinary frequency/urgency Musculoskeletal ROS: negative for - joint swelling or muscular weakness Neurological ROS: as noted in HPI Dermatological ROS: negative for rash and skin lesion changes  Physical Examination: Blood pressure (!) 156/86, pulse 68, temperature 97.7 F (36.5 C), temperature source Oral, resp. rate 18, height 5\' 6"  (1.676 m), weight 57 kg, SpO2 99 %.  HEENT-  Normocephalic, no lesions, without obvious abnormality.  Normal external eye and conjunctiva.  Normal TM's bilaterally.  Normal auditory canals and external ears. Normal external nose, mucus membranes and septum.  Normal pharynx. Cardiovascular- S1, S2 normal, pulses palpable throughout   Lungs- chest clear, no wheezing, rales, normal symmetric air entry Abdomen- soft, non-tender; bowel sounds normal; no masses,  no organomegaly Extremities- no edema Lymph-no adenopathy palpable Musculoskeletal-no joint tenderness, deformity or swelling Skin-warm and dry, no hyperpigmentation, vitiligo, or suspicious lesions  Neurological Examination   Mental Status: Alert.  Does not know age or month.  Speech fluent but markedly dysarthric.  Able to follow 3 step commands without difficulty. Cranial Nerves: II: Discs flat bilaterally; Visual fields grossly normal, pupils equal, round, reactive to light and accommodation III,IV,  VI: ptosis not present, extra-ocular motions intact bilaterally V,VII: right facial  droop, facial light touch sensation normal bilaterally VIII: hearing normal bilaterally IX,X: gag reflex present XI: bilateral shoulder shrug XII: midline tongue extension Motor: Right : Upper extremity   5/5    Left:     Upper extremity   5/5  Lower extremity   5/5     Lower extremity   5/5 Tone and bulk:normal tone throughout; no atrophy noted Sensory: Pinprick and light touch intact throughout, bilaterally Deep Tendon Reflexes: Symmetric throughout Plantars: Right: downgoing   Left: downgoing Cerebellar: Normal finger-to-nose and normal heel-to-shin test Gait: normal gait and station   Laboratory Studies:  Basic Metabolic Panel: No results for input(s): NA, K, CL, CO2, GLUCOSE, BUN, CREATININE, CALCIUM, MG, PHOS in the last 168 hours.  Liver Function Tests: No results for input(s): AST, ALT, ALKPHOS, BILITOT, PROT, ALBUMIN in the last 168 hours. No results for input(s): LIPASE, AMYLASE in the last 168 hours. No results for input(s): AMMONIA in the last 168 hours.  CBC: Recent Labs  Lab 06/07/18 1006  WBC 8.8  NEUTROABS 6.5  HGB 13.6  HCT 42.2  MCV 87.2  PLT 308    Cardiac Enzymes: No results for input(s): CKTOTAL, CKMB, CKMBINDEX, TROPONINI in the last 168 hours.  BNP: Invalid input(s): POCBNP  CBG: Recent Labs  Lab 06/07/18 1016  GLUCAP 104*    Microbiology: Results for orders placed or performed during the hospital encounter of 04/24/16  Urine culture     Status: Abnormal   Collection Time: 04/24/16  7:22 PM  Result Value Ref Range Status   Specimen Description URINE, RANDOM  Final   Special Requests NONE  Final   Culture (A)  Final    <10,000 COLONIES/mL INSIGNIFICANT GROWTH Performed at [facility], [address] [place], [place] [address]    Report Status 04/26/2016 FINAL  Final  Blood culture (routine x 2)     Status: None   Collection Time: 04/24/16 11:36 PM  Result Value Ref Range Status   Specimen Description BLOOD RIGHT WRIST   Final   Special Requests   Final    BOTTLES DRAWN AEROBIC AND ANAEROBIC Blood Culture adequate volume   Culture NO GROWTH 5 DAYS  Final   Report Status 04/30/2016 FINAL  Final  Blood culture (routine x 2)     Status: None   Collection Time: 04/24/16 11:38 PM  Result Value Ref Range Status   Specimen Description BLOOD LEFT FOREARM  Final   Special Requests   Final    BOTTLES DRAWN AEROBIC AND ANAEROBIC Blood Culture adequate volume   Culture NO GROWTH 5 DAYS  Final   Report Status 04/30/2016 FINAL  Final    Coagulation Studies: No results for input(s): LABPROT, INR in the last 72 hours.  Urinalysis: No results for input(s): COLORURINE, LABSPEC, PHURINE, GLUCOSEU, HGBUR, BILIRUBINUR, KETONESUR, PROTEINUR, UROBILINOGEN, NITRITE, LEUKOCYTESUR in the last 168 hours.  Invalid input(s): APPERANCEUR  Lipid Panel:    Component Value Date/Time   CHOL 210 (H) 05/04/2016 0702   TRIG 89 05/04/2016 0702   HDL 53 05/04/2016 0702   CHOLHDL 4.0 05/04/2016 0702   VLDL 18 05/04/2016 0702   LDLCALC 139 (H) 05/04/2016 0702    HgbA1C:  Lab Results  Component Value Date   HGBA1C 6.0 (H) 05/04/2016    Urine Drug Screen:      Component Value Date/Time   LABOPIA POSITIVE (A) 05/01/2016 1913   COCAINSCRNUR NONE DETECTED  05/01/2016 1913   LABBENZ NONE DETECTED 05/01/2016 1913   AMPHETMU NONE DETECTED 05/01/2016 1913   THCU NONE DETECTED 05/01/2016 1913   LABBARB NONE DETECTED 05/01/2016 1913    Alcohol Level: No results for input(s): ETH in the last 168 hours.  Other results: EKG: sinus rhythm at 62 bpm.  Imaging: Ct Head Code Stroke Wo Contrast  Result Date: 06/07/2018 CLINICAL DATA:  Code stroke. Slurred speech and right facial droop upon awakening EXAM: CT HEAD WITHOUT CONTRAST TECHNIQUE: Contiguous axial images were obtained from the base of the skull through the vertex without intravenous contrast. COMPARISON:  02/23/2018 FINDINGS: Brain: No evidence of acute infarction, hemorrhage,  hydrocephalus, extra-axial collection or mass lesion/mass effect. Remote bilateral thalamic infarcts. Small remote right cerebellar infarct. Generalized cerebral volume loss greater than expected for age. Vascular: Atherosclerotic calcification.  No hyperdense vessel. Skull: Negative Sinuses/Orbits: No acute finding. Other: These results were called by telephone at the time of interpretation on 06/07/2018 at 10:18 am to Dr. Jene EveryOBERT KINNER , who verbally acknowledged these results. ASPECTS [facility]([place] Stroke Program Early CT Score) - Ganglionic level infarction (caudate, lentiform nuclei, internal capsule, insula, M1-M3 cortex): 7 - Supraganglionic infarction (M4-M6 cortex): 3 Total score (0-10 with 10 being normal): 10 IMPRESSION: 1. No acute finding. 2. 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 Electronically Signed   By: Marnee SpringJonathon  Watts M.D.   On: 06/07/2018 10:20    Assessment: 63 y.o. female with a history of tobacco abuse, HTN and stroke, noncompliant with medications, who presents with difficulty with gait, facial droop and difficulty with speech.  Acute infarct suspected of atherosclerotic, small vessel etiology.  Head CT reviewed and shows no acute changes.  Contraindications of tPA reviewed and potential risks/benefits discussed with husband.  Verbal consent obtained for tPA.  Some delay observed due to difficulty obtaining peripheral access prior to administration of tPA without complications.    Stroke Risk Factors - hypertension and smoking  Plan: 1. HgbA1c, fasting lipid panel 2. MRI, MRA  of the brain without contrast.  If performed sooner than 24 hours after tPA patient to have repeat CT in 24 hours after tPA.   3. PT consult, OT consult, Speech consult 4. Echocardiogram 5. Carotid dopplers 6. Prophylactic therapy-None.  Patient to start ASA after 24 hours if repeat imaging shows no evidence of bleeding.   7. NPO until RN stroke swallow screen 8. Telemetry monitoring 9.  Frequent neuro checks 10. Smoking cessation counseling 11. BP control as ordered.   12. Admission to ICU  This patient is critically ill and at significant risk of neurological worsening, death and care requires constant monitoring of vital signs, hemodynamics,respiratory and cardiac monitoring, neurological assessment, discussion with family, other specialists and medical decision making of high complexity. I spent 45 minutes of neurocritical care time  in the care of  this patient.  Thana FarrLeslie Branda Chaudhary, MD Neurology [phone] 06/07/2018, 10:41 AM

## 2018-06-07 NOTE — Progress Notes (Signed)
SLP Cancellation Note  Patient Details Name: KUMARI YASSIN MRN: [id number] DOB: [birth year]   Cancelled treatment:       Reason Eval/Treat Not Completed: Patient at procedure or test/unavailable  SLP will evaluate when patient is available.     Dollene Primrose, MS/CCC- SLP  Leandrew Koyanagi 06/07/2018, 1:58 PM

## 2018-06-07 NOTE — ED Notes (Signed)
ED TO INPATIENT HANDOFF REPORT  ED Nurse Name and Phone #:  Kailey/Ryleigh Buenger [phone]  S Name/Age/Gender Rachel [name] 63 y.o. female Room/Bed: ED19A/ED19A  Code Status   Code Status: Prior  Home/SNF/Other Home Patient oriented to: self Is this baseline? No   Triage Complete: Triage complete  Chief Complaint poss cva  Triage Note Pt is here with her husband states when they woke up around 8am and noticed the pt has slurred speech, right facial droop, "walking like she is drunk". Pt has a hx of stroke in the past.   Allergies Allergies  Allergen Reactions  . Tetracycline Hives  . Penicillins Rash  . Tetracyclines & Related Rash    Level of Care/Admitting Diagnosis ED Disposition    ED Disposition Condition Comment   Admit  Hospital Area: [facility] [[id number]]  Level of Care: ICU [6]  Covid Evaluation: N/A  Diagnosis: Acute cerebral infarction [facility]) [[id number]]  Admitting Physician: Thana Farr [[id number]]  Attending Physician: Thana Farr [[id number]]  Estimated length of stay: past midnight tomorrow  Certification:: I certify this patient will need inpatient services for at least 2 midnights  PT Class (Do Not Modify): Inpatient [101]  PT Acc Code (Do Not Modify): Private [1]       B Medical/Surgery History Past Medical History:  Diagnosis Date  . Hepatitis C   . Hypertension    Past Surgical History:  Procedure Laterality Date  . KNEE ARTHROSCOPY       A IV Location/Drains/Wounds Patient Lines/Drains/Airways Status   Active Line/Drains/Airways    Name:   Placement date:   Placement time:   Site:   Days:   Peripheral IV 06/07/18 Left;Upper Arm   06/07/18    1028    Arm   less than 1   Peripheral IV 06/07/18 Right;Upper Arm   06/07/18    1033    Arm   less than 1          Intake/Output Last 24 hours No intake or output data in the 24 hours ending 06/07/18 1216  Labs/Imaging Results for orders placed or performed during the  hospital encounter of 06/07/18 (from the past 48 hour(s))  Ethanol     Status: None   Collection Time: 06/07/18 10:06 AM  Result Value Ref Range   Alcohol, Ethyl (B) <10 <10 mg/dL    Comment: (NOTE) Lowest detectable limit for serum alcohol is 10 mg/dL. For medical purposes only. Performed at [facility], [address] [place], [place] [address]   Protime-INR     Status: None   Collection Time: 06/07/18 10:06 AM  Result Value Ref Range   Prothrombin Time 12.5 11.4 - 15.2 seconds   INR 0.9 0.8 - 1.2    Comment: (NOTE) INR goal varies based on device and disease states. Performed at [facility], [address] [place], [place] [address]   APTT     Status: None   Collection Time: 06/07/18 10:06 AM  Result Value Ref Range   aPTT 26 24 - 36 seconds    Comment: Performed at [facility], [address] [place], [place] [address]  CBC     Status: None   Collection Time: 06/07/18 10:06 AM  Result Value Ref Range   WBC 8.8 4.0 - 10.5 K/uL   RBC 4.84 3.87 - 5.11 MIL/uL   Hemoglobin 13.6 12.0 - 15.0 g/dL   HCT 30.0 76.2 - 26.3 %   MCV 87.2 80.0 -  100.0 fL   MCH 28.1 26.0 - 34.0 pg   MCHC 32.2 30.0 - 36.0 g/dL   RDW 69.6 29.5 - 28.4 %   Platelets 308 150 - 400 K/uL   nRBC 0.0 0.0 - 0.2 %    Comment: Performed at [facility], [address] [place], [place] [address]  Differential     Status: None   Collection Time: 06/07/18 10:06 AM  Result Value Ref Range   Neutrophils Relative % 73 %   Neutro Abs 6.5 1.7 - 7.7 K/uL   Lymphocytes Relative 17 %   Lymphs Abs 1.5 0.7 - 4.0 K/uL   Monocytes Relative 7 %   Monocytes Absolute 0.6 0.1 - 1.0 K/uL   Eosinophils Relative 2 %   Eosinophils Absolute 0.2 0.0 - 0.5 K/uL   Basophils Relative 1 %   Basophils Absolute 0.0 0.0 - 0.1 K/uL   Immature Granulocytes 0 %   Abs Immature Granulocytes 0.02 0.00 - 0.07 K/uL    Comment: Performed at [facility], [address]  [place], [place] [address]  Comprehensive metabolic panel     Status: Abnormal   Collection Time: 06/07/18 10:06 AM  Result Value Ref Range   Sodium 142 135 - 145 mmol/L   Potassium 3.7 3.5 - 5.1 mmol/L   Chloride 105 98 - 111 mmol/L   CO2 28 22 - 32 mmol/L   Glucose, Bld 108 (H) 70 - 99 mg/dL   BUN 20 8 - 23 mg/dL   Creatinine, Ser 2.53 0.44 - 1.00 mg/dL   Calcium 8.7 (L) 8.9 - 10.3 mg/dL   Total Protein 6.9 6.5 - 8.1 g/dL   Albumin 3.7 3.5 - 5.0 g/dL   AST 14 (L) 15 - 41 U/L   ALT 14 0 - 44 U/L   Alkaline Phosphatase 108 38 - 126 U/L   Total Bilirubin 0.3 0.3 - 1.2 mg/dL   GFR calc non Af Amer >60 >60 mL/min   GFR calc Af Amer >60 >60 mL/min   Anion gap 9 5 - 15    Comment: Performed at [facility], [address] [place], [place] [address]  Glucose, capillary     Status: Abnormal   Collection Time: 06/07/18 10:16 AM  Result Value Ref Range   Glucose-Capillary 104 (H) 70 - 99 mg/dL   Comment 1 Notify RN    Comment 2 Document in Chart    Ct Head Code Stroke Wo Contrast  Result Date: 06/07/2018 CLINICAL DATA:  Code stroke. Slurred speech and right facial droop upon awakening EXAM: CT HEAD WITHOUT CONTRAST TECHNIQUE: Contiguous axial images were obtained from the base of the skull through the vertex without intravenous contrast. COMPARISON:  02/23/2018 FINDINGS: Brain: No evidence of acute infarction, hemorrhage, hydrocephalus, extra-axial collection or mass lesion/mass effect. Remote bilateral thalamic infarcts. Small remote right cerebellar infarct. Generalized cerebral volume loss greater than expected for age. Vascular: Atherosclerotic calcification.  No hyperdense vessel. Skull: Negative Sinuses/Orbits: No acute finding. Other: These results were called by telephone at the time of interpretation on 06/07/2018 at 10:18 am to Dr. Jene Every , who verbally acknowledged these results. ASPECTS [facility] Stroke Program Early CT Score) - Ganglionic level infarction (caudate,  lentiform nuclei, internal capsule, insula, M1-M3 cortex): 7 - Supraganglionic infarction (M4-M6 cortex): 3 Total score (0-10 with 10 being normal): 10 IMPRESSION: 1. No acute finding. 2. 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 Electronically Signed   By: Kathrynn Ducking.D.  On: 06/07/2018 10:20    Pending Labs Unresulted Labs (From admission, onward)    Start     Ordered   06/07/18 1157  SARS Coronavirus 2 (CEPHEID - Performed [facility]), Hosp Order  (Asymptomatic Patients Labs)  Once,   STAT    Question:  Rule Out  Answer:  Yes   06/07/18 1156   06/07/18 1006  Urine Drug Screen, Qualitative  Once,   STAT     06/07/18 1005   06/07/18 1006  Urinalysis, Routine w reflex microscopic  ONCE - STAT,   STAT     06/07/18 1005   Signed and Held  HIV antibody (Routine Testing)  Once,   R     Signed and Held   Signed and Held  Hemoglobin A1c  Tomorrow morning,   R     Signed and Held   Signed and Held  Lipid panel  Tomorrow morning,   R    Comments:  Fasting    Signed and Held          Vitals/Pain Today's Vitals   06/07/18 1130 06/07/18 1145 06/07/18 1200 06/07/18 1205  BP: (!) 162/84 (!) 164/98 (!) 172/86 (!) 168/89  Pulse: (!) 59 61 63 64  Resp: 17 18 16 12   Temp:      TempSrc:      SpO2: 98% 98% 97% 100%  Weight:      Height:      PainSc:        Isolation Precautions No active isolations  Medications Medications  labetalol (NORMODYNE) 5 MG/ML injection (has no administration in time range)  alteplase (ACTIVASE) 1 mg/mL infusion 51.3 mg (0 mg/kg  57 kg Intravenous Stopped 06/07/18 1138)    Followed by  0.9 %  sodium chloride infusion (50 mLs Intravenous New Bag/Given 06/07/18 1133)    Mobility walks Moderate fall risk   Focused Assessments Neuro Assessment Handoff:  Swallow screen pass? Yes  Cardiac Rhythm: Normal sinus rhythm NIH Stroke Scale ( + Modified Stroke Scale Criteria)  Interval: Other (Comment) Level of  Consciousness (1a.)   : Alert, keenly responsive LOC Questions (1b. )   +: Answers neither question correctly LOC Commands (1c. )   + : Performs both tasks correctly Best Gaze (2. )  +: Normal Visual (3. )  +: No visual loss Facial Palsy (4. )    : Normal symmetrical movements Motor Arm, Left (5a. )   +: No drift Motor Arm, Right (5b. )   +: No drift Motor Leg, Left (6a. )   +: No drift Motor Leg, Right (6b. )   +: No drift Limb Ataxia (7. ): Absent Sensory (8. )   +: Normal, no sensory loss Best Language (9. )   +: No aphasia Dysarthria (10. ): Mild-to-moderate dysarthria, patient slurs at least some words and, at worst, can be understood with some difficulty Extinction/Inattention (11.)   +: No Abnormality Modified SS Total  +: 2 Complete NIHSS TOTAL: 3 Last date known well: 06/06/18 Last time known well: 0730 Neuro Assessment:   Neuro Checks:   Initial (06/07/18 1025)  Last Documented NIHSS Modified Score: 2 (06/07/18 1200) Has TPA been given? Yes Temp: 97.9 F (36.6 C) (05/05 1045) Temp Source: Oral (05/05 0950) BP: 168/89 (05/05 1205) Pulse Rate: 64 (05/05 1205) If patient is a Neuro Trauma and patient is going to OR before floor call report to 4N Charge nurse: [phone] or [phone]     R  Recommendations: See Admitting Provider Note  Report given to:   Additional Notes:  TPA stopped at 1138hrs. Patient alert and oriented to name and birthday. Unsure of age, year, month. She thinks that she is 27. Unclear of baseline.

## 2018-06-08 ENCOUNTER — Inpatient Hospital Stay: Payer: Medicare HMO

## 2018-06-08 ENCOUNTER — Inpatient Hospital Stay (HOSPITAL_COMMUNITY)
Admit: 2018-06-08 | Discharge: 2018-06-08 | Disposition: A | Discharge status: Home / Self Care | Payer: Medicare HMO | Attending: Neurology | Admitting: Neurology

## 2018-06-08 DIAGNOSIS — I340 Nonrheumatic mitral (valve) insufficiency: Secondary | ICD-10-CM

## 2018-06-08 DIAGNOSIS — I351 Nonrheumatic aortic (valve) insufficiency: Secondary | ICD-10-CM

## 2018-06-08 LAB — URINALYSIS, ROUTINE W REFLEX MICROSCOPIC
Bilirubin Urine: NEGATIVE
Glucose, UA: NEGATIVE mg/dL
Ketones, ur: NEGATIVE mg/dL
Leukocytes,Ua: NEGATIVE
Nitrite: NEGATIVE
Protein, ur: NEGATIVE mg/dL
Specific Gravity, Urine: 1.003 — ABNORMAL LOW (ref 1.005–1.030)
pH: 6 (ref 5.0–8.0)

## 2018-06-08 LAB — LIPID PANEL
Cholesterol: 236 mg/dL — ABNORMAL HIGH (ref 0–200)
HDL: 55 mg/dL (ref >40)
LDL Cholesterol: 162 mg/dL — ABNORMAL HIGH (ref 0–99)
Total CHOL/HDL Ratio: 4.3 RATIO
Triglycerides: 97 mg/dL (ref <150)
VLDL: 19 mg/dL (ref 0–40)

## 2018-06-08 LAB — BASIC METABOLIC PANEL
Anion gap: 10 (ref 5–15)
BUN: 14 mg/dL (ref 8–23)
CO2: 19 mmol/L — ABNORMAL LOW (ref 22–32)
Calcium: 8.6 mg/dL — ABNORMAL LOW (ref 8.9–10.3)
Chloride: 108 mmol/L (ref 98–111)
Creatinine, Ser: 0.67 mg/dL (ref 0.44–1.00)
GFR calc Af Amer: >60 mL/min (ref >60)
GFR calc non Af Amer: >60 mL/min (ref >60)
Glucose, Bld: 121 mg/dL — ABNORMAL HIGH (ref 70–99)
Potassium: 3.5 mmol/L (ref 3.5–5.1)
Sodium: 137 mmol/L (ref 135–145)

## 2018-06-08 LAB — URINE DRUG SCREEN, QUALITATIVE (ARMC ONLY)
Amphetamines, Ur Screen: NOT DETECTED
Barbiturates, Ur Screen: NOT DETECTED
Benzodiazepine, Ur Scrn: POSITIVE — AB
Cannabinoid 50 Ng, Ur ~~LOC~~: POSITIVE — AB
Cocaine Metabolite,Ur ~~LOC~~: NOT DETECTED
MDMA (Ecstasy)Ur Screen: NOT DETECTED
Methadone Scn, Ur: NOT DETECTED
Opiate, Ur Screen: NOT DETECTED
Phencyclidine (PCP) Ur S: NOT DETECTED
Tricyclic, Ur Screen: NOT DETECTED

## 2018-06-08 LAB — HEMOGLOBIN A1C
Hgb A1c MFr Bld: 5.5 % (ref 4.8–5.6)
Mean Plasma Glucose: 111.15 mg/dL

## 2018-06-08 LAB — CBC
HCT: 43.7 % (ref 36.0–46.0)
Hemoglobin: 14.4 g/dL (ref 12.0–15.0)
MCH: 28 pg (ref 26.0–34.0)
MCHC: 33 g/dL (ref 30.0–36.0)
MCV: 84.9 fL (ref 80.0–100.0)
Platelets: 314 10*3/uL (ref 150–400)
RBC: 5.15 MIL/uL — ABNORMAL HIGH (ref 3.87–5.11)
RDW: 13.4 % (ref 11.5–15.5)
WBC: 9.4 10*3/uL (ref 4.0–10.5)
nRBC: 0 % (ref 0.0–0.2)

## 2018-06-08 LAB — MAGNESIUM: Magnesium: 2.2 mg/dL (ref 1.7–2.4)

## 2018-06-08 LAB — CARBAMAZEPINE LEVEL, TOTAL: Carbamazepine Lvl: <2 ug/mL — ABNORMAL LOW (ref 4.0–12.0)

## 2018-06-08 MED ORDER — ATORVASTATIN CALCIUM 20 MG PO TABS
40.0000 mg | ORAL_TABLET | Freq: Every day | ORAL | Status: DC
  Administered 2018-06-09: 40 mg via ORAL
  Filled 2018-06-08 (×3): qty 2

## 2018-06-08 MED ORDER — OLANZAPINE 10 MG PO TABS
10.0000 mg | ORAL_TABLET | Freq: Two times a day (BID) | ORAL | Status: DC
  Administered 2018-06-08: 10 mg via ORAL
  Filled 2018-06-08: qty 1

## 2018-06-08 MED ORDER — ALPRAZOLAM 0.5 MG PO TABS
0.5000 mg | ORAL_TABLET | Freq: Two times a day (BID) | ORAL | Status: DC | PRN
  Administered 2018-06-08 – 2018-06-09 (×3): 0.5 mg via ORAL
  Filled 2018-06-08 (×4): qty 1

## 2018-06-08 MED ORDER — CARBAMAZEPINE 200 MG PO TABS: 200.0000 mg | ORAL_TABLET | Freq: Two times a day (BID) | ORAL | Status: DC

## 2018-06-08 MED ORDER — CLOPIDOGREL BISULFATE 75 MG PO TABS
75.0000 mg | ORAL_TABLET | Freq: Every day | ORAL | Status: DC
  Administered 2018-06-09 – 2018-06-10 (×2): 75 mg via ORAL
  Filled 2018-06-08 (×2): qty 1

## 2018-06-08 MED ORDER — OLANZAPINE 10 MG PO TABS: 20.0000 mg | ORAL_TABLET | Freq: Every day | ORAL | Status: DC

## 2018-06-08 MED ORDER — LORAZEPAM BOLUS VIA INFUSION: 1.0000 mg | Freq: Four times a day (QID) | INTRAVENOUS | Status: DC | PRN

## 2018-06-08 MED ORDER — LORAZEPAM 2 MG/ML IJ SOLN
INTRAMUSCULAR | Status: AC
  Filled 2018-06-08: qty 1

## 2018-06-08 MED ORDER — ASPIRIN EC 81 MG PO TBEC
81.0000 mg | DELAYED_RELEASE_TABLET | Freq: Every day | ORAL | Status: DC
  Administered 2018-06-08 – 2018-06-10 (×3): 81 mg via ORAL
  Filled 2018-06-08 (×4): qty 1

## 2018-06-08 MED ORDER — PANTOPRAZOLE SODIUM 40 MG PO TBEC
40.0000 mg | DELAYED_RELEASE_TABLET | Freq: Every day | ORAL | Status: DC
  Administered 2018-06-08 – 2018-06-09 (×2): 40 mg via ORAL
  Filled 2018-06-08 (×2): qty 1

## 2018-06-08 MED ORDER — OLANZAPINE 10 MG PO TBDP
20.0000 mg | ORAL_TABLET | Freq: Every day | ORAL | Status: DC
  Administered 2018-06-08: 21:00:00 20 mg via ORAL
  Filled 2018-06-08 (×2): qty 2

## 2018-06-08 MED ORDER — LORAZEPAM 2 MG/ML IJ SOLN
1.0000 mg | Freq: Four times a day (QID) | INTRAMUSCULAR | Status: DC | PRN
  Administered 2018-06-08 – 2018-06-09 (×2): 1 mg via INTRAVENOUS
  Filled 2018-06-08: qty 1

## 2018-06-08 MED ORDER — CARBAMAZEPINE 200 MG PO TABS
200.0000 mg | ORAL_TABLET | Freq: Two times a day (BID) | ORAL | Status: DC
  Administered 2018-06-08: 200 mg via ORAL
  Filled 2018-06-08: qty 1

## 2018-06-08 MED ORDER — HYDRALAZINE HCL 20 MG/ML IJ SOLN
10.0000 mg | INTRAMUSCULAR | Status: DC | PRN
  Administered 2018-06-08 – 2018-06-10 (×3): 10 mg via INTRAVENOUS
  Filled 2018-06-08 (×4): qty 1

## 2018-06-08 NOTE — Progress Notes (Signed)
Pt is very angry that she is not being discharged today. Pt is throwing anything she is able to reach, yelling and attempting to get out of bed. Pt was given an AMA paper, but is unable to sign same for an unknown reason. I have explained to pt that she cannot leave if she doesn't sign out AMA and have a ride to take her home. Pt remains angry, but has agreed to stop throwing things at this time.

## 2018-06-08 NOTE — Progress Notes (Signed)
[employer] - [place] [facility]   PATIENT NAME: Rachel [name]    MR#:  [id number]  DATE OF BIRTH:  1955/06/09  SUBJECTIVE:  CHIEF COMPLAINT:   Chief Complaint  Patient presents with   Weakness   The patient has no complaints.  She still has facial droop on the right side. REVIEW OF SYSTEMS:  Review of Systems  Constitutional: Negative for chills, fever and malaise/fatigue.  HENT: Negative for sore throat.   Eyes: Negative for blurred vision and double vision.  Respiratory: Negative for cough, hemoptysis, shortness of breath, wheezing and stridor.   Cardiovascular: Negative for chest pain, palpitations, orthopnea and leg swelling.  Gastrointestinal: Negative for abdominal pain, blood in stool, diarrhea, melena, nausea and vomiting.  Genitourinary: Negative for dysuria, flank pain and hematuria.  Musculoskeletal: Negative for back pain and joint pain.  Skin: Negative for rash.  Neurological: Negative for dizziness, sensory change, focal weakness, seizures, loss of consciousness, weakness and headaches.  Endo/Heme/Allergies: Negative for polydipsia.  Psychiatric/Behavioral: Negative for depression. The patient is not nervous/anxious.     DRUG ALLERGIES:   Allergies  Allergen Reactions   Tetracycline Hives   Penicillins Rash   Tetracyclines & Related Rash   VITALS:  Blood pressure (!) 173/90, pulse 75, temperature 97.6 F (36.4 C), temperature source Oral, resp. rate 20, height 5\' 7"  (1.702 m), weight 57.6 kg, SpO2 96 %. PHYSICAL EXAMINATION:  Physical Exam Constitutional:      General: She is not in acute distress. HENT:     Head: Normocephalic.     Mouth/Throat:     Mouth: Mucous membranes are moist.  Eyes:     General: No scleral icterus.    Conjunctiva/sclera: Conjunctivae normal.     Pupils: Pupils are equal, round, and reactive to light.  Neck:     Musculoskeletal: Normal range of motion and neck supple.     Vascular: No JVD.   Trachea: No tracheal deviation.  Cardiovascular:     Rate and Rhythm: Normal rate and regular rhythm.     Heart sounds: Normal heart sounds. No murmur. No gallop.   Pulmonary:     Effort: Pulmonary effort is normal. No respiratory distress.     Breath sounds: Normal breath sounds. No wheezing or rales.  Abdominal:     General: Bowel sounds are normal. There is no distension.     Palpations: Abdomen is soft.     Tenderness: There is no abdominal tenderness. There is no rebound.  Musculoskeletal: Normal range of motion.        General: No tenderness.     Right lower leg: No edema.     Left lower leg: No edema.  Skin:    Findings: No erythema or rash.  Neurological:     Mental Status: She is alert and oriented to person, place, and time.     Comments: Facial droop on the right side.  Psychiatric:        Mood and Affect: Mood normal.    LABORATORY PANEL:  Female CBC Recent Labs  Lab 06/08/18 0451  WBC 9.4  HGB 14.4  HCT 43.7  PLT 314   ------------------------------------------------------------------------------------------------------------------ Chemistries  Recent Labs  Lab 06/07/18 1006 06/08/18 0451  NA 142 137  K 3.7 3.5  CL 105 108  CO2 28 19*  GLUCOSE 108* 121*  BUN 20 14  CREATININE 0.89 0.67  CALCIUM 8.7* 8.6*  MG  --  2.2  AST 14*  --   ALT  14  --   ALKPHOS 108  --   BILITOT 0.3  --    RADIOLOGY:  Mr Brain Wo Contrast  Result Date: 06/07/2018 CLINICAL DATA:  Slurred speech.  RIGHT facial droop. EXAM: MRI HEAD WITHOUT CONTRAST MRA HEAD WITHOUT CONTRAST TECHNIQUE: Multiplanar, multiecho pulse sequences of the brain and surrounding structures were obtained without intravenous contrast. Angiographic images of the head were obtained using MRA technique without contrast. COMPARISON:  CT head 06/07/2018 FINDINGS: MRI HEAD FINDINGS Brain: 1 cm area of restricted diffusion, corresponding low ADC, posterior limb internal capsule on the LEFT, adjacent to the  posterior lentiform nucleus and lateral thalamus consistent with acute infarction. No hemorrhage, mass lesion, hydrocephalus, or extra-axial fluid. Generalized atrophy. Chronic microvascular ischemic change affects the periventricular and subcortical white matter as well as the brainstem. Scattered areas of chronic lacunar infarction are seen bilaterally including both thalami and both cerebellar hemispheres. Vascular: Reported separately. Skull and upper cervical spine: Normal marrow signal. Sinuses/Orbits: Negative. Other: None MRA HEAD FINDINGS RIGHT ANTERIOR CIRCULATION: RIGHT ICA demonstrates mild irregularity in its petrous and cavernous segments. No supraclinoid stenosis. Patent proximal anterior and middle cerebral arteries. Moderately diseased inferior M2 MCA bifurcation branch. LEFT A1 anterior cerebral artery dominant contributor to the parasagittal circulation. LEFT ANTERIOR CIRCULATION: Mild narrowing at the junction of the cervical and horizontal petrous segments. 75-90% stenosis of the skull base ICA at the petrous inferior cavernous junction. Additional tandem 75% stenosis mid cavernous segment. Supraclinoid ICA widely patent. Diseased, and functionally occluded, LEFT A1 anterior cerebral artery. LEFT M1 MCA patent but mildly irregular. Irregular but patent M2 MCA bifurcation branches. POSTERIOR CIRCULATION: Basilar artery is dolichoectatic but patent, mildly irregular in its proximal segment. Both vertebrals contribute to its formation, and there is no significant V4 vertebral stenosis. Both posterior cerebral arteries are patent in their proximal segments but there is moderate irregularity of the LEFT P2/P3 junction. Severely diseased BILATERAL superior cerebellar artery segments. Poor visualization of the anterior inferior cerebellar arteries as well as the proximal RIGHT PICA. IMPRESSION: Acute 1 cm infarct, likely LEFT anterior choroidal artery territory, affecting posterior limb internal  capsule and lateral thalamus, possible medial lentiform nucleus. This is nonhemorrhagic. Atrophy and small vessel disease. Tandem flow reducing stenoses of the LEFT internal carotid artery in the petrous and cavernous segments. Functionally occluded LEFT A1 ACA. Widespread intracranial atherosclerotic disease, including cerebellar branches. See discussion above Electronically Signed   By: Elsie Stain M.D.   On: 06/07/2018 16:08   US Carotid Bilateral (at [facility] And [facility] Only)  Result Date: 06/07/2018 CLINICAL DATA:  63 year old female with stroke EXAM: BILATERAL CAROTID DUPLEX ULTRASOUND TECHNIQUE: Wallace Cullens scale imaging, color Doppler and duplex ultrasound were performed of bilateral carotid and vertebral arteries in the neck. COMPARISON:  No prior duplex FINDINGS: Criteria: Quantification of carotid stenosis is based on velocity parameters that correlate the residual internal carotid diameter with NASCET-based stenosis levels, using the diameter of the distal internal carotid lumen as the denominator for stenosis measurement. The following velocity measurements were obtained: RIGHT ICA:  Systolic 58 cm/sec, Diastolic 16 cm/sec CCA:  51 cm/sec SYSTOLIC ICA/CCA RATIO:  1.1 ECA:  68 cm/sec LEFT ICA:  Systolic 63 cm/sec, Diastolic 604 cm/sec CCA:  46 cm/sec SYSTOLIC ICA/CCA RATIO:  1.4 ECA:  24 cm/sec Right Brachial SBP: Not acquired Left Brachial SBP: Not acquired RIGHT CAROTID ARTERY: No significant calcified disease of the right common carotid artery. Intermediate waveform maintained. Heterogeneous plaque without significant calcifications at the right carotid bifurcation. Low  resistance waveform of the right ICA. No significant tortuosity. RIGHT VERTEBRAL ARTERY: Antegrade flow with low resistance waveform. LEFT CAROTID ARTERY: No significant calcified disease of the left common carotid artery. Intermediate waveform maintained. Heterogeneous plaque at the left carotid bifurcation without significant calcifications.  Low resistance waveform of the left ICA. LEFT VERTEBRAL ARTERY:  Antegrade flow with low resistance waveform. IMPRESSION: Color duplex indicates minimal heterogeneous plaque, with no hemodynamically significant stenosis by duplex criteria in the extracranial cerebrovascular circulation. Signed, Yvone NeuJaime S. Reyne DumasWagner, DO, RPVI [facility] Electronically Signed   By: Gilmer MorJaime  Wagner D.O.   On: 06/07/2018 14:44   Mr Maxine GlennMra Head Wo Contrast  Result Date: 06/07/2018 CLINICAL DATA:  Slurred speech.  RIGHT facial droop. EXAM: MRI HEAD WITHOUT CONTRAST MRA HEAD WITHOUT CONTRAST TECHNIQUE: Multiplanar, multiecho pulse sequences of the brain and surrounding structures were obtained without intravenous contrast. Angiographic images of the head were obtained using MRA technique without contrast. COMPARISON:  CT head 06/07/2018 FINDINGS: MRI HEAD FINDINGS Brain: 1 cm area of restricted diffusion, corresponding low ADC, posterior limb internal capsule on the LEFT, adjacent to the posterior lentiform nucleus and lateral thalamus consistent with acute infarction. No hemorrhage, mass lesion, hydrocephalus, or extra-axial fluid. Generalized atrophy. Chronic microvascular ischemic change affects the periventricular and subcortical white matter as well as the brainstem. Scattered areas of chronic lacunar infarction are seen bilaterally including both thalami and both cerebellar hemispheres. Vascular: Reported separately. Skull and upper cervical spine: Normal marrow signal. Sinuses/Orbits: Negative. Other: None MRA HEAD FINDINGS RIGHT ANTERIOR CIRCULATION: RIGHT ICA demonstrates mild irregularity in its petrous and cavernous segments. No supraclinoid stenosis. Patent proximal anterior and middle cerebral arteries. Moderately diseased inferior M2 MCA bifurcation branch. LEFT A1 anterior cerebral artery dominant contributor to the parasagittal circulation. LEFT ANTERIOR CIRCULATION: Mild  narrowing at the junction of the cervical and horizontal petrous segments. 75-90% stenosis of the skull base ICA at the petrous inferior cavernous junction. Additional tandem 75% stenosis mid cavernous segment. Supraclinoid ICA widely patent. Diseased, and functionally occluded, LEFT A1 anterior cerebral artery. LEFT M1 MCA patent but mildly irregular. Irregular but patent M2 MCA bifurcation branches. POSTERIOR CIRCULATION: Basilar artery is dolichoectatic but patent, mildly irregular in its proximal segment. Both vertebrals contribute to its formation, and there is no significant V4 vertebral stenosis. Both posterior cerebral arteries are patent in their proximal segments but there is moderate irregularity of the LEFT P2/P3 junction. Severely diseased BILATERAL superior cerebellar artery segments. Poor visualization of the anterior inferior cerebellar arteries as well as the proximal RIGHT PICA. IMPRESSION: Acute 1 cm infarct, likely LEFT anterior choroidal artery territory, affecting posterior limb internal capsule and lateral thalamus, possible medial lentiform nucleus. This is nonhemorrhagic. Atrophy and small vessel disease. Tandem flow reducing stenoses of the LEFT internal carotid artery in the petrous and cavernous segments. Functionally occluded LEFT A1 ACA. Widespread intracranial atherosclerotic disease, including cerebellar branches. See discussion above Electronically Signed   By: Elsie StainJohn T Curnes M.D.   On: 06/07/2018 16:08   ASSESSMENT AND PLAN:   Patient is a 63 year old with history of hypertension and hep C who was admitted to the ICU following administration of TPA for acute CVA today.  Neurology service already following patient.  1.  Acute CVA Presented with slurred speech and facial droop.  CT head without contrast was negative for any acute findings. Patient status post TPA and subsequently admitted to the ICU.   MRI MRA of the brain without contrast report left 1 cm  infarct affecting  posterior limb internal capsule and lateral thalamus, possible medial lentiform nucleus. 2D echocardiogram is pending and carotid Doppler ultrasound is unremarkable. Physical therapy and speech therapy to evaluate. Patient to start aspirin after 24 hours if repeat imaging shows no evidence of bleeding.  The patient is on soft diet after swallowing study. Repeat CT of the head report CVA but no hemorrhage. Start aspirin if neurologist agrees.  2.  Hypertension Allow permissive hypertension due to acute CVA.  3.  Tobacco abuse Smoking cessation was counseled for 3-4 minutes, on nicotine patch.  4.  Documented history of hep C  Stable.  Follow-up with her gastroenterologist as outpatient   5.  Mild cognitive dysfunction Patient awake and alert times still able to make her own medical decisions according to the husband.  Hyperlipidemia.  LDL 162, start Lipitor.  DVT prophylaxis; SCDs.  No heparin products due to patient having had TPA administration yesterday.  All the records are reviewed and case discussed with Care Management/Social Worker. Management plans discussed with the patient, her husband and they are in agreement.  CODE STATUS: DNR  TOTAL TIME TAKING CARE OF THIS PATIENT: 33 minutes.   More than 50% of the time was spent in counseling/coordination of care: YES  POSSIBLE D/C IN 2 DAYS, DEPENDING ON CLINICAL CONDITION.   Shaune Pollack M.D on 06/08/2018 at 12:13 PM  Between 7am to 6pm - Pager - [phone]  After 6pm go to [email or web address] - Therapist, nutritional Hospitalists

## 2018-06-08 NOTE — Progress Notes (Signed)
PT Cancellation Note  Patient Details Name: [name] MRN: [id number] DOB: 04-Sep-1955   Cancelled Treatment:    Reason Eval/Treat Not Completed: Other (comment).  PT consult received.  Chart reviewed. Staff reporting pt walked around nursing loop earlier this afternoon but was unsteady requiring assist (for balance).  Agitation noted in chart and also noted pt with recent Ativan and Xanax medication.  Per discussion with pt's nurse (regarding above), will hold PT at this time (pt resting s/p above medication).  Will re-attempt PT evaluation tomorrow.  Hendricks Limes, PT 06/08/18, 3:12 PM [phone]

## 2018-06-08 NOTE — Evaluation (Signed)
Occupational Therapy Evaluation Patient Details Name: Rachel [name] MRN: [id number] DOB: [birth year] Today's Date: 06/08/2018    History of Present Illness Rachel [name]  is a 62 y.o. female with a known history of hypertension and hepatitis C who presented to the emergency the morning of 5/5 with complaints of slurred speech and facial droop. 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 This is nonhemorrhagic. Atrophy and small vessel disease.    Clinical Impression   Rachel [name] was seen for OT evaluation on this date. Upon arrival to room, Rachel [name] was attempting to get out of bed yelling that she needed to use the bathroom. OT assisted pt with using bed pan at EOB. Rachel [name] remained agitated t/o OT assessment. She answered "No" or "I want to go home" to most questions asked of her. She did indicate that she lives at home with her husband who drives and assists with IADLs. Rachel [name] stated she was independent in all areas of ADL prior to this admission and is eager leave the hospital. Rachel [name]' dominant RUE was notably weaker than her LUE, however, she appears unaware of this decrease in functional use of her dominant hand. Treatment and education limited on this date due to pt agitation. Rachel [name] would benefit from skilled occupational therapy services in order to address her functional limitations (see below for further details) and promote return to PLOF. Upon hospital DC recommend STR.      Follow Up Recommendations  SNF    Equipment Recommendations  (TBD)    Recommendations for Other Services       Precautions / Restrictions Precautions Precautions: Fall Restrictions Weight Bearing Restrictions: No      Mobility Bed Mobility Overal bed mobility: Needs Assistance Bed Mobility: Supine to Sit;Sit to Supine     Supine to sit: Min  guard Sit to supine: Min assist      Transfers Overall transfer level: Needs assistance Equipment used: 1 person hand held assist Transfers: Sit to/from Stand Sit to Stand: Min assist;Mod assist         General transfer comment: Pt movements appear erratic and unsteady at times. PT noted to reuire increased time/effort to complete bed mobility. Upon arrival to room, pt was attempting to get out of bed to get to her room bathroom despite IV and bed rest orders.     Balance Overall balance assessment: Needs assistance Sitting-balance support: Feet supported;Single extremity supported Sitting balance-Leahy Scale: Fair   Postural control: Posterior lean Standing balance support: Bilateral upper extremity supported;During functional activity Standing balance-Leahy Scale: Poor                             ADL either performed or assessed with clinical judgement   ADL Overall ADL's : Needs assistance/impaired                                       General ADL Comments: Pt demonstrated significant right sided weakness/function loss. However ADL assessment was limited at time of eval due to pt agitation. Pt endorses she can complete all ADL tasks independently, however skilled observation of her RUE use indicates she is having significant difficulty with self feeding and tasks that require [facility] of the RUE.  Vision Baseline Vision/History: Wears glasses Wears Glasses: At all times Patient Visual Report: No change from baseline Additional Comments: PT answered "No" to all questions asked about her vision. Vision should continue to be monitored during functional tasks to asses for any visual field loss or neglect.      Perception     Praxis      Pertinent Vitals/Pain Pain Assessment: No/denies pain     Hand Dominance Right   Extremity/Trunk Assessment Upper Extremity Assessment Upper Extremity Assessment: RUE deficits/detail RUE Deficits /  Details: RUE grossly 3+/5. Pt able to raise above midline, but tends to fall with or without challenge. Pt appears to have little awareness of functional deficits. [facility] also significantly decreased as compared to non dominant side. Unable to fully assess due to pt agitation.  RUE Coordination: decreased fine motor;decreased gross motor   Lower Extremity Assessment Lower Extremity Assessment: Defer to PT evaluation       Communication     Cognition Arousal/Alertness: Awake/alert Behavior During Therapy: Agitated;Restless;Impulsive Overall Cognitive Status: History of cognitive impairments - at baseline                                     General Comments  Pt very agitated t/o session. Told this OT multiple times "I want out of here". OT attempted to utilize therapeutic use of self and active listening to re-assure pt, but she remained agitated and yelled frequently.     Exercises Other Exercises Other Exercises: Pt assisted with bed level toileting on this date. She required 1 person hand held assist to use bedpan at EOB and consistent VC's for safety.   Shoulder Instructions      Home Living Family/patient expects to be discharged to:: Private residence Living Arrangements: Spouse/significant other                               Additional Comments: History very limited at time of evaluation. Pt tended to answer "no" or "I want to go home" to all questions asked.       Prior Functioning/Environment          Comments: Pt endorses she was independent in all areas of ADL may be poor historian.         OT Problem List: Decreased strength;Impaired balance (sitting and/or standing);Decreased cognition;Decreased range of motion;Decreased safety awareness;Decreased activity tolerance;Decreased knowledge of use of DME or AE;Impaired UE functional use      OT Treatment/Interventions: Self-care/ADL training    OT Goals(Current goals can be found in the  care plan section) Acute Rehab OT Goals Patient Stated Goal: To go home OT Goal Formulation: With patient Time For Goal Achievement: 06/22/18 Potential to Achieve Goals: Fair ADL Goals Pt Will Perform Eating: with set-up;with supervision(With LRAD for safety and improved functional independence.) Pt Will Perform Grooming: with set-up;with supervision;with adaptive equipment(With LRAD for safety and improved functional independence.) Pt Will Perform Upper Body Dressing: with set-up;with supervision(With LRAD for safety and improved functional independence.) Pt Will Perform Lower Body Dressing: with min guard assist;with set-up;with adaptive equipment;sit to/from stand(With LRAD for safety and improved functional independence.)  OT Frequency: Min 2X/week   Barriers to D/C:            Co-evaluation              AM-PAC OT "6 Clicks" Daily Activity  Outcome Measure Help from another person eating meals?: A Lot Help from another person taking care of personal grooming?: A Lot Help from another person toileting, which includes using toliet, bedpan, or urinal?: A Lot Help from another person bathing (including washing, rinsing, drying)?: A Lot Help from another person to put on and taking off regular upper body clothing?: A Little Help from another person to put on and taking off regular lower body clothing?: A Little 6 Click Score: 14   End of Session Equipment Utilized During Treatment: Gait belt Nurse Communication: Other (comment)(Call bell in room not working. )  Activity Tolerance: Treatment limited secondary to agitation Patient left: in bed;with call bell/phone within reach;with bed alarm set  OT Visit Diagnosis: Other abnormalities of gait and mobility (R26.89);Hemiplegia and hemiparesis Hemiplegia - Right/Left: Right Hemiplegia - dominant/non-dominant: Dominant Hemiplegia - caused by: Cerebral infarction                Time: [id number] OT Time Calculation (min): 24  min Charges:  OT General Charges $OT Visit: 1 Visit OT Evaluation $OT Eval Moderate Complexity: 1 Mod OT Treatments $Self Care/Home Management : 8-22 mins  Rockney GheeSerenity Donia Yokum, M.S., OTR/L Ascom: [phone] 06/08/18, 2:21 PM

## 2018-06-08 NOTE — Progress Notes (Signed)
Subjective: Patient agitated but no new neurological complaints.    Objective: Current vital signs: BP (!) 173/90 (BP Location: Left Arm)   Pulse 75   Temp 97.6 F (36.4 C) (Oral)   Resp 20   Ht  (1.702 m)   Wt 57.6 kg   SpO2 96%   BMI 19.89 kg/m  Vital signs in last 24 hours: Temp:  [97.6 F (36.4 C)-98.8 F (37.1 C)] 97.6 F (36.4 C) (05/06 1159) Pulse Rate:  [55-83] 75 (05/06 1159) Resp:  [6-29] 20 (05/06 1159) BP: (129-186)/(82-105) 173/90 (05/06 1159) SpO2:  [95 %-100 %] 96 % (05/06 1159) FiO2 (%):  [21 %] 21 % (05/05 1308) Weight:  [57.6 kg] 57.6 kg (05/05 1314)  Intake/Output from previous day: 05/05 0701 - 05/06 0700 In: 640.2 [I.V.:640.2] Out: 1850 [Urine:1850] Intake/Output this shift: Total I/O In: 177.6 [I.V.:177.6] Out: 300 [Urine:300] Nutritional status:  Diet Order            DIET SOFT Room service appropriate? Yes; Fluid consistency: Thin  Diet effective now              Neurologic Exam: Mental Status: Alert.  Agitated.  Speech fluent but markedly dysarthric.  Able to follow 3 step commands without difficulty. Cranial Nerves: II: Discs flat bilaterally; Visual fields grossly normal, pupils equal, round, reactive to light and accommodation III,IV, VI: ptosis not present, extra-ocular motions intact bilaterally V,VII: right facial droop, facial light touch sensation normal bilaterally VIII: hearing normal bilaterally IX,X: gag reflex present XI: bilateral shoulder shrug XII: midline tongue extension Motor: 5/5 throughout Sensory: Pinprick and light touch intact throughout, bilaterally  Lab Results: Basic Metabolic Panel: Recent Labs  Lab 06/07/18 1006 06/08/18 0451  NA 142 137  K 3.7 3.5  CL 105 108  CO2 28 19*  GLUCOSE 108* 121*  BUN 20 14  CREATININE 0.89 0.67  CALCIUM 8.7* 8.6*  MG  --  2.2    Liver Function Tests: Recent Labs  Lab 06/07/18 1006  AST 14*  ALT 14  ALKPHOS 108  BILITOT 0.3  PROT 6.9  ALBUMIN 3.7    No results for input(s): LIPASE, AMYLASE in the last 168 hours. No results for input(s): AMMONIA in the last 168 hours.  CBC: Recent Labs  Lab 06/07/18 1006 06/08/18 0451  WBC 8.8 9.4  NEUTROABS 6.5  --   HGB 13.6 14.4  HCT 42.2 43.7  MCV 87.2 84.9  PLT 308 314    Cardiac Enzymes: No results for input(s): CKTOTAL, CKMB, CKMBINDEX, TROPONINI in the last 168 hours.  Lipid Panel: Recent Labs  Lab 06/08/18 0451  CHOL 236*  TRIG 97  HDL 55  CHOLHDL 4.3  VLDL 19  LDLCALC 098*    CBG: Recent Labs  Lab 06/07/18 1016 06/07/18 1310  GLUCAP 104* 116*    Microbiology: Results for orders placed or performed during the hospital encounter of 06/07/18  SARS Coronavirus 2 (CEPHEID - Performed in [facility] hospital lab), Hosp Order     Status: None   Collection Time: 06/07/18 11:57 AM  Result Value Ref Range Status   SARS Coronavirus 2 NEGATIVE NEGATIVE Final    Comment: (NOTE) If result is NEGATIVE SARS-CoV-2 target nucleic acids are NOT DETECTED. The SARS-CoV-2 RNA is generally detectable in upper and lower  respiratory specimens during the acute phase of infection. The lowest  concentration of SARS-CoV-2 viral copies this assay can detect is 250  copies / mL. A negative result does not preclude SARS-CoV-2  infection  and should not be used as the sole basis for treatment or other  patient management decisions.  A negative result may occur with  improper specimen collection / handling, submission of specimen other  than nasopharyngeal swab, presence of viral mutation(s) within the  areas targeted by this assay, and inadequate number of viral copies  (<250 copies / mL). A negative result must be combined with clinical  observations, patient history, and epidemiological information. If result is POSITIVE SARS-CoV-2 target nucleic acids are DETECTED. The SARS-CoV-2 RNA is generally detectable in upper and lower  respiratory specimens dur ing the acute phase of  infection.  Positive  results are indicative of active infection with SARS-CoV-2.  Clinical  correlation with patient history and other diagnostic information is  necessary to determine patient infection status.  Positive results do  not rule out bacterial infection or co-infection with other viruses. If result is PRESUMPTIVE POSTIVE SARS-CoV-2 nucleic acids MAY BE PRESENT.   A presumptive positive result was obtained on the submitted specimen  and confirmed on repeat testing.  While 2019 novel coronavirus  (SARS-CoV-2) nucleic acids may be present in the submitted sample  additional confirmatory testing may be necessary for epidemiological  and / or clinical management purposes  to differentiate between  SARS-CoV-2 and other Sarbecovirus currently known to infect humans.  If clinically indicated additional testing with an alternate test  methodology [phone]) is advised. The SARS-CoV-2 RNA is generally  detectable in upper and lower respiratory sp ecimens during the acute  phase of infection. The expected result is Negative. Fact Sheet for Patients:  BoilerBrush.com.cy Fact Sheet for Healthcare Providers: https://pope.com/ This test is not yet approved or cleared by the [place] FDA and has been authorized for detection and/or diagnosis of SARS-CoV-2 by FDA under an Emergency Use Authorization (EUA).  This EUA will remain in effect (meaning this test can be used) for the duration of the COVID-19 declaration under Section 564(b)(1) of the Act, 21 U.S.C. section 360bbb-3(b)(1), unless the authorization is terminated or revoked sooner. Performed at [facility], [address] [place], [place] [address]   MRSA PCR Screening     Status: None   Collection Time: 06/07/18  1:16 PM  Result Value Ref Range Status   MRSA by PCR NEGATIVE NEGATIVE Final    Comment:        The GeneXpert MRSA Assay (FDA approved for NASAL  specimens only), is one component of a comprehensive MRSA colonization surveillance program. It is not intended to diagnose MRSA infection nor to guide or monitor treatment for MRSA infections. Performed at [facility], [address] [place], [place] [address]     Coagulation Studies: Recent Labs    06/07/18 1006  LABPROT 12.5  INR 0.9    Imaging: Ct Head Wo Contrast  Result Date: 06/08/2018 CLINICAL DATA:  Stroke follow-up. EXAM: CT HEAD WITHOUT CONTRAST TECHNIQUE: Contiguous axial images were obtained from the base of the skull through the vertex without intravenous contrast. COMPARISON:  MR brain and CT head from yesterday. FINDINGS: Brain: Small vague area of hypodensity in the region of the left posterior limb of the internal capsule, corresponding to the acute infarct seen on MRI. No evidence of hemorrhage, hydrocephalus, extra-axial collection or mass lesion/mass effect. Old bilateral thalamic and right cerebellar lacunar infarcts again noted. Unchanged mild atrophy. Vascular: Atherosclerotic vascular calcification of the carotid siphons. No hyperdense vessel. Skull: Normal. Negative for fracture or focal lesion. Sinuses/Orbits: No acute finding. Other:  None. IMPRESSION: 1. Small vague area of hypodensity in the region of the left posterior limb of the internal capsule, corresponding to the acute infarct seen on MRI. No hemorrhage. Electronically Signed   By: Obie Dredge M.D.   On: 06/08/2018 12:13   Mr Brain Wo Contrast  Result Date: 06/07/2018 CLINICAL DATA:  Slurred speech.  RIGHT facial droop. EXAM: MRI HEAD WITHOUT CONTRAST MRA HEAD WITHOUT CONTRAST TECHNIQUE: Multiplanar, multiecho pulse sequences of the brain and surrounding structures were obtained without intravenous contrast. Angiographic images of the head were obtained using MRA technique without contrast. COMPARISON:  CT head 06/07/2018 FINDINGS: MRI HEAD FINDINGS Brain: 1 cm area of restricted diffusion,  corresponding low ADC, posterior limb internal capsule on the LEFT, adjacent to the posterior lentiform nucleus and lateral thalamus consistent with acute infarction. No hemorrhage, mass lesion, hydrocephalus, or extra-axial fluid. Generalized atrophy. Chronic microvascular ischemic change affects the periventricular and subcortical white matter as well as the brainstem. Scattered areas of chronic lacunar infarction are seen bilaterally including both thalami and both cerebellar hemispheres. Vascular: Reported separately. Skull and upper cervical spine: Normal marrow signal. Sinuses/Orbits: Negative. Other: None MRA HEAD FINDINGS RIGHT ANTERIOR CIRCULATION: RIGHT ICA demonstrates mild irregularity in its petrous and cavernous segments. No supraclinoid stenosis. Patent proximal anterior and middle cerebral arteries. Moderately diseased inferior M2 MCA bifurcation branch. LEFT A1 anterior cerebral artery dominant contributor to the parasagittal circulation. LEFT ANTERIOR CIRCULATION: Mild narrowing at the junction of the cervical and horizontal petrous segments. 75-90% stenosis of the skull base ICA at the petrous inferior cavernous junction. Additional tandem 75% stenosis mid cavernous segment. Supraclinoid ICA widely patent. Diseased, and functionally occluded, LEFT A1 anterior cerebral artery. LEFT M1 MCA patent but mildly irregular. Irregular but patent M2 MCA bifurcation branches. POSTERIOR CIRCULATION: Basilar artery is dolichoectatic but patent, mildly irregular in its proximal segment. Both vertebrals contribute to its formation, and there is no significant V4 vertebral stenosis. Both posterior cerebral arteries are patent in their proximal segments but there is moderate irregularity of the LEFT P2/P3 junction. Severely diseased BILATERAL superior cerebellar artery segments. Poor visualization of the anterior inferior cerebellar arteries as well as the proximal RIGHT PICA. 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 This is nonhemorrhagic. Atrophy and small vessel disease. Tandem flow reducing stenoses of the LEFT internal carotid artery in the petrous and cavernous segments. Functionally occluded LEFT A1 ACA. Widespread intracranial atherosclerotic disease, including cerebellar branches. See discussion above Electronically Signed   By: Elsie Stain M.D.   On: 06/07/2018 16:08   US Carotid Bilateral (at [facility] Only)  Result Date: 06/07/2018 CLINICAL DATA:  63 year old female with stroke EXAM: BILATERAL CAROTID DUPLEX ULTRASOUND TECHNIQUE: Wallace Cullens scale imaging, color Doppler and duplex ultrasound were performed of bilateral carotid and vertebral arteries in the neck. COMPARISON:  No prior duplex FINDINGS: Criteria: Quantification of carotid stenosis is based on velocity parameters that correlate the residual internal carotid diameter with NASCET-based stenosis levels, using the diameter of the distal internal carotid lumen as the denominator for stenosis measurement. The following velocity measurements were obtained: RIGHT ICA:  Systolic 58 cm/sec, Diastolic 16 cm/sec CCA:  51 cm/sec SYSTOLIC ICA/CCA RATIO:  1.1 ECA:  68 cm/sec LEFT ICA:  Systolic 63 cm/sec, Diastolic 160 cm/sec CCA:  46 cm/sec SYSTOLIC ICA/CCA RATIO:  1.4 ECA:  24 cm/sec Right Brachial SBP: Not acquired Left Brachial SBP: Not acquired RIGHT  CAROTID ARTERY: No significant calcified disease of the right common carotid artery. Intermediate waveform maintained. Heterogeneous plaque without significant calcifications at the right carotid bifurcation. Low resistance waveform of the right ICA. No significant tortuosity. RIGHT VERTEBRAL ARTERY: Antegrade flow with low resistance waveform. LEFT CAROTID ARTERY: No significant calcified disease of the left common carotid artery. Intermediate waveform maintained.  Heterogeneous plaque at the left carotid bifurcation without significant calcifications. Low resistance waveform of the left ICA. LEFT VERTEBRAL ARTERY:  Antegrade flow with low resistance waveform. IMPRESSION: Color duplex indicates minimal heterogeneous plaque, with no hemodynamically significant stenosis by duplex criteria in the extracranial cerebrovascular circulation. Signed, Yvone NeuJaime S. Reyne DumasWagner, DO, RPVI [facility] Electronically Signed   By: Gilmer MorJaime  Wagner D.O.   On: 06/07/2018 14:44   Mr Maxine GlennMra Head Wo Contrast  Result Date: 06/07/2018 CLINICAL DATA:  Slurred speech.  RIGHT facial droop. EXAM: MRI HEAD WITHOUT CONTRAST MRA HEAD WITHOUT CONTRAST TECHNIQUE: Multiplanar, multiecho pulse sequences of the brain and surrounding structures were obtained without intravenous contrast. Angiographic images of the head were obtained using MRA technique without contrast. COMPARISON:  CT head 06/07/2018 FINDINGS: MRI HEAD FINDINGS Brain: 1 cm area of restricted diffusion, corresponding low ADC, posterior limb internal capsule on the LEFT, adjacent to the posterior lentiform nucleus and lateral thalamus consistent with acute infarction. No hemorrhage, mass lesion, hydrocephalus, or extra-axial fluid. Generalized atrophy. Chronic microvascular ischemic change affects the periventricular and subcortical white matter as well as the brainstem. Scattered areas of chronic lacunar infarction are seen bilaterally including both thalami and both cerebellar hemispheres. Vascular: Reported separately. Skull and upper cervical spine: Normal marrow signal. Sinuses/Orbits: Negative. Other: None MRA HEAD FINDINGS RIGHT ANTERIOR CIRCULATION: RIGHT ICA demonstrates mild irregularity in its petrous and cavernous segments. No supraclinoid stenosis. Patent proximal anterior and middle cerebral arteries. Moderately diseased inferior M2 MCA bifurcation branch. LEFT A1 anterior cerebral  artery dominant contributor to the parasagittal circulation. LEFT ANTERIOR CIRCULATION: Mild narrowing at the junction of the cervical and horizontal petrous segments. 75-90% stenosis of the skull base ICA at the petrous inferior cavernous junction. Additional tandem 75% stenosis mid cavernous segment. Supraclinoid ICA widely patent. Diseased, and functionally occluded, LEFT A1 anterior cerebral artery. LEFT M1 MCA patent but mildly irregular. Irregular but patent M2 MCA bifurcation branches. POSTERIOR CIRCULATION: Basilar artery is dolichoectatic but patent, mildly irregular in its proximal segment. Both vertebrals contribute to its formation, and there is no significant V4 vertebral stenosis. Both posterior cerebral arteries are patent in their proximal segments but there is moderate irregularity of the LEFT P2/P3 junction. Severely diseased BILATERAL superior cerebellar artery segments. Poor visualization of the anterior inferior cerebellar arteries as well as the proximal RIGHT PICA. IMPRESSION: Acute 1 cm infarct, likely LEFT anterior choroidal artery territory, affecting posterior limb internal capsule and lateral thalamus, possible medial lentiform nucleus. This is nonhemorrhagic. Atrophy and small vessel disease. Tandem flow reducing stenoses of the LEFT internal carotid artery in the petrous and cavernous segments. Functionally occluded LEFT A1 ACA. Widespread intracranial atherosclerotic disease, including cerebellar branches. See discussion above Electronically Signed   By: Elsie StainJohn T Curnes M.D.   On: 06/07/2018 16:08   Ct Head Code Stroke Wo Contrast  Result Date: 06/07/2018 CLINICAL DATA:  Code stroke. Slurred speech and right facial droop upon awakening EXAM: CT HEAD WITHOUT CONTRAST TECHNIQUE: Contiguous axial images were obtained from the base of the skull through the vertex without intravenous contrast. COMPARISON:  02/23/2018 FINDINGS: Brain: No evidence of acute infarction,  hemorrhage,  hydrocephalus, extra-axial collection or mass lesion/mass effect. Remote bilateral thalamic infarcts. Small remote right cerebellar infarct. Generalized cerebral volume loss greater than expected for age. Vascular: Atherosclerotic calcification.  No hyperdense vessel. Skull: Negative Sinuses/Orbits: No acute finding. Other: These results were called by telephone at the time of interpretation on 06/07/2018 at 10:18 am to Dr. Jene Every , who verbally acknowledged these results. ASPECTS [facility] Stroke Program Early CT Score) - Ganglionic level infarction (caudate, lentiform nuclei, internal capsule, insula, M1-M3 cortex): 7 - Supraganglionic infarction (M4-M6 cortex): 3 Total score (0-10 with 10 being normal): 10 IMPRESSION: 1. No acute finding. 2. 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 Electronically Signed   By: Marnee Spring M.D.   On: 06/07/2018 10:20    Medications:  I have reviewed the patient's current medications. Scheduled: .  stroke: mapping our early stages of recovery book   Does not apply Once  . atorvastatin  40 mg Oral q1800  . nicotine  14 mg Transdermal Daily  . pantoprazole  40 mg Oral QHS    Assessment/Plan: Patient with a stable neurological examination s/p tPA.  Agitated and wants to go home.  MRI of the brain reviewed and shows a left IC infarct.  Repeat head CT reviewed and shows no evidence of hemorrhage.  Carotid dopplers show no evidence of hemodynamically significant stenosis.  Echocardiogram pending.  A1c 5.5, LDL 162.  Recommendations: 1. Agree with statin.  Goal LDL<70 2. Would start ASA  today.  On 5/7 would start ASA  and Plavix  daily to continue for one month before return to monotherapy with ASA.   3. BP control   LOS: 1 day   Thana Farr, MD Neurology [phone] 06/08/2018  12:36 PM

## 2018-06-08 NOTE — Consult Note (Signed)
[facility] Face-to-Face Psychiatry Consult   Reason for Consult: Hallucinations Referring Physician: Dr. Imogene Burn Patient Identification: Rachel [name] MRN:  [id number] Principal Diagnosis: Acute cerebral infarction [facility]) Diagnosis:  Principal Problem:   Acute cerebral infarction [facility]) Active Problems:   Bipolar 2 disorder, major depressive episode (HCC)   CVA (cerebral vascular accident) [facility])  Patient seen, chart is reviewed, reviewed past history from discharge summary inpatient psychiatry, collateral obtained from patient's husband. Total Time spent with patient: 1 hour  Subjective: "I want a cigarette, and would like to go home."  HPI: Rachel [name] is a 63 y.o. female patient with a known history of hypertension and hepatitis C who presented to the emergency room this morning with complaints of slurred speech and facial droop.  Patient denied having any focal weakness.  Patient lives with her husband at home.  Work of this morning was at baseline.  At about 8 AM patient started having slurred speech and facial droop and was transferred to the emergency room.   Patient was evaluated immediately by neurologist with NIHSS score of 4.  Imaging studies of the head with CT scan without contrast was negative.  TPA was administered by neurologist and patient subsequently admitted to the ICU for closer monitoring. Neurologist consulted hospitalist service to officially admit patient and she will continue to follow-up with patient for ongoing management.    Psychiatry consult is requested for concerns of hallucinations in patient with known psychiatric history.  Nursing staff does not report patient has been having hallucinations, but is upset that she has to stay in the hospital.  She has not had any behavioral issues although she is agitated and is requesting discharge.  Urine drug screen has not been collected.  There is a concern that patient's husband wants her to stay in the hospital.  Past  Psychiatric History: Bipolar 2 disorder and substance abuse  Risk to Self:  Denies Risk to Others:  Denies Prior Inpatient Therapy:  Yes, last inpatient psychiatric admission at [facility] April 2018 with a substance-induced psychotic disorder. Prior Outpatient Therapy:  Patient does not follow with outpatient psychiatrist.  She states she has not continued medication after discharge from years ago.  On evaluation, patient is calm and cooperative.  She is aware that she has had a stroke.  She is requesting to leave the hospital desiring to smoke a cigarette.  She reports that the nicotine patch and gum have been ineffective reducing her cravings.  Patient is denying any suicidal or homicidal ideation, plan or intent.  Denying any auditory or visual hallucinations, and does not appear to be responding to any internal stimuli.   Collateral obtained from husband, Rovena Hearld: Per husband, patient has been having worsening dementia over the past 3 years. She has been off of her psychiatric medicine for years which has caused her to have increased anxiety and OCD. Husband reports that he has been giving her Benadryl at night. He would like for patient to be able to come home with physical therapy.   Past Medical History:  Past Medical History:  Diagnosis Date  . Hepatitis C   . Hypertension     Past Surgical History:  Procedure Laterality Date  . KNEE ARTHROSCOPY     Family History: No family history on file.   Family Psychiatric  History: Her father and her cousin committed suicide.  Social History:  Social History   Substance and Sexual Activity  Alcohol Use Yes   Comment: rarely  Social History   Substance and Sexual Activity  Drug Use No    Social History   Socioeconomic History  . Marital status: Married    Spouse name: Not on file  . Number of children: Not on file  . Years of education: Not on file  . Highest education level: Not on file  Occupational History  . Not  on file  Social Needs  . Financial resource strain: Not on file  . Food insecurity:    Worry: Not on file    Inability: Not on file  . Transportation needs:    Medical: Not on file    Non-medical: Not on file  Tobacco Use  . Smoking status: Current Every Day Smoker    Packs/day: 1.00    Types: Cigarettes  . Smokeless tobacco: Never Used  Substance and Sexual Activity  . Alcohol use: Yes    Comment: rarely  . Drug use: No  . Sexual activity: Not on file  Lifestyle  . Physical activity:    Days per week: Not on file    Minutes per session: Not on file  . Stress: Not on file  Relationships  . Social connections:    Talks on phone: Not on file    Gets together: Not on file    Attends religious service: Not on file    Active member of club or organization: Not on file    Attends meetings of clubs or organizations: Not on file    Relationship status: Not on file  Other Topics Concern  . Not on file  Social History Narrative  . Not on file   Additional Social History:  Lives with husband.  Allergies:   Allergies  Allergen Reactions  . Tetracycline Hives  . Penicillins Rash  . Tetracyclines & Related Rash    Labs:  Results for orders placed or performed during the hospital encounter of 06/07/18 (from the past 48 hour(s))  Ethanol     Status: None   Collection Time: 06/07/18 10:06 AM  Result Value Ref Range   Alcohol, Ethyl (B) <10 <10 mg/dL    Comment: (NOTE) Lowest detectable limit for serum alcohol is 10 mg/dL. For medical purposes only. Performed at [facility], [address] [place], [place] [address]   Protime-INR     Status: None   Collection Time: 06/07/18 10:06 AM  Result Value Ref Range   Prothrombin Time 12.5 11.4 - 15.2 seconds   INR 0.9 0.8 - 1.2    Comment: (NOTE) INR goal varies based on device and disease states. Performed at [facility], [address] [place], [place] [address]   APTT     Status: None    Collection Time: 06/07/18 10:06 AM  Result Value Ref Range   aPTT 26 24 - 36 seconds    Comment: Performed at [facility], [address] [place], [place] [address]  CBC     Status: None   Collection Time: 06/07/18 10:06 AM  Result Value Ref Range   WBC 8.8 4.0 - 10.5 K/uL   RBC 4.84 3.87 - 5.11 MIL/uL   Hemoglobin 13.6 12.0 - 15.0 g/dL   HCT 91.4 78.2 - 95.6 %   MCV 87.2 80.0 - 100.0 fL   MCH 28.1 26.0 - 34.0 pg   MCHC 32.2 30.0 - 36.0 g/dL   RDW 21.3 08.6 - 57.8 %   Platelets 308 150 - 400 K/uL   nRBC 0.0 0.0 - 0.2 %  Comment: Performed at [facility], [address] [place], [place] [address]  Differential     Status: None   Collection Time: 06/07/18 10:06 AM  Result Value Ref Range   Neutrophils Relative % 73 %   Neutro Abs 6.5 1.7 - 7.7 K/uL   Lymphocytes Relative 17 %   Lymphs Abs 1.5 0.7 - 4.0 K/uL   Monocytes Relative 7 %   Monocytes Absolute 0.6 0.1 - 1.0 K/uL   Eosinophils Relative 2 %   Eosinophils Absolute 0.2 0.0 - 0.5 K/uL   Basophils Relative 1 %   Basophils Absolute 0.0 0.0 - 0.1 K/uL   Immature Granulocytes 0 %   Abs Immature Granulocytes 0.02 0.00 - 0.07 K/uL    Comment: Performed at [facility], [address] [place], [place] [address]  Comprehensive metabolic panel     Status: Abnormal   Collection Time: 06/07/18 10:06 AM  Result Value Ref Range   Sodium 142 135 - 145 mmol/L   Potassium 3.7 3.5 - 5.1 mmol/L   Chloride 105 98 - 111 mmol/L   CO2 28 22 - 32 mmol/L   Glucose, Bld 108 (H) 70 - 99 mg/dL   BUN 20 8 - 23 mg/dL   Creatinine, Ser 0.98 0.44 - 1.00 mg/dL   Calcium 8.7 (L) 8.9 - 10.3 mg/dL   Total Protein 6.9 6.5 - 8.1 g/dL   Albumin 3.7 3.5 - 5.0 g/dL   AST 14 (L) 15 - 41 U/L   ALT 14 0 - 44 U/L   Alkaline Phosphatase 108 38 - 126 U/L   Total Bilirubin 0.3 0.3 - 1.2 mg/dL   GFR calc non Af Amer >60 >60 mL/min   GFR calc Af Amer >60 >60 mL/min   Anion gap 9 5 - 15    Comment: Performed at [facility], [address] [place], [place] [address]  Glucose, capillary     Status: Abnormal   Collection Time: 06/07/18 10:16 AM  Result Value Ref Range   Glucose-Capillary 104 (H) 70 - 99 mg/dL   Comment 1 Notify RN    Comment 2 Document in Chart   SARS Coronavirus 2 (CEPHEID - Performed in [facility] hospital lab), Hosp Order     Status: None   Collection Time: 06/07/18 11:57 AM  Result Value Ref Range   SARS Coronavirus 2 NEGATIVE NEGATIVE    Comment: (NOTE) If result is NEGATIVE SARS-CoV-2 target nucleic acids are NOT DETECTED. The SARS-CoV-2 RNA is generally detectable in upper and lower  respiratory specimens during the acute phase of infection. The lowest  concentration of SARS-CoV-2 viral copies this assay can detect is 250  copies / mL. A negative result does not preclude SARS-CoV-2 infection  and should not be used as the sole basis for treatment or other  patient management decisions.  A negative result may occur with  improper specimen collection / handling, submission of specimen other  than nasopharyngeal swab, presence of viral mutation(s) within the  areas targeted by this assay, and inadequate number of viral copies  (<250 copies / mL). A negative result must be combined with clinical  observations, patient history, and epidemiological information. If result is POSITIVE SARS-CoV-2 target nucleic acids are DETECTED. The SARS-CoV-2 RNA is generally detectable in upper and lower  respiratory specimens dur ing the acute phase of infection.  Positive  results are indicative of active infection with SARS-CoV-2.  Clinical  correlation with patient history and other diagnostic  information is  necessary to determine patient infection status.  Positive results do  not rule out bacterial infection or co-infection with other viruses. If result is PRESUMPTIVE POSTIVE SARS-CoV-2 nucleic acids MAY BE PRESENT.   A presumptive positive result was obtained on the submitted  specimen  and confirmed on repeat testing.  While 2019 novel coronavirus  (SARS-CoV-2) nucleic acids may be present in the submitted sample  additional confirmatory testing may be necessary for epidemiological  and / or clinical management purposes  to differentiate between  SARS-CoV-2 and other Sarbecovirus currently known to infect humans.  If clinically indicated additional testing with an alternate test  methodology [phone]) is advised. The SARS-CoV-2 RNA is generally  detectable in upper and lower respiratory sp ecimens during the acute  phase of infection. The expected result is Negative. Fact Sheet for Patients:  BoilerBrush.com.cy Fact Sheet for Healthcare Providers: https://pope.com/ This test is not yet approved or cleared by the [place] FDA and has been authorized for detection and/or diagnosis of SARS-CoV-2 by FDA under an Emergency Use Authorization (EUA).  This EUA will remain in effect (meaning this test can be used) for the duration of the COVID-19 declaration under Section 564(b)(1) of the Act, 21 U.S.C. section 360bbb-3(b)(1), unless the authorization is terminated or revoked sooner. Performed at [facility], [address] [place], [place] [address]   Glucose, capillary     Status: Abnormal   Collection Time: 06/07/18  1:10 PM  Result Value Ref Range   Glucose-Capillary 116 (H) 70 - 99 mg/dL  MRSA PCR Screening     Status: None   Collection Time: 06/07/18  1:16 PM  Result Value Ref Range   MRSA by PCR NEGATIVE NEGATIVE    Comment:        The GeneXpert MRSA Assay (FDA approved for NASAL specimens only), is one component of a comprehensive MRSA colonization surveillance program. It is not intended to diagnose MRSA infection nor to guide or monitor treatment for MRSA infections. Performed at [facility], [address] [place], [place] [address]   Hemoglobin A1c     Status: None    Collection Time: 06/08/18  4:51 AM  Result Value Ref Range   Hgb A1c MFr Bld 5.5 4.8 - 5.6 %    Comment: (NOTE) Pre diabetes:          5.7%-6.4% Diabetes:              >6.4% Glycemic control for   <7.0% adults with diabetes    Mean Plasma Glucose 111.15 mg/dL    Comment: Performed at [facility], [address], [place], [place] [address]  Lipid panel     Status: Abnormal   Collection Time: 06/08/18  4:51 AM  Result Value Ref Range   Cholesterol 236 (H) 0 - 200 mg/dL   Triglycerides 97 <562 mg/dL   HDL 55 >13 mg/dL   Total CHOL/HDL Ratio 4.3 RATIO   VLDL 19 0 - 40 mg/dL   LDL Cholesterol 086 (H) 0 - 99 mg/dL    Comment:        Total Cholesterol/HDL:CHD Risk Coronary Heart Disease Risk Table                     Men   Women  1/2 Average Risk   3.4   3.3  Average Risk       5.0   4.4  2 X Average Risk   9.6  7.1  3 X Average Risk  23.4   11.0        Use the calculated Patient Ratio above and the CHD Risk Table to determine the patient's CHD Risk.        ATP III CLASSIFICATION (LDL):  <100     mg/dL   Optimal  893-734  mg/dL   Near or Above                    Optimal  130-159  mg/dL   Borderline  287-681  mg/dL   High  >157     mg/dL   Very High Performed at [facility], [address] [place], [place] [address]   Basic metabolic panel     Status: Abnormal   Collection Time: 06/08/18  4:51 AM  Result Value Ref Range   Sodium 137 135 - 145 mmol/L   Potassium 3.5 3.5 - 5.1 mmol/L   Chloride 108 98 - 111 mmol/L   CO2 19 (L) 22 - 32 mmol/L   Glucose, Bld 121 (H) 70 - 99 mg/dL   BUN 14 8 - 23 mg/dL   Creatinine, Ser 5.59 0.44 - 1.00 mg/dL   Calcium 8.6 (L) 8.9 - 10.3 mg/dL   GFR calc non Af Amer >60 >60 mL/min   GFR calc Af Amer >60 >60 mL/min   Anion gap 10 5 - 15    Comment: Performed at [facility], [address] [place], [place] [address]  CBC     Status: Abnormal   Collection Time: 06/08/18  4:51 AM  Result Value Ref Range    WBC 9.4 4.0 - 10.5 K/uL   RBC 5.15 (H) 3.87 - 5.11 MIL/uL   Hemoglobin 14.4 12.0 - 15.0 g/dL   HCT 84.5 36.4 - 68.0 %   MCV 84.9 80.0 - 100.0 fL   MCH 28.0 26.0 - 34.0 pg   MCHC 33.0 30.0 - 36.0 g/dL   RDW 32.1 22.4 - 82.5 %   Platelets 314 150 - 400 K/uL   nRBC 0.0 0.0 - 0.2 %    Comment: Performed at [facility], [address], [place], [place] [address]  Magnesium     Status: None   Collection Time: 06/08/18  4:51 AM  Result Value Ref Range   Magnesium 2.2 1.7 - 2.4 mg/dL    Comment: Performed at [facility], [address] [place], [place] [address]    Current Facility-Administered Medications  Medication Dose Route Frequency Provider Last Rate Last Dose  .  stroke: mapping our early stages of recovery book   Does not apply Once Thana Farr, MD      . acetaminophen (TYLENOL) tablet 650 mg  650 mg Oral Q4H PRN Thana Farr, MD       Or  . acetaminophen (TYLENOL) solution 650 mg  650 mg Per Tube Q4H PRN Thana Farr, MD       Or  . acetaminophen (TYLENOL) suppository 650 mg  650 mg Rectal Q4H PRN Thana Farr, MD      . ALPRAZolam Prudy Feeler) tablet 0.5 mg  0.5 mg Oral BID PRN Harlon Ditty D, NP   0.5 mg at 06/08/18 1440  . aspirin EC tablet 81 mg  81 mg Oral Daily Thana Farr, MD   81 mg at 06/08/18 1440  . atorvastatin (LIPITOR) tablet 40 mg  40 mg Oral q1800 Shaune Pollack, MD      . Melene Muller ON 06/09/2018] clopidogrel (  PLAVIX) tablet 75 mg  75 mg Oral Daily Thana Farreynolds, Leslie, MD      . hydrALAZINE (APRESOLINE) injection 10 mg  10 mg Intravenous Q4H PRN Harlon DittyKeene, Jeremiah D, NP   10 mg at 06/08/18 0417  . LORazepam (ATIVAN) 2 MG/ML injection           . LORazepam (ATIVAN) injection 1 mg  1 mg Intravenous Q6H PRN Shaune Pollackhen, Qing, MD   1 mg at 06/08/18 1433  . nicotine (NICODERM CQ - dosed in mg/24 hours) patch 14 mg  14 mg Transdermal Daily Ojie, Jude, MD   14 mg at 06/08/18 1158  . nicotine polacrilex (NICORETTE) gum 2 mg  2 mg Oral PRN Judithe ModestKeene,  Jeremiah D, NP      . pantoprazole (PROTONIX) EC tablet 40 mg  40 mg Oral QHS Bertram SavinSimpson, Michael L, RPH      . senna-docusate (Senokot-S) tablet 1 tablet  1 tablet Oral QHS PRN Thana Farreynolds, Leslie, MD        Musculoskeletal: Strength & Muscle Tone: decreased Gait & Station: unsteady Patient leans: N/A  Psychiatric Specialty Exam: Physical Exam  Nursing note and vitals reviewed. Constitutional: She appears well-developed and well-nourished. No distress.  HENT:  Head: Normocephalic and atraumatic.  Eyes: EOM are normal.  Neck: Normal range of motion.  Cardiovascular: Normal rate and regular rhythm.  Respiratory: Effort normal.  Neurological: She is alert.    Review of Systems  Psychiatric/Behavioral: Positive for memory loss. Negative for depression, hallucinations, substance abuse and suicidal ideas. The patient has insomnia. The patient is not nervous/anxious.   All other systems reviewed and are negative.   Blood pressure (!) 173/90, pulse 75, temperature 97.6 F (36.4 C), temperature source Oral, resp. rate 20, height 5\' 7"  (1.702 m), weight 57.6 kg, SpO2 96 %.Body mass index is 19.89 kg/m.  General Appearance: Casual  Eye Contact:  Good  Speech:  Clear and Coherent  Volume:  Normal  Mood:  Dysphoric and Irritable  Affect:  Congruent  Thought Process:  Goal Directed  Orientation:  Full (Time, Place, and Person)  Thought Content:  Hallucinations: None  Suicidal Thoughts:  No  Homicidal Thoughts:  No  Memory:  fair  Judgement:  Fair  Insight:  Fair  Psychomotor Activity:  Normal  Concentration:  Attention Span: Good  Recall:  Good  Fund of Knowledge:  Good  Language:  Good  Akathisia:  No  Handed:  Right  AIMS (if indicated):     Assets:  ArchitectCommunication Skills Financial Resources/Insurance Housing Resilience Social Support  ADL's:  Intact  Cognition:  Impaired,  Mild  Sleep:   decreased     Treatment Plan Summary: Daily contact with patient to assess and  evaluate symptoms and progress in treatment, Medication management and one dose zyprexa 10 mg and tegretol 200 mg. Start Zyprexa Zydis 20 mg at QHS  Disposition: No evidence of imminent risk to self or others at present.   Patient does not meet criteria for psychiatric inpatient admission. Supportive therapy provided about ongoing stressors. Discussed crisis plan, support from social network, calling 911, coming to the Emergency Department, and calling Suicide Hotline.  Mariel CraftSHEILA M , MD 06/08/2018 3:08 PM

## 2018-06-08 NOTE — Progress Notes (Signed)
PHARMACIST - PHYSICIAN COMMUNICATION  CONCERNING: IV to Oral Route Change Policy  RECOMMENDATION: This patient is receiving pantoprazole by the intravenous route.  Based on criteria approved by the Pharmacy and Therapeutics Committee, the intravenous medication(s) is/are being converted to the equivalent oral dose form(s).   DESCRIPTION: These criteria include:  The patient is eating (either orally or via tube) and/or has been taking other orally administered medications for a least 24 hours  The patient has no evidence of active gastrointestinal bleeding or impaired GI absorption (gastrectomy, short bowel, patient on TNA or NPO).  If you have questions about this conversion, please contact the Pharmacy Department  [x]   [phone] )  [facility]    Simpson,Michael L, [facility] 06/08/2018 7:16 AM

## 2018-06-08 NOTE — Progress Notes (Signed)
*  PRELIMINARY RESULTS* Echocardiogram 2D Echocardiogram has been performed.  Rachel [name] 06/08/2018, 10:45 AM

## 2018-06-08 NOTE — Evaluation (Signed)
Clinical/Bedside Swallow Evaluation Patient Details  Name: Rachel [name] MRN: [id number] Date of Birth: August 08, 1955  Today's Date: 06/08/2018 Time: SLP Start Time (ACUTE ONLY): 0900 SLP Stop Time (ACUTE ONLY): 0945 SLP Time Calculation (min) (ACUTE ONLY): 45 min  Past Medical History:  Past Medical History:  Diagnosis Date  . Hepatitis C   . Hypertension    Past Surgical History:  Past Surgical History:  Procedure Laterality Date  . KNEE ARTHROSCOPY     HPI:  H&P 06/07/2018: Rachel [name]  is a 63 y.o. female with a known history of hypertension and hepatitis C who presented to the emergency room this morning with complaints of slurred speech and facial droop.  Patient denied having any focal weakness.  Patient lives with her husband at home.  Work of this morning was at baseline.  At about 8 AM patient started having slurred speech and facial droop and was transferred to the emergency room.   Patient was evaluated immediately by neurologist with NIHSS score of 4.  Imaging studies of the head with CT scan without contrast was negative.  TPA was administered by neurologist and patient subsequently admitted to the ICU for closer monitoring.  Neurologist consulted hospitalist service to officially admit patient and she will continue to follow-up with patient for ongoing management.  Intensivist already notified as well.   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 This is nonhemorrhagic. Atrophy and small vessel disease. Tandem flow reducing stenoses of the LEFT internal carotid artery in the petrous and cavernous segments. Functionally occluded LEFT A1 ACA. Widespread intracranial atherosclerotic disease, including cerebellar branches.   Neurology consult 06/07/2018: Rachel [name] is an 63 y.o. female with a history of tobacco abuse, HTN and stroke, noncompliant  with medications, who presents with facial droop and difficulty with speech.  Patient lives at home with husband.  Awakened this morning at baseline.  Was noted at 0800 to have worsening facial droop and difficulty with speech.  Was also off balance with difficulty walking.  Patient has a mild facial droop at baseline but per report of husband this is worse today.  Speech is normal at baseline.  Patient has some memory difficulties at baseline.  Is able to dress herself, feed herself and is not incontinent. Takes no medications at home.  Initial NIHSS of 4.     Assessment / Plan / Recommendation Clinical Impression  The patient is presenting with mild dysarthria of speech characterized by hypophonia and imprecise articulation secondary acute infarct.  Per family, the patient has baseline memory difficulties and mild facial droop with "normal" speech.  Language appears to be intact.  Patient able to describe complex picture with no overt word finding difficulty, paraphasias, or grammatical errors.  She is able to follow complex multi-step commands delivered at a normal rate.  The patient's speech is intelligible when cued to speak louder and slower.  The patient does not appear to be aware of changes in her speech.  SLP will follow for education regarding speech intelligibility strategies.  The patient may benefit from continued speech therapy after discharge per her wishes.   SLP Visit Diagnosis: Dysarthria and anarthria (R47.1)    Aspiration Risk       Diet Recommendation          Other  Recommendations     Follow up Recommendations Other (comment)(TBD)      Frequency and  Duration min 1 x/week          Prognosis        Swallow Study   General HPI: H&P 06/07/2018: Rachel [name]  is a 63 y.o. female with a known history of hypertension and hepatitis C who presented to the emergency room this morning with complaints of slurred speech and facial droop.  Patient denied having any focal  weakness.  Patient lives with her husband at home.  Work of this morning was at baseline.  At about 8 AM patient started having slurred speech and facial droop and was transferred to the emergency room.   Patient was evaluated immediately by neurologist with NIHSS score of 4.  Imaging studies of the head with CT scan without contrast was negative.  TPA was administered by neurologist and patient subsequently admitted to the ICU for closer monitoring.  Neurologist consulted hospitalist service to officially admit patient and she will continue to follow-up with patient for ongoing management.  Intensivist already notified as well. 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 This is nonhemorrhagic. Atrophy and small vessel disease. Tandem flow reducing stenoses of the LEFT internal carotid artery in the petrous and cavernous segments. Functionally occluded LEFT A1 ACA. Widespread intracranial atherosclerotic disease, including cerebellar branches. Neurology consult 06/07/2018: Rachel [name] is an 63 y.o. female with a history of tobacco abuse, HTN and stroke, noncompliant with medications, who presents with facial droop and difficulty with speech.  Patient lives at home with husband.  Awakened this morning at baseline.  Was noted at 0800 to have worsening facial droop and difficulty with speech.  Was also off balance with difficulty walking.  Patient has a mild facial droop at baseline but per report of husband this is worse today.  Speech is normal at baseline.  Patient has some memory difficulties at baseline.  Is able to dress herself, feed herself and is not incontinent. Takes no medications at home.  Initial NIHSS of 4.      Oral/Motor/Sensory Function Overall Oral Motor/Sensory Function: Mild impairment Facial ROM: Reduced right Facial Symmetry: Abnormal symmetry  right Facial Strength: Reduced right Lingual ROM: Reduced right   Ice Chips     Thin Liquid      Nectar Thick     Honey Thick     Puree     Solid           Dollene Primrose, MS/CCC- SLP  Tedd Sias, Susie 06/08/2018,10:31 AM

## 2018-06-09 DIAGNOSIS — Z9119 Patient's noncompliance with other medical treatment and regimen: Secondary | ICD-10-CM

## 2018-06-09 DIAGNOSIS — F3181 Bipolar II disorder: Secondary | ICD-10-CM

## 2018-06-09 DIAGNOSIS — R451 Restlessness and agitation: Secondary | ICD-10-CM

## 2018-06-09 DIAGNOSIS — F17213 Nicotine dependence, cigarettes, with withdrawal: Secondary | ICD-10-CM

## 2018-06-09 LAB — HIV ANTIBODY (ROUTINE TESTING W REFLEX): HIV Screen 4th Generation wRfx: NONREACTIVE

## 2018-06-09 MED ORDER — HALOPERIDOL LACTATE 5 MG/ML IJ SOLN
5.0000 mg | Freq: Three times a day (TID) | INTRAMUSCULAR | Status: DC | PRN
  Administered 2018-06-09: 23:00:00 5 mg via INTRAMUSCULAR
  Filled 2018-06-09: qty 1

## 2018-06-09 MED ORDER — OLANZAPINE 5 MG PO TBDP
5.0000 mg | ORAL_TABLET | Freq: Every day | ORAL | Status: DC
  Administered 2018-06-09: 5 mg via ORAL
  Filled 2018-06-09 (×2): qty 1

## 2018-06-09 MED ORDER — OLANZAPINE 10 MG PO TBDP
10.0000 mg | ORAL_TABLET | Freq: Every day | ORAL | Status: DC
  Filled 2018-06-09: qty 1

## 2018-06-09 MED ORDER — ENSURE ENLIVE PO LIQD
237.0000 mL | Freq: Two times a day (BID) | ORAL | Status: DC
  Administered 2018-06-10 (×2): 237 mL via ORAL

## 2018-06-09 MED ORDER — HYDROCHLOROTHIAZIDE 25 MG PO TABS
25.0000 mg | ORAL_TABLET | Freq: Every day | ORAL | Status: DC
  Administered 2018-06-09 – 2018-06-10 (×2): 25 mg via ORAL
  Filled 2018-06-09 (×2): qty 1

## 2018-06-09 NOTE — Evaluation (Addendum)
Clinical/Bedside Swallow Evaluation Patient Details  Name: Rachel [name] MRN: [id number] Date of Birth: [birth year]  Today's Date: 06/09/2018 Time: SLP Start Time (ACUTE ONLY): 1315 SLP Stop Time (ACUTE ONLY): 1415 SLP Time Calculation (min) (ACUTE ONLY): 60 min  Past Medical History:  Past Medical History:  Diagnosis Date  . Hepatitis C   . Hypertension    Past Surgical History:  Past Surgical History:  Procedure Laterality Date  . KNEE ARTHROSCOPY     HPI:  Pt is a 63 y.o. female with a known history of Bipolar 2 disorder w/ major depressive episode, hypertension and hepatitis C who presented to the emergency room this morning with complaints of slurred speech and facial droop.  Patient denied having any focal weakness.  Patient lives with her husband at home.  Work of this morning was at baseline.  At about 8 AM patient started having slurred speech and facial droop and was transferred to the emergency room.   Patient was evaluated immediately by neurologist with NIHSS score of 4.  Imaging studies of the head with CT scan without contrast was negative.  TPA was administered by neurologist and patient subsequently admitted to the ICU for closer monitoring.  Neurology consulted, following.  At baseline at home, Patient has a mild facial droop at baseline but per report of husband this is worse today.  Speech is normal at baseline.  Patient has some memory difficulties at baseline.  Is able to dress herself, feed herself and is not incontinent.  Per ST Cognitive-linguistic evaluation performed, patient's speech is intelligible when cued to speak louder and slower.  The patient does not appear to be aware of changes in her speech; nor, does she appear aware of overall deficits to inclue weakness in RUE when attempting to feed herself.  NSG concerned today of pt's increased mastication effort w/ diet; swallowing in general.    Assessment / Plan / Recommendation Clinical Impression  Pt presents  w/ oropharyngeal phase dysphagia w/ Moderate oral phase deficits w/ increased textured foods - suspect the oral phase deficits could impact the pharyngeal phase of swallowing thus increasing risk for aspiration, choking. Pt is poorly aware of R lateral buccal residue remaining post solid trials; mastication of solids is effortful and time consuming for her.  Recommend a more MINCED diet, dysphagia level 2, w/ thin liquids - strict aspiration precautions and monitoring of toleration of thin liquids d/t new dysphagia and CVA. Unsure if this presentation has worsened since admission. Recommend Pills w/ Ensure or in Puree for safer swallowing; supervision by NSG. Recommend monitoring at meals and assistance w/ feeding d/t RUE weakness and inability to feed self w/ that dominant hand. Encourage/give verbal and tactile cues for lingual sweeping to clear R lateral/buccal areas of mouth during/post po's. Pt will benefit from skilled ST Services at D/C to address both dysphagia and dysarthria. SLP Visit Diagnosis: Dysphagia, oropharyngeal phase (R13.12)    Aspiration Risk  Mild aspiration risk;Risk for inadequate nutrition/hydration    Diet Recommendation  Dysphagia level 2 (MINCED foods) w/ thin liquids currently; aspiration precautions including lingual sweeping to clear R latera/buccal area during/post meals. Monitoring by NSG staff during meals - assist w/ feeding during meals also d/t RUE weakness  Medication Administration: Whole meds with puree(if unable to tolerate w/ Ensure)    Other  Recommendations Recommended Consults: (Dietician f/u) Oral Care Recommendations: Oral care BID;Staff/trained caregiver to provide oral care Other Recommendations: (n/a currently)   Follow up Recommendations Skilled Nursing facility(TBD)  Frequency and Duration min 3x week  2 weeks       Prognosis Prognosis for Safe Diet Advancement: Fair Barriers to Reach Goals: Cognitive deficits;Time post onset;Severity of  deficits      Swallow Study   General Date of Onset: 06/07/18 HPI: Pt is a 63 y.o. female with a known history of Bipolar 2 disorder w/ major depressive episode, hypertension and hepatitis C who presented to the emergency room this morning with complaints of slurred speech and facial droop.  Patient denied having any focal weakness.  Patient lives with her husband at home.  Work of this morning was at baseline.  At about 8 AM patient started having slurred speech and facial droop and was transferred to the emergency room.   Patient was evaluated immediately by neurologist with NIHSS score of 4.  Imaging studies of the head with CT scan without contrast was negative.  TPA was administered by neurologist and patient subsequently admitted to the ICU for closer monitoring.  Neurology consulted, following.  At baseline at home, Patient has a mild facial droop at baseline but per report of husband this is worse today.  Speech is normal at baseline.  Patient has some memory difficulties at baseline.  Is able to dress herself, feed herself and is not incontinent.  Per ST Cognitive-linguistic evaluation performed, patient's speech is intelligible when cued to speak louder and slower.  The patient does not appear to be aware of changes in her speech; nor, does she appear aware of overall deficits to inclue weakness in RUE when attempting to feed herself.  NSG concerned today of pt's increased mastication effort w/ diet; swallowing in general.  Type of Study: Bedside Swallow Evaluation Previous Swallow Assessment: none reported Diet Prior to this Study: Regular;Thin liquids Temperature Spikes Noted: No(wbc 9.4) Respiratory Status: Room air History of Recent Intubation: No Behavior/Cognition: Alert;Cooperative;Pleasant mood;Confused;Distractible;Requires cueing(R facial and UE weakness) Oral Cavity Assessment: (min food debris in R buccal area) Oral Care Completed by SLP: Yes Oral Cavity - Dentition: Missing  dentition Vision: Functional for self-feeding Self-Feeding Abilities: Able to feed self;Needs assist;Needs set up(unable to use RUE) Patient Positioning: Upright in bed(needed positioning) Baseline Vocal Quality: Low vocal intensity(mild+ Dysarthria) Volitional Cough: Strong Volitional Swallow: Able to elicit    Oral/Motor/Sensory Function Overall Oral Motor/Sensory Function: Mild impairment(+) Facial ROM: Reduced right Facial Symmetry: Abnormal symmetry right Facial Strength: Reduced right Facial Sensation: Reduced right Lingual ROM: Reduced right(min) Lingual Symmetry: Abnormal symmetry right(min) Lingual Strength: Reduced(for sweeping) Lingual Sensation: Reduced Mandible: Within Functional Limits   Ice Chips Ice chips: Not tested   Thin Liquid Thin Liquid: Impaired Presentation: Cup;Self Fed;Straw(4 trials via each method) Oral Phase Impairments: Reduced labial seal(min) Pharyngeal  Phase Impairments: Cough - Delayed;Suspected delayed Swallow(x1 w/ cup trial - also w/ food debris in mouth)    Nectar Thick Nectar Thick Liquid: Not tested   Honey Thick Honey Thick Liquid: Not tested   Puree Puree: Impaired Presentation: Self Fed;Spoon(6 trials) Oral Phase Impairments: Reduced labial seal;Reduced lingual movement/coordination;Poor awareness of bolus Oral Phase Functional Implications: Prolonged oral transit;Oral residue;Right lateral sulci pocketing Pharyngeal Phase Impairments: (none immediately)   Solid     Solid: Impaired Presentation: Spoon;Self Fed(4 trials) Oral Phase Impairments: Reduced labial seal;Reduced lingual movement/coordination;Impaired mastication(pot roast) Oral Phase Functional Implications: Right lateral sulci pocketing;Prolonged oral transit;Impaired mastication;Oral residue Pharyngeal Phase Impairments: (none) Other Comments: expectorated piece of peach not masticated found in R lateral buccal       Jerilynn SomKatherine Rudra Hobbins, MS,  CCC-SLP  Sherlie Boyum 06/09/2018,4:23 PM

## 2018-06-09 NOTE — Progress Notes (Signed)
Subjective: Patient resting.  Remains somewhat agitated.  Objective: Current vital signs: BP 124/73 (BP Location: Left Arm)   Pulse 88   Temp 97.6 F (36.4 C)   Resp 18   Ht  (1.702 m)   Wt 57.6 kg   SpO2 99%   BMI 19.89 kg/m  Vital signs in last 24 hours: Temp:  [97.4 F (36.3 C)-98.4 F (36.9 C)] 97.6 F (36.4 C) (05/07 0850) Pulse Rate:  [75-125] 88 (05/07 0850) Resp:  [18-20] 18 (05/07 0850) BP: (124-200)/(73-118) 124/73 (05/07 0850) SpO2:  [96 %-100 %] 99 % (05/07 0850)  Intake/Output from previous day: 05/06 0701 - 05/07 0700 In: 461.7 [P.O.:120; I.V.:341.7] Out: 600 [Urine:600] Intake/Output this shift: No intake/output data recorded. Nutritional status:  Diet Order            DIET SOFT Room service appropriate? Yes; Fluid consistency: Thin  Diet effective now              Neurologic Exam: Mental Status: Alert. Agitated.Speech fluent but markedly dysarthric. Able to follow 3 step commands without difficulty. Cranial Nerves: II: Discs flat bilaterally; Visual fields grossly normal, pupils equal, round, reactive to light and accommodation III,IV, VI: ptosis not present, extra-ocular motions intact bilaterally V,VII:right facial droop, facial light touch sensation normal bilaterally VIII: hearing normal bilaterally IX,X: gag reflex present XI: bilateral shoulder shrug XII: midline tongue extension Motor: 5/5 throughout Sensory: Pinprick and light touch intact throughout, bilaterally   Lab Results: Basic Metabolic Panel: Recent Labs  Lab 06/07/18 1006 06/08/18 0451  NA 142 137  K 3.7 3.5  CL 105 108  CO2 28 19*  GLUCOSE 108* 121*  BUN 20 14  CREATININE 0.89 0.67  CALCIUM 8.7* 8.6*  MG  --  2.2    Liver Function Tests: Recent Labs  Lab 06/07/18 1006  AST 14*  ALT 14  ALKPHOS 108  BILITOT 0.3  PROT 6.9  ALBUMIN 3.7   No results for input(s): LIPASE, AMYLASE in the last 168 hours. No results for input(s): AMMONIA in the  last 168 hours.  CBC: Recent Labs  Lab 06/07/18 1006 06/08/18 0451  WBC 8.8 9.4  NEUTROABS 6.5  --   HGB 13.6 14.4  HCT 42.2 43.7  MCV 87.2 84.9  PLT 308 314    Cardiac Enzymes: No results for input(s): CKTOTAL, CKMB, CKMBINDEX, TROPONINI in the last 168 hours.  Lipid Panel: Recent Labs  Lab 06/08/18 0451  CHOL 236*  TRIG 97  HDL 55  CHOLHDL 4.3  VLDL 19  LDLCALC 161*    CBG: Recent Labs  Lab 06/07/18 1016 06/07/18 1310  GLUCAP 104* 116*    Microbiology: Results for orders placed or performed during the hospital encounter of 06/07/18  SARS Coronavirus 2 (CEPHEID - Performed in [facility] hospital lab), Hosp Order     Status: None   Collection Time: 06/07/18 11:57 AM  Result Value Ref Range Status   SARS Coronavirus 2 NEGATIVE NEGATIVE Final    Comment: (NOTE) If result is NEGATIVE SARS-CoV-2 target nucleic acids are NOT DETECTED. The SARS-CoV-2 RNA is generally detectable in upper and lower  respiratory specimens during the acute phase of infection. The lowest  concentration of SARS-CoV-2 viral copies this assay can detect is 250  copies / mL. A negative result does not preclude SARS-CoV-2 infection  and should not be used as the sole basis for treatment or other  patient management decisions.  A negative result may occur with  improper specimen  collection / handling, submission of specimen other  than nasopharyngeal swab, presence of viral mutation(s) within the  areas targeted by this assay, and inadequate number of viral copies  (<250 copies / mL). A negative result must be combined with clinical  observations, patient history, and epidemiological information. If result is POSITIVE SARS-CoV-2 target nucleic acids are DETECTED. The SARS-CoV-2 RNA is generally detectable in upper and lower  respiratory specimens dur ing the acute phase of infection.  Positive  results are indicative of active infection with SARS-CoV-2.  Clinical  correlation with  patient history and other diagnostic information is  necessary to determine patient infection status.  Positive results do  not rule out bacterial infection or co-infection with other viruses. If result is PRESUMPTIVE POSTIVE SARS-CoV-2 nucleic acids MAY BE PRESENT.   A presumptive positive result was obtained on the submitted specimen  and confirmed on repeat testing.  While 2019 novel coronavirus  (SARS-CoV-2) nucleic acids may be present in the submitted sample  additional confirmatory testing may be necessary for epidemiological  and / or clinical management purposes  to differentiate between  SARS-CoV-2 and other Sarbecovirus currently known to infect humans.  If clinically indicated additional testing with an alternate test  methodology [phone]) is advised. The SARS-CoV-2 RNA is generally  detectable in upper and lower respiratory sp ecimens during the acute  phase of infection. The expected result is Negative. Fact Sheet for Patients:  BoilerBrush.com.cy Fact Sheet for Healthcare Providers: https://pope.com/ This test is not yet approved or cleared by the [place] FDA and has been authorized for detection and/or diagnosis of SARS-CoV-2 by FDA under an Emergency Use Authorization (EUA).  This EUA will remain in effect (meaning this test can be used) for the duration of the COVID-19 declaration under Section 564(b)(1) of the Act, 21 U.S.C. section 360bbb-3(b)(1), unless the authorization is terminated or revoked sooner. Performed at [facility], [address] [place], [place] [address]   MRSA PCR Screening     Status: None   Collection Time: 06/07/18  1:16 PM  Result Value Ref Range Status   MRSA by PCR NEGATIVE NEGATIVE Final    Comment:        The GeneXpert MRSA Assay (FDA approved for NASAL specimens only), is one component of a comprehensive MRSA colonization surveillance program. It is not intended to  diagnose MRSA infection nor to guide or monitor treatment for MRSA infections. Performed at [facility], [address] [place], [place] [address]     Coagulation Studies: Recent Labs    06/07/18 1006  LABPROT 12.5  INR 0.9    Imaging: Ct Head Wo Contrast  Result Date: 06/08/2018 CLINICAL DATA:  Stroke follow-up. EXAM: CT HEAD WITHOUT CONTRAST TECHNIQUE: Contiguous axial images were obtained from the base of the skull through the vertex without intravenous contrast. COMPARISON:  MR brain and CT head from yesterday. FINDINGS: Brain: Small vague area of hypodensity in the region of the left posterior limb of the internal capsule, corresponding to the acute infarct seen on MRI. No evidence of hemorrhage, hydrocephalus, extra-axial collection or mass lesion/mass effect. Old bilateral thalamic and right cerebellar lacunar infarcts again noted. Unchanged mild atrophy. Vascular: Atherosclerotic vascular calcification of the carotid siphons. No hyperdense vessel. Skull: Normal. Negative for fracture or focal lesion. Sinuses/Orbits: No acute finding. Other: None. IMPRESSION: 1. Small vague area of hypodensity in the region of the left posterior limb of the internal capsule, corresponding to the acute infarct seen on MRI. No  hemorrhage. Electronically Signed   By: Obie DredgeWilliam T Derry M.D.   On: 06/08/2018 12:13   Mr Brain Wo Contrast  Result Date: 06/07/2018 CLINICAL DATA:  Slurred speech.  RIGHT facial droop. EXAM: MRI HEAD WITHOUT CONTRAST MRA HEAD WITHOUT CONTRAST TECHNIQUE: Multiplanar, multiecho pulse sequences of the brain and surrounding structures were obtained without intravenous contrast. Angiographic images of the head were obtained using MRA technique without contrast. COMPARISON:  CT head 06/07/2018 FINDINGS: MRI HEAD FINDINGS Brain: 1 cm area of restricted diffusion, corresponding low ADC, posterior limb internal capsule on the LEFT, adjacent to the posterior lentiform nucleus and  lateral thalamus consistent with acute infarction. No hemorrhage, mass lesion, hydrocephalus, or extra-axial fluid. Generalized atrophy. Chronic microvascular ischemic change affects the periventricular and subcortical white matter as well as the brainstem. Scattered areas of chronic lacunar infarction are seen bilaterally including both thalami and both cerebellar hemispheres. Vascular: Reported separately. Skull and upper cervical spine: Normal marrow signal. Sinuses/Orbits: Negative. Other: None MRA HEAD FINDINGS RIGHT ANTERIOR CIRCULATION: RIGHT ICA demonstrates mild irregularity in its petrous and cavernous segments. No supraclinoid stenosis. Patent proximal anterior and middle cerebral arteries. Moderately diseased inferior M2 MCA bifurcation branch. LEFT A1 anterior cerebral artery dominant contributor to the parasagittal circulation. LEFT ANTERIOR CIRCULATION: Mild narrowing at the junction of the cervical and horizontal petrous segments. 75-90% stenosis of the skull base ICA at the petrous inferior cavernous junction. Additional tandem 75% stenosis mid cavernous segment. Supraclinoid ICA widely patent. Diseased, and functionally occluded, LEFT A1 anterior cerebral artery. LEFT M1 MCA patent but mildly irregular. Irregular but patent M2 MCA bifurcation branches. POSTERIOR CIRCULATION: Basilar artery is dolichoectatic but patent, mildly irregular in its proximal segment. Both vertebrals contribute to its formation, and there is no significant V4 vertebral stenosis. Both posterior cerebral arteries are patent in their proximal segments but there is moderate irregularity of the LEFT P2/P3 junction. Severely diseased BILATERAL superior cerebellar artery segments. Poor visualization of the anterior inferior cerebellar arteries as well as the proximal RIGHT PICA. IMPRESSION: Acute 1 cm infarct, likely LEFT anterior choroidal artery territory, affecting posterior limb internal capsule and lateral thalamus, possible  medial lentiform nucleus. This is nonhemorrhagic. Atrophy and small vessel disease. Tandem flow reducing stenoses of the LEFT internal carotid artery in the petrous and cavernous segments. Functionally occluded LEFT A1 ACA. Widespread intracranial atherosclerotic disease, including cerebellar branches. See discussion above Electronically Signed   By: Elsie StainJohn T Curnes M.D.   On: 06/07/2018 16:08   [place] Carotid Bilateral (at [facility] Only)  Result Date: 06/07/2018 CLINICAL DATA:  63 year old female with stroke EXAM: BILATERAL CAROTID DUPLEX ULTRASOUND TECHNIQUE: Wallace CullensGray scale imaging, color Doppler and duplex ultrasound were performed of bilateral carotid and vertebral arteries in the neck. COMPARISON:  No prior duplex FINDINGS: Criteria: Quantification of carotid stenosis is based on velocity parameters that correlate the residual internal carotid diameter with NASCET-based stenosis levels, using the diameter of the distal internal carotid lumen as the denominator for stenosis measurement. The following velocity measurements were obtained: RIGHT ICA:  Systolic 58 cm/sec, Diastolic 16 cm/sec CCA:  51 cm/sec SYSTOLIC ICA/CCA RATIO:  1.1 ECA:  68 cm/sec LEFT ICA:  Systolic 63 cm/sec, Diastolic 161820 cm/sec CCA:  46 cm/sec SYSTOLIC ICA/CCA RATIO:  1.4 ECA:  24 cm/sec Right Brachial SBP: Not acquired Left Brachial SBP: Not acquired RIGHT CAROTID ARTERY: No significant calcified disease of the right common carotid artery. Intermediate waveform maintained. Heterogeneous plaque without significant calcifications at the right carotid bifurcation. Low resistance waveform of  the right ICA. No significant tortuosity. RIGHT VERTEBRAL ARTERY: Antegrade flow with low resistance waveform. LEFT CAROTID ARTERY: No significant calcified disease of the left common carotid artery. Intermediate waveform maintained. Heterogeneous plaque at the left carotid bifurcation without significant calcifications. Low resistance waveform of the left ICA.  LEFT VERTEBRAL ARTERY:  Antegrade flow with low resistance waveform. IMPRESSION: Color duplex indicates minimal heterogeneous plaque, with no hemodynamically significant stenosis by duplex criteria in the extracranial cerebrovascular circulation. Signed, Yvone Neu. Reyne Dumas, RPVI [facility] Radiology Electronically Signed   By: Gilmer Mor D.O.   On: 06/07/2018 14:44   Mr Maxine Glenn Head Wo Contrast  Result Date: 06/07/2018 CLINICAL DATA:  Slurred speech.  RIGHT facial droop. EXAM: MRI HEAD WITHOUT CONTRAST MRA HEAD WITHOUT CONTRAST TECHNIQUE: Multiplanar, multiecho pulse sequences of the brain and surrounding structures were obtained without intravenous contrast. Angiographic images of the head were obtained using MRA technique without contrast. COMPARISON:  CT head 06/07/2018 FINDINGS: MRI HEAD FINDINGS Brain: 1 cm area of restricted diffusion, corresponding low ADC, posterior limb internal capsule on the LEFT, adjacent to the posterior lentiform nucleus and lateral thalamus consistent with acute infarction. No hemorrhage, mass lesion, hydrocephalus, or extra-axial fluid. Generalized atrophy. Chronic microvascular ischemic change affects the periventricular and subcortical white matter as well as the brainstem. Scattered areas of chronic lacunar infarction are seen bilaterally including both thalami and both cerebellar hemispheres. Vascular: Reported separately. Skull and upper cervical spine: Normal marrow signal. Sinuses/Orbits: Negative. Other: None MRA HEAD FINDINGS RIGHT ANTERIOR CIRCULATION: RIGHT ICA demonstrates mild irregularity in its petrous and cavernous segments. No supraclinoid stenosis. Patent proximal anterior and middle cerebral arteries. Moderately diseased inferior M2 MCA bifurcation branch. LEFT A1 anterior cerebral artery dominant contributor to the parasagittal circulation. LEFT ANTERIOR CIRCULATION: Mild narrowing at the junction of the  cervical and horizontal petrous segments. 75-90% stenosis of the skull base ICA at the petrous inferior cavernous junction. Additional tandem 75% stenosis mid cavernous segment. Supraclinoid ICA widely patent. Diseased, and functionally occluded, LEFT A1 anterior cerebral artery. LEFT M1 MCA patent but mildly irregular. Irregular but patent M2 MCA bifurcation branches. POSTERIOR CIRCULATION: Basilar artery is dolichoectatic but patent, mildly irregular in its proximal segment. Both vertebrals contribute to its formation, and there is no significant V4 vertebral stenosis. Both posterior cerebral arteries are patent in their proximal segments but there is moderate irregularity of the LEFT P2/P3 junction. Severely diseased BILATERAL superior cerebellar artery segments. Poor visualization of the anterior inferior cerebellar arteries as well as the proximal RIGHT PICA. IMPRESSION: Acute 1 cm infarct, likely LEFT anterior choroidal artery territory, affecting posterior limb internal capsule and lateral thalamus, possible medial lentiform nucleus. This is nonhemorrhagic. Atrophy and small vessel disease. Tandem flow reducing stenoses of the LEFT internal carotid artery in the petrous and cavernous segments. Functionally occluded LEFT A1 ACA. Widespread intracranial atherosclerotic disease, including cerebellar branches. See discussion above Electronically Signed   By: Elsie Stain M.D.   On: 06/07/2018 16:08    Medications:  I have reviewed the patient's current medications. Scheduled: . aspirin EC  81 mg Oral Daily  . atorvastatin  40 mg Oral q1800  . clopidogrel  75 mg Oral Daily  . hydrochlorothiazide  25 mg Oral Daily  . nicotine  14 mg Transdermal Daily  . OLANZapine zydis  20 mg Oral QHS  . pantoprazole  40 mg Oral QHS    Assessment/Plan: Patient without new focal complaints.  On ASA, Plavix and statin.  Echocardiogram  shows no cardiac source of emboli with an EF of 55-60%.  BP  131/85.  Recommendations: 1. Agree with current management   LOS: 2 days   Thana Farr, MD Neurology [phone] 06/09/2018  11:23 AM

## 2018-06-09 NOTE — Progress Notes (Signed)
[facility] [place] at [facility]   PATIENT NAME: English Acker    MR#:  [id number]  DATE OF BIRTH:  Nov 13, 1955  SUBJECTIVE:  CHIEF COMPLAINT:   Chief Complaint  Patient presents with   Weakness  Patient was admitted with diagnosis of acute CVA and had TPA administration and subsequently admitted to the ICU.  Patient has been transferred out of ICU. Last night patient was reported to have been confused and had a fall on her bottom and was found sitting on the bed floor which patient had in the room due to fall risk precautions.  This morning patient resting comfortably.  Denies any pains.  REVIEW OF SYSTEMS:  ROS Constitutional: Negative for chills, fever and malaise/fatigue.  HENT: Negative for sore throat.   Eyes: Negative for blurred vision and double vision.  Respiratory: Negative for cough, hemoptysis, shortness of breath, wheezing and stridor.   Cardiovascular: Negative for chest pain, palpitations, orthopnea and leg swelling.  Gastrointestinal: Negative for abdominal pain, blood in stool, diarrhea, melena, nausea and vomiting.  Genitourinary: Negative for dysuria, flank pain and hematuria.  Musculoskeletal: Negative for back pain and joint pain.  Skin: Negative for rash.  Neurological: Negative for dizziness, sensory change, focal weakness, seizures, loss of consciousness, weakness and headaches.  Endo/Heme/Allergies: Negative for polydipsia.  Psychiatric/Behavioral: Negative for depression.   DRUG ALLERGIES:   Allergies  Allergen Reactions   Tetracycline Hives   Penicillins Rash   Tetracyclines & Related Rash   VITALS:  Blood pressure 124/73, pulse 88, temperature 97.6 F (36.4 C), resp. rate 18, height 5\' 7"  (1.702 m), weight 57.6 kg, SpO2 99 %. PHYSICAL EXAMINATION:  Physical Exam  Constitutional: She appears well-developed and well-nourished.  HENT:  Head: Normocephalic and atraumatic.  Eyes: Pupils are equal, round, and reactive to  light. Conjunctivae are normal. Right eye exhibits no discharge.  Neck: Normal range of motion. Neck supple.  Cardiovascular: Normal rate, regular rhythm and normal heart sounds.  Respiratory: Effort normal and breath sounds normal. No stridor. She has no wheezes.  GI: Soft. Bowel sounds are normal. There is no abdominal tenderness.  Musculoskeletal: Normal range of motion.        General: No edema.  Neurological: She is alert.  Moving all extremities with no focal deficit.  Mild right facial droop  Skin: Skin is warm. No erythema.  Psychiatric: She has a normal mood and affect. Her behavior is normal.   LABORATORY PANEL:  Female CBC Recent Labs  Lab 06/08/18 0451  WBC 9.4  HGB 14.4  HCT 43.7  PLT 314   ------------------------------------------------------------------------------------------------------------------ Chemistries  Recent Labs  Lab 06/07/18 1006 06/08/18 0451  NA 142 137  K 3.7 3.5  CL 105 108  CO2 28 19*  GLUCOSE 108* 121*  BUN 20 14  CREATININE 0.89 0.67  CALCIUM 8.7* 8.6*  MG  --  2.2  AST 14*  --   ALT 14  --   ALKPHOS 108  --   BILITOT 0.3  --    RADIOLOGY:  Ct Head Wo Contrast  Result Date: 06/08/2018 CLINICAL DATA:  Stroke follow-up. EXAM: CT HEAD WITHOUT CONTRAST TECHNIQUE: Contiguous axial images were obtained from the base of the skull through the vertex without intravenous contrast. COMPARISON:  MR brain and CT head from yesterday. FINDINGS: Brain: Small vague area of hypodensity in the region of the left posterior limb of the internal capsule, corresponding to the acute infarct seen on MRI. No evidence of hemorrhage, hydrocephalus,  extra-axial collection or mass lesion/mass effect. Old bilateral thalamic and right cerebellar lacunar infarcts again noted. Unchanged mild atrophy. Vascular: Atherosclerotic vascular calcification of the carotid siphons. No hyperdense vessel. Skull: Normal. Negative for fracture or focal lesion. Sinuses/Orbits: No acute  finding. Other: None. IMPRESSION: 1. Small vague area of hypodensity in the region of the left posterior limb of the internal capsule, corresponding to the acute infarct seen on MRI. No hemorrhage. Electronically Signed   By: Obie DredgeWilliam T Derry M.D.   On: 06/08/2018 12:13   ASSESSMENT AND PLAN:   Patient is a 63 year old with history of hypertension and hep C who was admitted to the ICU following administration of TPA for acute CVA . Neurology service already following patient.  1.Acute CVA Presented with slurred speech and facial droop. CT head without contrast was negative for any acute findings. Patient status post TPA and subsequently admitted to the ICU.  MRI MRA of the brain without contrast report left 1 cm infarct affecting posterior limb internal capsule and lateral thalamus, possible medial lentiform nucleus. 2D echocardiogram done but report still pending.Carotid Doppler ultrasound is unremarkable. Physical therapy came to evaluate patient today but patient was very sleepy.  Follow-up on evaluation and recommendations.   The patient is on soft diet after swallowing study. Repeat CT of the head report CVA but no hemorrhage. Patient reevaluated by neurologist yesterday and recommendation is for aspirin 81 mg p.o. daily with Plavix 75 mg p.o. daily for 1 month and then return to monotherapy with aspirin after 1 month.  Continue statins  2.Hypertension Allowed permissive hypertension due to acute CVA. Resumed hydrochlorothiazide  3.Tobacco abuse Smoking cessation was counseled for 3-4 minutes, on nicotine patch.  4.Documented history of hep C  Stable. Follow-up with her gastroenterologist as outpatient   5.Mild cognitive dysfunction Patient with episodes of confusion.  Seen by psychiatrist.  Patient does not meet criteria for psychiatric inpatient admission.  Patient was restarted on Zyprexa 20 mg p.o. nightly.  Resting comfortably this morning. I called and updated  patient's husband on treatment plans today.  He confirmed that the Tegretol and Zyprexa which was listed on patient's medication list, but patient has not been very compliant and has not been taking those medications for a long time.  6. Hyperlipidemia.  LDL 162, start Lipitor.  DVT prophylaxis; patient has SCDs ordered due to recent administration of TPA.  All the records are reviewed and case discussed with Care Management/Social Worker. Management plans discussed with the patient, family and they are in agreement.  CODE STATUS: DNR  TOTAL TIME TAKING CARE OF THIS PATIENT: 37 minutes.   More than 50% of the time was spent in counseling/coordination of care: YES  POSSIBLE D/C IN 1-2 DAYS, DEPENDING ON CLINICAL CONDITION and recommendations from physical therapist..   Renardo Cheatum M.D on 06/09/2018 at 11:04 AM  Between 7am to 6pm - Pager - [phone]  After 6pm go to [email or web address] - Social research officer, [email or web address] [email or web address]  [facility]  Office  [phone]  CC: Primary care physician; Patient, No Pcp Per  Note: This dictation was prepared with Dragon dictation along with smaller phrase technology. Any transcriptional errors that result from this process are unintentional.

## 2018-06-09 NOTE — Progress Notes (Signed)
OT Cancellation Note  Patient Details Name: Rachel [name] MRN: [id number] DOB: Jul 17, 1955   Cancelled Treatment:    Reason Eval/Treat Not Completed: Fatigue/lethargy limiting ability to participate(Pt. sleeping soundly this a.m. will attempt at a later time/date.)  Olegario Messier, MS, OTR/L 06/09/2018, 12:34 PM

## 2018-06-09 NOTE — TOC Initial Note (Signed)
Transition of Care [facility]) - Initial/Assessment Note    Patient Details  Name: Rachel [name] MRN: [id number] Date of Birth: 1955-03-26  Transition of Care [facility]) CM/SW Contact:    Annamaria Boots, Alicia Phone Number: 06/09/2018, 3:50 PM  Clinical Narrative:  Patient is being recommended for SNF. CSW met with patient to discuss discharge plan. Patient states that she lives with her husband in [place] and plans to return home. Patient reports that she is independent and does not use any DME. CSW explained PT recommendations of SNF. Patient states that she will not go to a facility and will go back home. CSW offered to set up home health services at home and patient is agreeable. Patient is agreeable to [place]. CSW also contacted patient's husband Jeanny Rymer [phone]. Husband is also agreeable to above. CSW notified Corene Cornea with [facility] of referral. CSW also set up new PCP appointment. Per husband patient gets medications [facility] [place]. CSW will continue to follow for discharge planning.                 Expected Discharge Plan: [place] Barriers to Discharge: Continued Medical Work up   Patient Goals and CMS Choice Patient states their goals for this hospitalization and ongoing recovery are:: " I want to go home"  CMS Medicare.gov Compare Post Acute Care list provided to:: Patient Choice offered to / list presented to : Patient  Expected Discharge Plan and Services Expected Discharge Plan: [place]   Discharge Planning Services: CM Consult Post Acute Care Choice: [place] arrangements for the past 2 months: [place] Arranged: PT, OT [place] Agency: [place] ([facility]) Date [place]: 06/09/18 Time [place]: [place] Representative spoke with at [place]: Floydene Flock   Prior Living Arrangements/Services Living arrangements for the past 2  months: [place] Lives with:: Spouse Patient language and need for interpreter reviewed:: Yes Do you feel safe going back to the place where you live?: Yes      Need for Family Participation in Patient Care: Yes (Comment) Care giver support system in place?: Yes (comment)   Criminal Activity/Legal Involvement Pertinent to Current Situation/Hospitalization: No - Comment as needed  Activities of Daily Living      Permission Sought/Granted Permission sought to share information with : Case Manager, Customer service manager, Family Supports Permission granted to share information with : Yes, Verbal Permission Granted              Emotional Assessment Appearance:: Appears stated age Attitude/Demeanor/Rapport: Lethargic Affect (typically observed): Calm, Appropriate Orientation: : Oriented to Self, Oriented to Place, Oriented to  Time Alcohol / Substance Use: Not Applicable Psych Involvement: Yes (comment)  Admission diagnosis:  Cerebrovascular accident (CVA), unspecified mechanism ([place]) [I63.9] Patient Active Problem List   Diagnosis Date Noted  . Acute cerebral infarction ([place]) 06/07/2018  . CVA (cerebral vascular accident) ([place]) 06/07/2018  . Vitamin B12 deficiency 05/06/2016  . Cerebrovascular disease 05/04/2016  . Bipolar 2 disorder, major depressive episode ([place]) 05/02/2016  . Tobacco use disorder 05/02/2016  . Opioid use disorder, mild, abuse ([place]) 05/02/2016  . HTN (hypertension) 05/02/2016   PCP:  Patient, No Pcp Per Pharmacy:   [facility] [place], [address]  [place] [address] Phone: [phone] Fax: [phone]     Social Determinants of Health (SDOH) Interventions    Readmission Risk Interventions No flowsheet data found.

## 2018-06-09 NOTE — Progress Notes (Signed)
Messaged Dr Enid Baas via Secure Chat re need for PIV. States needs to have PIV while inpatient.

## 2018-06-09 NOTE — Evaluation (Signed)
Physical Therapy Evaluation Patient Details Name: Rachel [name] MRN: [id number] DOB: 30-May-1955 Today's Date: 06/09/2018   History of Present Illness  Pt is a 63 y.o. female with a known history of hypertension and hepatitis C who presented to the emergency the morning of 5/5 with complaints of slurred speech, facial droop, and off balance walking.  MRI (+) for acute 1 cm infarct, likely LEFT anterior choroidal artery territory, affecting posterior limb internal capsule and lateral thalamus, possible medial lentiform nucleus. This is nonhemorrhagic. Atrophy and small vessel disease.  Repeat CT of head (d/t s/p TPA) negative for hemorrhage.  Clinical Impression  Prior to hospital admission, pt reports being independent with functional mobility.  Pt reports living with her husband and family in 2 level home (bedroom 2nd floor).  Overall pt appearing impulsive during session but redirectable.  Oriented to person and place but general confusion noted during session.  R UE and R LE weakness noted; slurred speech with R facial droop noted.  Currently pt is mod assist to stand and CGA x1 plus mod assist x1 to ambulate 70 feet with RW (increased R sided weakness and R lateral lean noted with increased distance ambulating).  Pt would benefit from skilled PT to address noted impairments and functional limitations (see below for any additional details).  Upon hospital discharge, recommend pt discharge to STR.    Follow Up Recommendations SNF    Equipment Recommendations  Rolling walker with 5" wheels    Recommendations for Other Services OT consult     Precautions / Restrictions Precautions Precautions: Fall Restrictions Weight Bearing Restrictions: No      Mobility  Bed Mobility Overal bed mobility: Needs Assistance Bed Mobility: Supine to Sit     Supine to sit: Min guard;HOB elevated Sit to supine: Min guard;HOB elevated   General bed mobility comments: CGA for safety (pt  impulsive)  Transfers Overall transfer level: Needs assistance Equipment used: 1 person hand held assist;Rolling walker (2 wheeled) Transfers: Sit to/from Stand Sit to Stand: Mod assist (x2 trials)         General transfer comment: mod assist to stand with single UE support or with RW; vc's for UE/LE placement; assist to initiate stand  Ambulation/Gait Ambulation/Gait assistance: +2 physical assistance;Min guard;Mod assist Gait Distance (Feet): 70 Feet Assistive device: Rolling walker (2 wheeled)   Gait velocity: decreased   General Gait Details: decreased stance time R LE; increased R UE elbow flexion and R knee flexion (and subsequent R lateral lean) with increased distance ambulating; decreased R LE heel strike/step length/foot clearance; limited distance d/t fatigue  Stairs            Wheelchair Mobility    Modified Rankin (Stroke Patients Only)       Balance Overall balance assessment: Needs assistance Sitting-balance support: No upper extremity supported;Feet supported Sitting balance-Leahy Scale: Good Sitting balance - Comments: steady static sitting reaching within BOS (although appearing impulsive/quick with movements)   Standing balance support: Single extremity supported Standing balance-Leahy Scale: Poor Standing balance comment: pt requiring at least single UE support for static standing balance                             Pertinent Vitals/Pain Pain Assessment: Faces Faces Pain Scale: No hurt Pain Intervention(s): Limited activity within patient's tolerance;Monitored during session;Repositioned  Vitals (HR and O2 on room air) stable and WFL throughout treatment session.    Home Living Family/patient expects to  be discharged to:: Private residence Living Arrangements: Spouse/significant other;Other relatives(Daughter, son in law, and grandson)   Type of Home: House Home Access: Stairs to enter Entrance Stairs-Rails: None Entrance  Stairs-Number of Steps: 1 Home Layout: Two level;Bed/bath upstairs Home Equipment: None      Prior Function Level of Independence: Independent         Comments: Pt reports being independent with all functional mobility prior to recent symptoms.     Hand Dominance        Extremity/Trunk Assessment   Upper Extremity Assessment Upper Extremity Assessment: Defer to OT evaluation    Lower Extremity Assessment Lower Extremity Assessment: RLE deficits/detail;LLE deficits/detail(intact B LE proprioception, tone, and light touch) RLE Deficits / Details: hip flexion 3-/5; knee flexion 3/5; knee extension 4/5; DF 4/5; decreased quality R LE heel to shin coordination LLE Deficits / Details: hip flexion at least 3/5 AROM; knee flexion/extension at least 3+/5; DF/PF at least 3/5 AROM    Cervical / Trunk Assessment Cervical / Trunk Assessment: Normal  Communication   Communication: (Difficult to understand speech intermittently)  Cognition Arousal/Alertness: Awake/alert Behavior During Therapy: Impulsive;Flat affect;Restless Overall Cognitive Status: No family/caregiver present to determine baseline cognitive functioning                                 General Comments: Oriented to person and place      General Comments   Nursing cleared pt for participation in physical therapy.  Pt agreeable to PT session.    Exercises  Gait training   Assessment/Plan    PT Assessment Patient needs continued PT services  PT Problem List Decreased strength;Decreased activity tolerance;Decreased balance;Decreased mobility;Decreased coordination;Decreased cognition;Decreased knowledge of use of DME;Decreased safety awareness;Decreased knowledge of precautions       PT Treatment Interventions DME instruction;Gait training;Stair training;Functional mobility training;Therapeutic activities;Therapeutic exercise;Balance training;Patient/family education    PT Goals (Current goals can  be found in the Care Plan section)  Acute Rehab PT Goals Patient Stated Goal: to go home; improve walking PT Goal Formulation: With patient Time For Goal Achievement: 06/23/18 Potential to Achieve Goals: Fair    Frequency 7X/week   Barriers to discharge Decreased caregiver support      Co-evaluation               AM-PAC PT "6 Clicks" Mobility  Outcome Measure Help needed turning from your back to your side while in a flat bed without using bedrails?: None Help needed moving from lying on your back to sitting on the side of a flat bed without using bedrails?: A Little Help needed moving to and from a bed to a chair (including a wheelchair)?: A Little Help needed standing up from a chair using your arms (e.g., wheelchair or bedside chair)?: A Lot Help needed to walk in hospital room?: A Lot Help needed climbing 3-5 steps with a railing? : Total 6 Click Score: 15    End of Session Equipment Utilized During Treatment: Gait belt Activity Tolerance: Patient limited by fatigue Patient left: in bed;with call bell/phone within reach;with bed alarm set;Other (comment)(bed lowest position; B fall mats in place) Nurse Communication: Mobility status;Precautions PT Visit Diagnosis: Other abnormalities of gait and mobility (R26.89);Muscle weakness (generalized) (M62.81);History of falling (Z91.81);Difficulty in walking, not elsewhere classified (R26.2);Hemiplegia and hemiparesis Hemiplegia - Right/Left: Right Hemiplegia - dominant/non-dominant: Dominant Hemiplegia - caused by: Cerebral infarction    Time: 1407-1430 PT Time Calculation (min) (ACUTE ONLY):  23 min   Charges:   PT Evaluation $PT Eval Low Complexity: 1 Low PT Treatments $Gait Training: 8-22 mins       Hendricks LimesEmily My Rinke, PT 06/09/18, 2:54 PM [phone]

## 2018-06-09 NOTE — Progress Notes (Signed)
PT Cancellation Note  Patient Details Name: Rachel [name] MRN: [id number] DOB: 22-Feb-1955   Cancelled Treatment:    Reason Eval/Treat Not Completed: Other (comment).  Upon PT entry, pt soundly sleeping and did not wake to vc's so therapist let pt sleep and will re-attempt PT evaluation later today (nurse notified).  Hendricks Limes, PT 06/09/18, 9:46 AM [phone]

## 2018-06-09 NOTE — Progress Notes (Signed)
Patient was found on the safety mat sitting in an upright position in urine. Call bell was in place, bed alarm was on, patient was on a low bed, and it was in its lowest position. Pt was toileted less than 1.5 hours ago. Pt denies hitting her head and states she is not in pain. NP, Janeann Merl and [facility] notified as well as Husband Km Weatherbee due to the fall. VS stable. Orientation has not changed based off earlier assessment. NP Seals states to continue Q4 neuro checks per stroke protocol. Patient placed back in bed with 2 assist and repositioned for comfort. Pt instructed to use the call bell, bed alarm on. Will continue to monitor.

## 2018-06-09 NOTE — Consult Note (Signed)
[facility] Face-to-Face Psychiatry Consult   Reason for Consult: Hallucinations Referring Physician: Dr. Imogene Burn Patient Identification: Rachel [name] MRN:  [id number] Principal Diagnosis: Acute cerebral infarction [facility]) Diagnosis:  Principal Problem:   Acute cerebral infarction [facility]) Active Problems:   Bipolar 2 disorder, major depressive episode (HCC)   CVA (cerebral vascular accident) [facility])  Patient seen, chart is reviewed, reviewed past history from discharge summary inpatient psychiatry, collateral obtained from patient's husband. Total Time spent with patient: 35 min  Subjective: "I think I can go home tomorrow."  HPI: Rachel [name] is a 63 y.o. female patient with a known history of hypertension and hepatitis C who presented to the emergency room this morning with complaints of slurred speech and facial droop.  Patient denied having any focal weakness.  Patient lives with her husband at home.  Work of this morning was at baseline.  At about 8 AM patient started having slurred speech and facial droop and was transferred to the emergency room.   Patient was evaluated immediately by neurologist with NIHSS score of 4.  Imaging studies of the head with CT scan without contrast was negative.  TPA was administered by neurologist and patient subsequently admitted to the ICU for closer monitoring. Neurologist consulted hospitalist service to officially admit patient and she will continue to follow-up with patient for ongoing management.    Psychiatry consult is requested for concerns of hallucinations in patient with known psychiatric history.  Past Psychiatric History: Bipolar 2 disorder and substance abuse  Risk to Self:  Denies Risk to Others:  Denies Prior Inpatient Therapy:  Yes, last inpatient psychiatric admission at [facility] April 2018 with a substance-induced psychotic disorder. Prior Outpatient Therapy:  Patient does not follow with outpatient psychiatrist.  She states she has not continued  medication after discharge from years ago.  On evaluation, patient is laying in bed, mildly sedated.  Patient is cooperative and answers questions appropriately.  Nurses report that patient had a fall yesterday.  In reviewing records patient did receive a one-time dose of Zyprexa 10 mg in the afternoon for agitation, and then her usual bedtime dose of Zyprexa 20 mg at night.  Likely this has caused her excess sedation.  Patient is aware to ask for help.  Discussed with patient that I will be decreasing the dose.  I have contacted the patient's husband and informed him of the medication dose change.  Patient is still hopeful that patient can return home at discharge.  Patient has had no further behavioral issues.  She has had no hallucinations.  Patient is at her cognitive baseline with dementia. Patient is denying any suicidal or homicidal ideation, plan or intent.  Denying any auditory or visual hallucinations, and does not appear to be responding to any internal stimuli.   Collateral obtained from husband, Rachel [name]: Per husband, patient has been having worsening dementia over the past 3 years. She has been off of her psychiatric medicine for years which has caused her to have increased anxiety and OCD. Husband reports that he has been giving her Benadryl at night. He would like for patient to be able to come home with physical therapy.   Today, husband reports that patient has had weight loss over the past 2 years when she was at higher dose of Zyprexa.  He is agreeable with dose being decreased, and is aware to follow-up with outpatient provider for dose adjustments as needed after discharge.  Past Medical History:  Past Medical History:  Diagnosis Date  .  Hepatitis C   . Hypertension     Past Surgical History:  Procedure Laterality Date  . KNEE ARTHROSCOPY     Family History: No family history on file.   Family Psychiatric  History: Her father and her cousin committed  suicide.  Social History:  Social History   Substance and Sexual Activity  Alcohol Use Yes   Comment: rarely     Social History   Substance and Sexual Activity  Drug Use No    Social History   Socioeconomic History  . Marital status: Married    Spouse name: Not on file  . Number of children: Not on file  . Years of education: Not on file  . Highest education level: Not on file  Occupational History  . Not on file  Social Needs  . Financial resource strain: Not on file  . Food insecurity:    Worry: Not on file    Inability: Not on file  . Transportation needs:    Medical: Not on file    Non-medical: Not on file  Tobacco Use  . Smoking status: Current Every Day Smoker    Packs/day: 1.00    Types: Cigarettes  . Smokeless tobacco: Never Used  Substance and Sexual Activity  . Alcohol use: Yes    Comment: rarely  . Drug use: No  . Sexual activity: Not on file  Lifestyle  . Physical activity:    Days per week: Not on file    Minutes per session: Not on file  . Stress: Not on file  Relationships  . Social connections:    Talks on phone: Not on file    Gets together: Not on file    Attends religious service: Not on file    Active member of club or organization: Not on file    Attends meetings of clubs or organizations: Not on file    Relationship status: Not on file  Other Topics Concern  . Not on file  Social History Narrative  . Not on file   Additional Social History:  Lives with husband.  Allergies:   Allergies  Allergen Reactions  . Tetracycline Hives  . Penicillins Rash  . Tetracyclines & Related Rash    Labs:  Results for orders placed or performed during the hospital encounter of 06/07/18 (from the past 48 hour(s))  Urine Drug Screen, Qualitative     Status: Abnormal   Collection Time: 06/07/18  6:42 PM  Result Value Ref Range   Tricyclic, Ur Screen NONE DETECTED NONE DETECTED   Amphetamines, Ur Screen NONE DETECTED NONE DETECTED   MDMA  (Ecstasy)Ur Screen NONE DETECTED NONE DETECTED   Cocaine Metabolite,Ur [place] NONE DETECTED NONE DETECTED   Opiate, Ur Screen NONE DETECTED NONE DETECTED   Phencyclidine (PCP) Ur S NONE DETECTED NONE DETECTED   Cannabinoid 50 Ng, Ur [place] POSITIVE (A) NONE DETECTED   Barbiturates, Ur Screen NONE DETECTED NONE DETECTED   Benzodiazepine, Ur Scrn POSITIVE (A) NONE DETECTED   Methadone Scn, Ur NONE DETECTED NONE DETECTED    Comment: (NOTE) Tricyclics + metabolites, urine    Cutoff 1000 ng/mL Amphetamines + metabolites, urine  Cutoff 1000 ng/mL MDMA (Ecstasy), urine              Cutoff 500 ng/mL Cocaine Metabolite, urine          Cutoff 300 ng/mL Opiate + metabolites, urine        Cutoff 300 ng/mL Phencyclidine (PCP), urine  Cutoff 25 ng/mL Cannabinoid, urine                 Cutoff 50 ng/mL Barbiturates + metabolites, urine  Cutoff 200 ng/mL Benzodiazepine, urine              Cutoff 200 ng/mL Methadone, urine                   Cutoff 300 ng/mL The urine drug screen provides only a preliminary, unconfirmed analytical test result and should not be used for non-medical purposes. Clinical consideration and professional judgment should be applied to any positive drug screen result due to possible interfering substances. A more specific alternate chemical method must be used in order to obtain a confirmed analytical result. Gas chromatography / mass spectrometry (GC/MS) is the preferred confirmat ory method. Performed at [facility], [address] [place], [place] [address]   Urinalysis, Routine w reflex microscopic     Status: Abnormal   Collection Time: 06/07/18  6:42 PM  Result Value Ref Range   Color, Urine COLORLESS (A) YELLOW   APPearance CLEAR (A) CLEAR   Specific Gravity, Urine 1.003 (L) 1.005 - 1.030   pH 6.0 5.0 - 8.0   Glucose, UA NEGATIVE NEGATIVE mg/dL   Hgb urine dipstick SMALL (A) NEGATIVE   Bilirubin Urine NEGATIVE NEGATIVE   Ketones, ur NEGATIVE NEGATIVE  mg/dL   Protein, ur NEGATIVE NEGATIVE mg/dL   Nitrite NEGATIVE NEGATIVE   Leukocytes,Ua NEGATIVE NEGATIVE    Comment: Performed at [facility], [address] [place], [place] [address]  HIV antibody (Routine Testing)     Status: None   Collection Time: 06/08/18  4:51 AM  Result Value Ref Range   HIV Screen 4th Generation wRfx Non Reactive Non Reactive    Comment: (NOTE) Performed At: [facility] [address] [place], [place] [id number] Jolene Schimke MD [id number]   Hemoglobin A1c     Status: None   Collection Time: 06/08/18  4:51 AM  Result Value Ref Range   Hgb A1c MFr Bld 5.5 4.8 - 5.6 %    Comment: (NOTE) Pre diabetes:          5.7%-6.4% Diabetes:              >6.4% Glycemic control for   <7.0% adults with diabetes    Mean Plasma Glucose 111.15 mg/dL    Comment: Performed at [facility], [address], [place], [place] [address]  Lipid panel     Status: Abnormal   Collection Time: 06/08/18  4:51 AM  Result Value Ref Range   Cholesterol 236 (H) 0 - 200 mg/dL   Triglycerides 97 <629 mg/dL   HDL 55 >52 mg/dL   Total CHOL/HDL Ratio 4.3 RATIO   VLDL 19 0 - 40 mg/dL   LDL Cholesterol 841 (H) 0 - 99 mg/dL    Comment:        Total Cholesterol/HDL:CHD Risk Coronary Heart Disease Risk Table                     Men   Women  1/2 Average Risk   3.4   3.3  Average Risk       5.0   4.4  2 X Average Risk   9.6   7.1  3 X Average Risk  23.4   11.0        Use the calculated Patient Ratio above and the CHD Risk Table to  determine the patient's CHD Risk.        ATP III CLASSIFICATION (LDL):  <100     mg/dL   Optimal  [id number]  mg/dL   Near or Above                    Optimal  130-159  mg/dL   Borderline  [id number]  mg/dL   High  >956>190     mg/dL   Very High Performed at [facility], [address] [place], [place] [address]   Basic metabolic panel     Status: Abnormal   Collection Time: 06/08/18  4:51 AM  Result Value Ref Range    Sodium 137 135 - 145 mmol/L   Potassium 3.5 3.5 - 5.1 mmol/L   Chloride 108 98 - 111 mmol/L   CO2 19 (L) 22 - 32 mmol/L   Glucose, Bld 121 (H) 70 - 99 mg/dL   BUN 14 8 - 23 mg/dL   Creatinine, Ser 6.570.67 0.44 - 1.00 mg/dL   Calcium 8.6 (L) 8.9 - 10.3 mg/dL   GFR calc non Af Amer >60 >60 mL/min   GFR calc Af Amer >60 >60 mL/min   Anion gap 10 5 - 15    Comment: Performed at [facility], [address] [place], [place] [address]  CBC     Status: Abnormal   Collection Time: 06/08/18  4:51 AM  Result Value Ref Range   WBC 9.4 4.0 - 10.5 K/uL   RBC 5.15 (H) 3.87 - 5.11 MIL/uL   Hemoglobin 14.4 12.0 - 15.0 g/dL   HCT 29.543.7 28.436.0 - 13.246.0 %   MCV 84.9 80.0 - 100.0 fL   MCH 28.0 26.0 - 34.0 pg   MCHC 33.0 30.0 - 36.0 g/dL   RDW 44.013.4 10.211.5 - 72.515.5 %   Platelets 314 150 - 400 K/uL   nRBC 0.0 0.0 - 0.2 %    Comment: Performed at [facility], [address] [place], [place] [address]  Magnesium     Status: None   Collection Time: 06/08/18  4:51 AM  Result Value Ref Range   Magnesium 2.2 1.7 - 2.4 mg/dL    Comment: Performed at [facility], [address] [place], [place] [address]  Carbamazepine level, total     Status: Abnormal   Collection Time: 06/08/18  4:26 PM  Result Value Ref Range   Carbamazepine Lvl <2.0 (L) 4.0 - 12.0 ug/mL    Comment: Performed at [facility], [address] [place], [place] [address]    Current Facility-Administered Medications  Medication Dose Route Frequency Provider Last Rate Last Dose  . acetaminophen (TYLENOL) tablet 650 mg  650 mg Oral Q4H PRN Thana Farreynolds, Leslie, MD       Or  . acetaminophen (TYLENOL) solution 650 mg  650 mg Per Tube Q4H PRN Thana Farreynolds, Leslie, MD       Or  . acetaminophen (TYLENOL) suppository 650 mg  650 mg Rectal Q4H PRN Thana Farreynolds, Leslie, MD      . ALPRAZolam Prudy Feeler(XANAX) tablet 0.5 mg  0.5 mg Oral BID PRN Harlon DittyKeene, Jeremiah D, NP   0.5 mg at 06/08/18 1440  . aspirin EC tablet 81 mg  81 mg Oral Daily  Thana Farreynolds, Leslie, MD   81 mg at 06/09/18 1120  . atorvastatin (LIPITOR) tablet 40 mg  40 mg Oral q1800 Shaune Pollackhen, Qing, MD      . clopidogrel (PLAVIX) tablet 75 mg  75 mg  Oral Daily Thana Farr, MD   75 mg at 06/09/18 1120  . hydrALAZINE (APRESOLINE) injection 10 mg  10 mg Intravenous Q4H PRN Judithe Modest, NP   10 mg at 06/08/18 2142  . hydrochlorothiazide (HYDRODIURIL) tablet 25 mg  25 mg Oral Daily Ojie, Jude, MD   25 mg at 06/09/18 1121  . LORazepam (ATIVAN) injection 1 mg  1 mg Intravenous Q6H PRN Shaune Pollack, MD   1 mg at 06/08/18 1433  . nicotine (NICODERM CQ - dosed in mg/24 hours) patch 14 mg  14 mg Transdermal Daily Ojie, Jude, MD   14 mg at 06/09/18 1120  . nicotine polacrilex (NICORETTE) gum 2 mg  2 mg Oral PRN Judithe Modest, NP      . OLANZapine zydis (ZYPREXA) disintegrating tablet 20 mg  20 mg Oral QHS Mariel Craft, MD   20 mg at 06/08/18 2120  . pantoprazole (PROTONIX) EC tablet 40 mg  40 mg Oral QHS Bertram Savin, RPH   40 mg at 06/08/18 2119  . senna-docusate (Senokot-S) tablet 1 tablet  1 tablet Oral QHS PRN Thana Farr, MD        Musculoskeletal: Strength & Muscle Tone: decreased Gait & Station: unsteady Patient leans: N/A  Psychiatric Specialty Exam: Physical Exam  Nursing note and vitals reviewed. Constitutional: She appears well-developed and well-nourished. No distress.  HENT:  Head: Normocephalic and atraumatic.  Eyes: EOM are normal.  Neck: Normal range of motion.  Cardiovascular: Normal rate and regular rhythm.  Respiratory: Effort normal.  Neurological: She is alert.    Review of Systems  Psychiatric/Behavioral: Positive for memory loss. Negative for depression, hallucinations, substance abuse and suicidal ideas. The patient has insomnia. The patient is not nervous/anxious.   All other systems reviewed and are negative.   Blood pressure (!) 157/92, pulse 89, temperature 98.2 F (36.8 C), resp. rate 16, height  (1.702 m), weight  57.6 kg, SpO2 96 %.Body mass index is 19.89 kg/m.  General Appearance: Casual  Eye Contact:  Good  Speech:  Garbled  Volume:  Normal  Mood:  Dysphoric  Affect:  Congruent  Thought Process:  Goal Directed  Orientation:  Full (Time, Place, and Person)  Thought Content:  Hallucinations: None  Suicidal Thoughts:  No  Homicidal Thoughts:  No  Memory:  fair  Judgement:  Fair  Insight:  Fair  Psychomotor Activity:  Normal  Concentration:  Attention Span: Good  Recall:  Good  Fund of Knowledge:  Good  Language:  Good  Akathisia:  No  Handed:  Right  AIMS (if indicated):     Assets:  Architect Housing Resilience Social Support  ADL's:  Intact  Cognition:  Impaired,  Mild  Sleep:   decreased     Treatment Plan Summary: Daily contact with patient to assess and evaluate symptoms and progress in treatment, Medication management and one dose zyprexa 10 mg and tegretol 200 mg. Decrease Zyprexa Zydis to 5 mg at bedtime. No indication for Tegretol to be continued at this time.  Disposition: No evidence of imminent risk to self or others at present.   Patient does not meet criteria for psychiatric inpatient admission. Supportive therapy provided about ongoing stressors. Discussed crisis plan, support from social network, calling 911, coming to the Emergency Department, and calling Suicide Hotline.   After medically clear, patient may be discharged to husband who agrees to maintain patient's safety and psychiatric follow-up.  Mariel Craft, MD 06/09/2018 1:55 PM

## 2018-06-10 ENCOUNTER — Inpatient Hospital Stay: Payer: Medicare HMO

## 2018-06-10 LAB — BASIC METABOLIC PANEL
Anion gap: 12 (ref 5–15)
BUN: 26 mg/dL — ABNORMAL HIGH (ref 8–23)
CO2: 20 mmol/L — ABNORMAL LOW (ref 22–32)
Calcium: 8.7 mg/dL — ABNORMAL LOW (ref 8.9–10.3)
Chloride: 107 mmol/L (ref 98–111)
Creatinine, Ser: 0.89 mg/dL (ref 0.44–1.00)
GFR calc Af Amer: >60 mL/min (ref >60)
GFR calc non Af Amer: >60 mL/min (ref >60)
Glucose, Bld: 123 mg/dL — ABNORMAL HIGH (ref 70–99)
Potassium: 2.8 mmol/L — ABNORMAL LOW (ref 3.5–5.1)
Sodium: 139 mmol/L (ref 135–145)

## 2018-06-10 LAB — CBC
HCT: 44.4 % (ref 36.0–46.0)
Hemoglobin: 14.7 g/dL (ref 12.0–15.0)
MCH: 28.2 pg (ref 26.0–34.0)
MCHC: 33.1 g/dL (ref 30.0–36.0)
MCV: 85.2 fL (ref 80.0–100.0)
Platelets: 367 10*3/uL (ref 150–400)
RBC: 5.21 MIL/uL — ABNORMAL HIGH (ref 3.87–5.11)
RDW: 13.8 % (ref 11.5–15.5)
WBC: 10.3 10*3/uL (ref 4.0–10.5)
nRBC: 0 % (ref 0.0–0.2)

## 2018-06-10 LAB — POTASSIUM: Potassium: 4 mmol/L (ref 3.5–5.1)

## 2018-06-10 LAB — MAGNESIUM: Magnesium: 2.3 mg/dL (ref 1.7–2.4)

## 2018-06-10 MED ORDER — POTASSIUM CHLORIDE 10 MEQ/100ML IV SOLN
10.0000 meq | INTRAVENOUS | Status: DC
  Filled 2018-06-10: qty 100

## 2018-06-10 MED ORDER — POTASSIUM CHLORIDE CRYS ER 20 MEQ PO TBCR
40.0000 meq | EXTENDED_RELEASE_TABLET | Freq: Once | ORAL | Status: AC
  Administered 2018-06-10: 13:00:00 40 meq via ORAL
  Filled 2018-06-10: qty 2

## 2018-06-10 MED ORDER — ATORVASTATIN CALCIUM 40 MG PO TABS: 40.0000 mg | ORAL_TABLET | Freq: Every day | ORAL | 0 refills | Status: DC

## 2018-06-10 MED ORDER — ENSURE ENLIVE PO LIQD: 237.0000 mL | Freq: Two times a day (BID) | ORAL | 0 refills | Status: DC

## 2018-06-10 MED ORDER — POTASSIUM CHLORIDE 20 MEQ PO PACK
40.0000 meq | PACK | Freq: Once | ORAL | Status: AC
  Administered 2018-06-10: 10:00:00 40 meq via ORAL
  Filled 2018-06-10: qty 2

## 2018-06-10 MED ORDER — LABETALOL HCL 5 MG/ML IV SOLN: 20.0000 mg | INTRAVENOUS | Status: DC | PRN

## 2018-06-10 MED ORDER — OLANZAPINE 5 MG PO TBDP: 5.0000 mg | ORAL_TABLET | Freq: Every day | ORAL | 0 refills | Status: DC

## 2018-06-10 MED ORDER — CLOPIDOGREL BISULFATE 75 MG PO TABS: 75.0000 mg | ORAL_TABLET | Freq: Every day | ORAL | 0 refills | Status: DC

## 2018-06-10 NOTE — Progress Notes (Signed)
Physical Therapy Treatment Patient Details Name: Rachel [name] MRN: [id number] DOB: May 14, 1955 Today's Date: 06/10/2018    History of Present Illness Pt is a 63 y.o. female with a known history of hypertension and hepatitis C who presented to the emergency the morning of 5/5 with complaints of slurred speech, facial droop, and off balance walking.  MRI (+) for acute 1 cm infarct, likely LEFT anterior choroidal artery territory, affecting posterior limb internal capsule and lateral thalamus, possible medial lentiform nucleus. This is nonhemorrhagic. Atrophy and small vessel disease.  Repeat CT of head (d/t s/p TPA) negative for hemorrhage.    PT Comments    MD Ojie cleared PT to see pt 1 hour after receiving potassium supplementation.  Pt perseverating on wanting a cigarette during session.   Pt initially appearing steady standing at sink reaching within BOS but then pt took a step backwards suddenly and lost balance (posterior R) requiring mod assist to prevent fall.  Pt was able to reach outside BOS to L side in standing (CGA for safety) with R UE support.  During ambulation (2nd assist present for safety) pt noted with decreased stance time R LE with mild increased R knee flexion (and subsequent mild R lateral lean) with increased distance ambulating (requiring CGA and then progressing to min assist for balance).  Pt taking R or L hand off walker at times requiring cueing for safety; with increased distance ambulating pt requiring cueing and min assist at times to manage RW safely (and cues to stay closer to RW); pt required intermittent cueing for safe R UE placement on walker.  Pt appearing stronger and progressing with functional mobility (compared to yesterday) but is still impulsive with decreased safety awareness and appears to be at high risk for falls.  Continue to recommend STR (nurse notified of this and pt's mobility/assist needs during session).  If pt chooses to discharge home, pt will  require 24/7 physical assist for all functional mobility for safety.   Follow Up Recommendations  SNF     Equipment Recommendations  Rolling walker with 5" wheels    Recommendations for Other Services OT consult     Precautions / Restrictions Precautions Precautions: Fall Restrictions Weight Bearing Restrictions: No    Mobility  Bed Mobility Overal bed mobility: Needs Assistance Bed Mobility: Supine to Sit     Supine to sit: Supervision;HOB elevated Sit to supine: Supervision;HOB elevated   General bed mobility comments: SBA for safety (pt impulsive)  Transfers Overall transfer level: Needs assistance Equipment used: Rolling walker (2 wheeled) Transfers: Sit to/from Stand Sit to Stand: Min guard         General transfer comment: CGA (for safety) to stand from bed x2 trials with RW  Ambulation/Gait Ambulation/Gait assistance: Min guard;Min assist;+2 physical assistance;+2 safety/equipment Gait Distance (Feet): 200 Feet Assistive device: Rolling walker (2 wheeled)   Gait velocity: mildly decreased   General Gait Details: decreased stance time R LE with mild increased R knee flexion (and subsequent mild R lateral lean) with increased distance ambulating (requiring CGA and then progressing to min assist for balance); decreased R LE heelstrike/step length/foot clearance; pt taking R or L hand off walker at times requiring cueing for safety; with increased distance ambulating pt requiring cueing and min assist at times to manage RW safely (and cues to stay closer to RW); pt required intermittent cueing for safe R UE placement on walker   Stairs  Wheelchair Mobility    Modified Rankin (Stroke Patients Only)       Balance Overall balance assessment: Needs assistance Sitting-balance support: No upper extremity supported;Feet supported Sitting balance-Leahy Scale: Good Sitting balance - Comments: steady static sitting reaching within BOS (although  appearing impulsive/quick with movements)   Standing balance support: No upper extremity supported;During functional activity Standing balance-Leahy Scale: Poor Standing balance comment: pt initially appearing steady standing at sink reaching within BOS but then pt took a step backwards suddenly and lost balance (posterior R) requiring mod assist to prevent fall; able to reach outside BOS to L side (CGA for safety) with R UE support                            Cognition Arousal/Alertness: Awake/alert Behavior During Therapy: Impulsive;Flat affect;Restless Overall Cognitive Status: No family/caregiver present to determine baseline cognitive functioning                                 General Comments: Oriented to person and place; perseverating on wanting to have a cigarette      Exercises      General Comments   Nursing cleared pt for participation in physical therapy.  Pt agreeable to PT session.      Pertinent Vitals/Pain Pain Assessment: No/denies pain Pain Intervention(s): Limited activity within patient's tolerance;Monitored during session;Repositioned    Home Living                      Prior Function            PT Goals (current goals can now be found in the care plan section) Acute Rehab PT Goals Patient Stated Goal: to go home; improve walking PT Goal Formulation: With patient Time For Goal Achievement: 06/23/18 Potential to Achieve Goals: Fair Progress towards PT goals: Progressing toward goals    Frequency    7X/week      PT Plan Current plan remains appropriate    Co-evaluation              AM-PAC PT "6 Clicks" Mobility   Outcome Measure  Help needed turning from your back to your side while in a flat bed without using bedrails?: None Help needed moving from lying on your back to sitting on the side of a flat bed without using bedrails?: A Little Help needed moving to and from a bed to a chair (including a  wheelchair)?: A Little Help needed standing up from a chair using your arms (e.g., wheelchair or bedside chair)?: A Little Help needed to walk in hospital room?: A Little Help needed climbing 3-5 steps with a railing? : A Lot 6 Click Score: 18    End of Session Equipment Utilized During Treatment: Gait belt Activity Tolerance: Patient tolerated treatment well Patient left: in bed;with call bell/phone within reach;with bed alarm set;Other (comment)(bed in lowest position; B fall mats in place) Nurse Communication: Mobility status;Precautions PT Visit Diagnosis: Other abnormalities of gait and mobility (R26.89);Muscle weakness (generalized) (M62.81);History of falling (Z91.81);Difficulty in walking, not elsewhere classified (R26.2);Hemiplegia and hemiparesis Hemiplegia - Right/Left: Right Hemiplegia - dominant/non-dominant: Dominant Hemiplegia - caused by: Cerebral infarction     Time: [id number] PT Time Calculation (min) (ACUTE ONLY): 23 min  Charges:  $Gait Training: 8-22 mins $Therapeutic Activity: 8-22 mins  Hendricks Limes, PT 06/10/18, 12:48 PM [phone]

## 2018-06-10 NOTE — Progress Notes (Signed)
PT Cancellation Note  Patient Details Name: Rachel [name] MRN: [id number] DOB: 11/25/1955   Cancelled Treatment:    Reason Eval/Treat Not Completed: Medical issues which prohibited therapy.  Chart reviewed.  Pt's potassium noted to be down-trending to 2.8 and not noted to be on telemetry.  Per PT guidelines for low potassium, will hold PT at this time and re-attempt PT treatment session at a later date/time as medically appropriate.  Hendricks Limes, PT 06/10/18, 8:24 AM [phone]

## 2018-06-10 NOTE — Progress Notes (Signed)
Speech Language Pathology Treatment: Dysphagia  Patient Details Name: Rachel [name] MRN: [id number] DOB: 1955-04-19 Today's Date: 06/10/2018 Time: 1350-1420 SLP Time Calculation (min) (ACUTE ONLY): 30 min  Assessment / Plan / Recommendation Clinical Impression  Pt seen for ongoing assessment of swallowing and toleration of oral diet. Diet consistency was downgraded yesterday (solids) to a dysphagia level 2(MINCED foods) for easier mastication and aid for oral phase bolus management - pt has R lingual/labial decreased tone/ROM/strength d/t new CVA. This also impacts speech as evident by mild+ Dysarthria. Pt is somewhat impulsive and ready to "go home now". Education attempted on aspiration precautions and diet consistency in room.  Pt consumed few trials of thin liquids while practicing the aspiration precautions as recommended by SLP - verbal cues and handouts provided. No immediate, overt s/s of aspiration noted w/ the thin liquid trials. Pt did not repeat back any of the aspiration precautions consistently but verbalized agreement w/ taking small sips of liquids more slowly to "not get strangled" w/ verbal cues during session. Handouts on the aspiration precautions and food consistency (MINCED) recommended were provided for d/c home; Husband. Noted pt has Memory deficits at baseline per chart H&P.  Recommend f/u w/ skilled ST services at discharge to further address both Dysphagia/education and Dysarthria; education for Husband and family as well. Recommend continue w/ currently recommended Dysphagia level 2 diet w/ thin liquids w/ aspiration precautions; Pills in Puree for easier, safer swallowing at this time. Pt may benefit from further formal assessment of Cognitive-linguistic abilities to better address needs/safety in ADLs. NSG updated on information and recommendations provided; Handouts in pt's packet.     HPI HPI: Pt is a 63 y.o. female with a known history of Bipolar 2 disorder w/ major  depressive episode, hypertension and hepatitis C who presented to the emergency room this morning with complaints of slurred speech and facial droop.  Patient denied having any focal weakness.  Patient lives with her husband at home.  Work of this morning was at baseline.  At about 8 AM patient started having slurred speech and facial droop and was transferred to the emergency room.   Patient was evaluated immediately by neurologist with NIHSS score of 4.  Imaging studies of the head with CT scan without contrast was negative.  TPA was administered by neurologist and patient subsequently admitted to the ICU for closer monitoring.  Neurology consulted, following.  At baseline at home, Patient has a mild facial droop at baseline but per report of husband this is worse today.  Speech is normal at baseline.  Patient has some memory difficulties at baseline.  Is able to dress herself, feed herself and is not incontinent.  Per ST Cognitive-linguistic evaluation performed, patient's speech is intelligible when cued to speak louder and slower.  The patient does not appear to be aware of changes in her speech; nor, does she appear aware of overall deficits to inclue weakness in RUE when attempting to feed herself.  NSG concerned today of pt's increased mastication effort w/ diet; swallowing in general.       SLP Plan  Continue with current plan of care       Recommendations  Diet recommendations: Dysphagia 2 (fine chop);Thin liquid Liquids provided via: Cup(monitor any straw use carefully) Medication Administration: Whole meds with puree(for easier, safer swallowing) Supervision: Patient able to self feed;Staff to assist with self feeding;Intermittent supervision to cue for compensatory strategies Compensations: Minimize environmental distractions;Slow rate;Small sips/bites;Lingual sweep for clearance of pocketing;Multiple dry swallows  after each bite/sip;Follow solids with liquid(check R side of mouth for oral  clearing w/ bites) Postural Changes and/or Swallow Maneuvers: Seated upright 90 degrees;Upright 30-60 min after meal                General recommendations: (Dietician f/u) Oral Care Recommendations: Oral care BID;Staff/trained caregiver to provide oral care Follow up Recommendations: Skilled Nursing facility(pt is declining and wants to go home per CM) SLP Visit Diagnosis: Dysphagia, oropharyngeal phase (R13.12) Plan: Continue with current plan of care       GO                [name], MS, CCC-SLP [name],[name] 06/10/2018, 2:15 PM

## 2018-06-10 NOTE — Care Management Important Message (Signed)
Important Message  Patient Details  Name: Rachel [name] MRN: [id number] Date of Birth: 06-24-1955   Medicare Important Message Given:  Yes    [name] 06/10/2018, 3:04 PM

## 2018-06-10 NOTE — Progress Notes (Signed)
Pt being discharged home with home health, discharge instructions reviewed with pt, states understanding, informed to allow husband to review discharge instructions and if they have any questions to call 1C, pt with no complaints at discharge

## 2018-06-10 NOTE — Progress Notes (Signed)
Subjective: Patient awake today.  Remains dysarthric.  Per nursing with increased right sided weakness  Objective: Current vital signs: BP 111/67   Pulse 82   Temp 98.1 F (36.7 C) (Oral)   Resp 16   Ht  (1.702 m)   Wt 57.6 kg   SpO2 93%   BMI 19.89 kg/m  Vital signs in last 24 hours: Temp:  [97.3 F (36.3 C)-98.2 F (36.8 C)] 98.1 F (36.7 C) (05/08 0846) Pulse Rate:  [72-96] 82 (05/08 0846) Resp:  [16-18] 16 (05/08 0554) BP: (111-182)/(67-127) 111/67 (05/08 0846) SpO2:  [93 %-97 %] 93 % (05/08 0846)  Intake/Output from previous day: 05/07 0701 - 05/08 0700 In: 120 [P.O.:120] Out: -  Intake/Output this shift: No intake/output data recorded. Nutritional status:  Diet Order            DIET DYS 2 Room service appropriate? Yes with Assist; Fluid consistency: Thin  Diet effective now              Neurologic Exam: Mental Status: Alert.  Dysarthric.  Able to follow commands. Cranial Nerves: II: Blinks to bilateral confrontation III,IV, VI: ptosis not present, extra-ocular motions intact bilaterally V,VII: right facial droop VIII: hearing normal bilaterally IX,X: gag reflex present XI: bilateral shoulder shrug XII: midline tongue extension Motor: Gives 5/5 left sided strength.  Unable to lift right arm as high as left.  4-/5 RLE strength   Lab Results: Basic Metabolic Panel: Recent Labs  Lab 06/07/18 1006 06/08/18 0451 06/10/18 0325  NA 142 137 139  K 3.7 3.5 2.8*  CL 105 108 107  CO2 28 19* 20*  GLUCOSE 108* 121* 123*  BUN 20 14 26*  CREATININE 0.89 0.67 0.89  CALCIUM 8.7* 8.6* 8.7*  MG  --  2.2 2.3    Liver Function Tests: Recent Labs  Lab 06/07/18 1006  AST 14*  ALT 14  ALKPHOS 108  BILITOT 0.3  PROT 6.9  ALBUMIN 3.7   No results for input(s): LIPASE, AMYLASE in the last 168 hours. No results for input(s): AMMONIA in the last 168 hours.  CBC: Recent Labs  Lab 06/07/18 1006 06/08/18 0451 06/10/18 0325  WBC 8.8 9.4 10.3   NEUTROABS 6.5  --   --   HGB 13.6 14.4 14.7  HCT 42.2 43.7 44.4  MCV 87.2 84.9 85.2  PLT 308 314 367    Cardiac Enzymes: No results for input(s): CKTOTAL, CKMB, CKMBINDEX, TROPONINI in the last 168 hours.  Lipid Panel: Recent Labs  Lab 06/08/18 0451  CHOL 236*  TRIG 97  HDL 55  CHOLHDL 4.3  VLDL 19  LDLCALC 161*    CBG: Recent Labs  Lab 06/07/18 1016 06/07/18 1310  GLUCAP 104* 116*    Microbiology: Results for orders placed or performed during the hospital encounter of 06/07/18  SARS Coronavirus 2 (CEPHEID - Performed in [facility] hospital lab), Hosp Order     Status: None   Collection Time: 06/07/18 11:57 AM  Result Value Ref Range Status   SARS Coronavirus 2 NEGATIVE NEGATIVE Final    Comment: (NOTE) If result is NEGATIVE SARS-CoV-2 target nucleic acids are NOT DETECTED. The SARS-CoV-2 RNA is generally detectable in upper and lower  respiratory specimens during the acute phase of infection. The lowest  concentration of SARS-CoV-2 viral copies this assay can detect is 250  copies / mL. A negative result does not preclude SARS-CoV-2 infection  and should not be used as the sole basis for treatment or  other  patient management decisions.  A negative result may occur with  improper specimen collection / handling, submission of specimen other  than nasopharyngeal swab, presence of viral mutation(s) within the  areas targeted by this assay, and inadequate number of viral copies  (<250 copies / mL). A negative result must be combined with clinical  observations, patient history, and epidemiological information. If result is POSITIVE SARS-CoV-2 target nucleic acids are DETECTED. The SARS-CoV-2 RNA is generally detectable in upper and lower  respiratory specimens dur ing the acute phase of infection.  Positive  results are indicative of active infection with SARS-CoV-2.  Clinical  correlation with patient history and other diagnostic information is  necessary to  determine patient infection status.  Positive results do  not rule out bacterial infection or co-infection with other viruses. If result is PRESUMPTIVE POSTIVE SARS-CoV-2 nucleic acids MAY BE PRESENT.   A presumptive positive result was obtained on the submitted specimen  and confirmed on repeat testing.  While 2019 novel coronavirus  (SARS-CoV-2) nucleic acids may be present in the submitted sample  additional confirmatory testing may be necessary for epidemiological  and / or clinical management purposes  to differentiate between  SARS-CoV-2 and other Sarbecovirus currently known to infect humans.  If clinically indicated additional testing with an alternate test  methodology [phone]([id number]) is advised. The SARS-CoV-2 RNA is generally  detectable in upper and lower respiratory sp ecimens during the acute  phase of infection. The expected result is Negative. Fact Sheet for Patients:  BoilerBrush.com.cyhttps://www.fda.gov/media/136312/download Fact Sheet for Healthcare Providers: https://pope.com/https://www.fda.gov/media/136313/download This test is not yet approved or cleared by the [place] FDA and has been authorized for detection and/or diagnosis of SARS-CoV-2 by FDA under an Emergency Use Authorization (EUA).  This EUA will remain in effect (meaning this test can be used) for the duration of the COVID-19 declaration under Section 564(b)(1) of the Act, 21 U.S.C. section 360bbb-3(b)(1), unless the authorization is terminated or revoked sooner. Performed at [facility], [address] [place], [place] [address]   MRSA PCR Screening     Status: None   Collection Time: 06/07/18  1:16 PM  Result Value Ref Range Status   MRSA by PCR NEGATIVE NEGATIVE Final    Comment:        The GeneXpert MRSA Assay (FDA approved for NASAL specimens only), is one component of a comprehensive MRSA colonization surveillance program. It is not intended to diagnose MRSA infection nor to guide or monitor treatment  for MRSA infections. Performed at [facility], [address] [place], [place] [address]     Coagulation Studies: No results for input(s): LABPROT, INR in the last 72 hours.  Imaging: Ct Head Wo Contrast  Result Date: 06/08/2018 CLINICAL DATA:  Stroke follow-up. EXAM: CT HEAD WITHOUT CONTRAST TECHNIQUE: Contiguous axial images were obtained from the base of the skull through the vertex without intravenous contrast. COMPARISON:  MR brain and CT head from yesterday. FINDINGS: Brain: Small vague area of hypodensity in the region of the left posterior limb of the internal capsule, corresponding to the acute infarct seen on MRI. No evidence of hemorrhage, hydrocephalus, extra-axial collection or mass lesion/mass effect. Old bilateral thalamic and right cerebellar lacunar infarcts again noted. Unchanged mild atrophy. Vascular: Atherosclerotic vascular calcification of the carotid siphons. No hyperdense vessel. Skull: Normal. Negative for fracture or focal lesion. Sinuses/Orbits: No acute finding. Other: None. IMPRESSION: 1. Small vague area of hypodensity in the region of the left posterior limb of the  internal capsule, corresponding to the acute infarct seen on MRI. No hemorrhage. Electronically Signed   By: Obie Dredge M.D.   On: 06/08/2018 12:13    Medications:  I have reviewed the patient's current medications. Scheduled: . aspirin EC  81 mg Oral Daily  . atorvastatin  40 mg Oral q1800  . clopidogrel  75 mg Oral Daily  . feeding supplement (ENSURE ENLIVE)  237 mL Oral BID BM  . hydrochlorothiazide  25 mg Oral Daily  . nicotine  14 mg Transdermal Daily  . OLANZapine zydis  5 mg Oral QHS  . pantoprazole  40 mg Oral QHS    Assessment/Plan: 63 year old with acute infarct s/p tPA now on ASA and Plavix, who now exhibits right sided weakness.  Previously did not exhibit significant right sided weakness.  Remains uncooperative at times as well.    Recommendations: 1. Repeat head  CT   LOS: 3 days   Thana Farr, MD Neurology [phone] 06/10/2018  10:22 AM

## 2018-06-10 NOTE — Discharge Summary (Signed)
[facility] [place] [facility]   PATIENT NAME: Rachel [name]    [facility]  DATE OF BIRTH:  [birth year]  DATE OF ADMISSION:  06/07/2018   ADMITTING PHYSICIAN: Thana Farr, MD  DATE OF DISCHARGE: 06/10/2018  PRIMARY CARE PHYSICIAN: Patient, No Pcp Per   ADMISSION DIAGNOSIS:  Cerebrovascular accident (CVA), unspecified mechanism (HCC) [I63.9] DISCHARGE DIAGNOSIS:  Principal Problem:   Acute cerebral infarction [facility]) Active Problems:   Bipolar 2 disorder, major depressive episode (HCC)   CVA (cerebral vascular accident) (HCC)  SECONDARY DIAGNOSIS:   Past Medical History:  Diagnosis Date  . Hepatitis C   . Hypertension    HOSPITAL COURSE:  Chief complaint; facial droop and slurred speech   History of presenting complaint; Rachel [name]  is a 63 y.o. female with a known history of hypertension and hepatitis C who presented to the emergency room this morning with complaints of slurred speech and facial droop.  Patient denied having any focal weakness.  Patient lives with her husband at home.  Work of this morning was at baseline.  At about 8 AM patient started having slurred speech and facial droop and was transferred to the emergency room.   Patient was evaluated immediately by neurologist with NIHSS score of 4.  Imaging studies of the head with CT scan without contrast was negative.  TPA was administered by neurologist and patient subsequently admitted to the ICU for closer monitoring.  Please refer to the H&P dictated for further details    Hospital course; 1.Acute CVA Presented with slurred speech and facial droop.  Patient also later noted to have unsteady gait and leaning towards the right with ambulation. CT head without contrast was negative for any acute findings. Patient status post TPA and subsequently admitted to the ICU. MRI MRA of the brain without contrastreport left 1 cm infarctaffecting posterior limb internal capsule and  lateral thalamus, possible medial lentiform nucleus.  Echocardiogram documented to show no cardiac source of embolus with ejection fraction of 55 to 60%.  Repeat CT scan of the head with no evidence of hemorrhage following TPA administration.  This morning patient had another CT head without contrast done which was negative for any acute findings.  Patient seen by speech therapist and started on dysphagia 2 with thin liquid diet.  Refer to neurology note.Carotid Doppler ultrasound isunremarkable.  Patient evaluated by physical therapist and skilled nursing facility placement recommended.  Patient declined placement.  Husband also reported to decline rehab placement and wants to take patient home.  Attempted: Husband several times this morning but no response.  Left voice message.  Patient cleared for discharge by psychiatrist as well as neurologist.  Plan is to discharge home with home health with physical therapy, skilled nursing, OT, speech therapist. Patient reevaluated by neurologist yesterday and recommendation is for aspirin 81 mg p.o. daily with Plavix 75 mg p.o. daily for 1 month and then return to monotherapy with aspirin after 1 month.  Continue statins.  Appointment made to follow-up with new primary care physician with [facility] to make this changes.  2.Hypertension Allowedpermissive hypertension due to acute CVA. Resumed hydrochlorothiazide and blood pressure better controlled  3.Tobacco abuse Smoking cessationwascounseled for 3-4 minutes, onnicotine patch.  4.Documented history of hep C  Stable. Follow-up with her gastroenterologist as outpatient   5.Mild cognitive dysfunction Patient with episodes of confusion.  Seen by psychiatrist.  Patient does not meet criteria for psychiatric inpatient admission.  Patient was restarted on Zyprexa  20 mg p.o. nightly but patient noted to be very lethargic the next morning.  Reevaluated by psychiatrist and dose of Zyprexa  decreased to 5 mg p.o. nightly.  Psychiatrist recommended discharge on current dose of 5 mg.  No indication to resume Tegretol.  Husband also confirmed patient had not been compliant with the meds previously.  6. Hyperlipidemia. LDL 162,started Lipitor  DISCHARGE CONDITIONS:  Stable.  Discharge home with home health services. CONSULTS OBTAINED:  Treatment Team:  [facility], Neuro1, MD DRUG ALLERGIES:   Allergies  Allergen Reactions  . Tetracycline Hives  . Penicillins Rash  . Tetracyclines & Related Rash   DISCHARGE MEDICATIONS:   Allergies as of 06/10/2018      Reactions   Tetracycline Hives   Penicillins Rash   Tetracyclines & Related Rash      Medication List    STOP taking these medications   carbamazepine 200 MG tablet Commonly known as:  TEGRETOL   OLANZapine 20 MG tablet Commonly known as:  ZYPREXA Replaced by:  OLANZapine zydis 5 MG disintegrating tablet     TAKE these medications   aspirin 81 MG tablet Take 1 tablet (81 mg total) by mouth daily.   atorvastatin 40 MG tablet Commonly known as:  LIPITOR Take 1 tablet (40 mg total) by mouth daily at 6 PM.   clopidogrel 75 MG tablet Commonly known as:  PLAVIX Take 1 tablet (75 mg total) by mouth daily. Start taking on:  Jun 11, 2018   cyanocobalamin 1000 MCG tablet Take 1 tablet (1,000 mcg total) by mouth daily.   feeding supplement (ENSURE ENLIVE) Liqd Take 237 mLs by mouth 2 (two) times daily between meals.   hydrochlorothiazide 25 MG tablet Commonly known as:  HYDRODIURIL Take 1 tablet (25 mg total) by mouth daily.   OLANZapine zydis 5 MG disintegrating tablet Commonly known as:  ZYPREXA Take 1 tablet (5 mg total) by mouth at bedtime. Replaces:  OLANZapine 20 MG tablet        DISCHARGE INSTRUCTIONS:   DIET:  Dysphagia 2 with thin liquid diet consistency.  Heart healthy diet restriction. DISCHARGE CONDITION:  Stable ACTIVITY:  Activity as tolerated OXYGEN:  Home Oxygen: No.  Oxygen  Delivery: room air DISCHARGE LOCATION:  home   If you experience worsening of your admission symptoms, develop shortness of breath, life threatening emergency, suicidal or homicidal thoughts you must seek medical attention immediately by calling 911 or calling your MD immediately  if symptoms less severe.  You Must read complete instructions/literature along with all the possible adverse reactions/side effects for all the Medicines you take and that have been prescribed to you. Take any new Medicines after you have completely understood and accpet all the possible adverse reactions/side effects.   Please note  You were cared for by a hospitalist during your hospital stay. If you have any questions about your discharge medications or the care you received while you were in the hospital after you are discharged, you can call the unit and asked to speak with the hospitalist on call if the hospitalist that took care of you is not available. Once you are discharged, your primary care physician will handle any further medical issues. Please note that NO REFILLS for any discharge medications will be authorized once you are discharged, as it is imperative that you return to your primary care physician (or establish a relationship with a primary care physician if you do not have one) for your aftercare needs so that they can reassess  your need for medications and monitor your lab values.    On the day of Discharge:  VITAL SIGNS:  Blood pressure 111/67, pulse 82, temperature 98.1 F (36.7 C), temperature source Oral, resp. rate 16, height 5\' 7"  (1.702 m), weight 57.6 kg, SpO2 93 %. PHYSICAL EXAMINATION:  GENERAL:  63 y.o.-year-old patient lying in the bed with no acute distress.  EYES: Pupils equal, round, reactive to light and accommodation. No scleral icterus. Extraocular muscles intact.  HEENT: Head atraumatic, normocephalic. Oropharynx and nasopharynx clear.  NECK:  Supple, no jugular venous  distention. No thyroid enlargement, no tenderness.  LUNGS: Normal breath sounds bilaterally, no wheezing, rales,rhonchi or crepitation. No use of accessory muscles of respiration.  CARDIOVASCULAR: S1, S2 normal. No murmurs, rubs, or gallops.  ABDOMEN: Soft, non-tender, non-distended. Bowel sounds present. No organomegaly or mass.  EXTREMITIES: No pedal edema, cyanosis, or clubbing.  NEUROLOGIC: Cranial nerves II through XII are intact. Muscle strength 5/5 in all extremities. Sensation intact. Gait not checked.  PSYCHIATRIC: The patient is alert and oriented x 3.  SKIN: No obvious rash, lesion, or ulcer.  DATA REVIEW:   CBC Recent Labs  Lab 06/10/18 0325  WBC 10.3  HGB 14.7  HCT 44.4  PLT 367    Chemistries  Recent Labs  Lab 06/07/18 1006  06/10/18 0325  NA 142   < > 139  K 3.7   < > 2.8*  CL 105   < > 107  CO2 28   < > 20*  GLUCOSE 108*   < > 123*  BUN 20   < > 26*  CREATININE 0.89   < > 0.89  CALCIUM 8.7*   < > 8.7*  MG  --    < > 2.3  AST 14*  --   --   ALT 14  --   --   ALKPHOS 108  --   --   BILITOT 0.3  --   --    < > = values in this interval not displayed.     Microbiology Results  Results for orders placed or performed during the hospital encounter of 06/07/18  SARS Coronavirus 2 (CEPHEID - Performed in [facility] hospital lab), Hosp Order     Status: None   Collection Time: 06/07/18 11:57 AM  Result Value Ref Range Status   SARS Coronavirus 2 NEGATIVE NEGATIVE Final    Comment: (NOTE) If result is NEGATIVE SARS-CoV-2 target nucleic acids are NOT DETECTED. The SARS-CoV-2 RNA is generally detectable in upper and lower  respiratory specimens during the acute phase of infection. The lowest  concentration of SARS-CoV-2 viral copies this assay can detect is 250  copies / mL. A negative result does not preclude SARS-CoV-2 infection  and should not be used as the sole basis for treatment or other  patient management decisions.  A negative result may occur  with  improper specimen collection / handling, submission of specimen other  than nasopharyngeal swab, presence of viral mutation(s) within the  areas targeted by this assay, and inadequate number of viral copies  (<250 copies / mL). A negative result must be combined with clinical  observations, patient history, and epidemiological information. If result is POSITIVE SARS-CoV-2 target nucleic acids are DETECTED. The SARS-CoV-2 RNA is generally detectable in upper and lower  respiratory specimens dur ing the acute phase of infection.  Positive  results are indicative of active infection with SARS-CoV-2.  Clinical  correlation with patient history and other diagnostic information is  necessary to determine patient infection status.  Positive results do  not rule out bacterial infection or co-infection with other viruses. If result is PRESUMPTIVE POSTIVE SARS-CoV-2 nucleic acids MAY BE PRESENT.   A presumptive positive result was obtained on the submitted specimen  and confirmed on repeat testing.  While 2019 novel coronavirus  (SARS-CoV-2) nucleic acids may be present in the submitted sample  additional confirmatory testing may be necessary for epidemiological  and / or clinical management purposes  to differentiate between  SARS-CoV-2 and other Sarbecovirus currently known to infect humans.  If clinically indicated additional testing with an alternate test  methodology [phone]) is advised. The SARS-CoV-2 RNA is generally  detectable in upper and lower respiratory sp ecimens during the acute  phase of infection. The expected result is Negative. Fact Sheet for Patients:  BoilerBrush.com.cy Fact Sheet for Healthcare Providers: https://pope.com/ This test is not yet approved or cleared by the [place] FDA and has been authorized for detection and/or diagnosis of SARS-CoV-2 by FDA under an Emergency Use Authorization (EUA).  This EUA  will remain in effect (meaning this test can be used) for the duration of the COVID-19 declaration under Section 564(b)(1) of the Act, 21 U.S.C. section 360bbb-3(b)(1), unless the authorization is terminated or revoked sooner. Performed at [facility], [address] [place], [place] [address]   MRSA PCR Screening     Status: None   Collection Time: 06/07/18  1:16 PM  Result Value Ref Range Status   MRSA by PCR NEGATIVE NEGATIVE Final    Comment:        The GeneXpert MRSA Assay (FDA approved for NASAL specimens only), is one component of a comprehensive MRSA colonization surveillance program. It is not intended to diagnose MRSA infection nor to guide or monitor treatment for MRSA infections. Performed at [facility], [address] [place], [place] [address]     RADIOLOGY:  Ct Head Wo Contrast  Result Date: 06/10/2018 CLINICAL DATA:  Stroke follow-up EXAM: CT HEAD WITHOUT CONTRAST TECHNIQUE: Contiguous axial images were obtained from the base of the skull through the vertex without intravenous contrast. COMPARISON:  Head CT from 2 days ago.  Brain MRI from 3 days ago FINDINGS: Brain: Known acute infarct in the posterior limb left internal capsule. There pre-existing lacunar infarcts in the deep gray nuclei and left cerebellum. No hemorrhage, hydrocephalus, or collection. Vascular: Atherosclerotic calcification Skull: Negative Sinuses/Orbits: Negative IMPRESSION: 1. 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 2. Chronic small-vessel ischemic injury. Electronically Signed   By: Marnee Spring M.D.   On: 06/10/2018 10:49     Management plans discussed with the patient, family and they are in agreement.  CODE STATUS: DNR   TOTAL TIME TAKING CARE OF THIS PATIENT: 38 minutes.    Jamesa Tedrick M.D on 06/10/2018 at 11:46 AM  Between 7am to 6pm - Pager - [phone]  After 6pm go to [email or web address] - Social research officer, government  [facility]  [facility]  Office  [phone]  CC: Primary care physician; Patient, No Pcp Per   Note: This dictation was prepared with Dragon dictation along with smaller phrase technology. Any transcriptional errors that result from this process are unintentional.

## 2018-06-10 NOTE — Progress Notes (Signed)
Dr Enid Baas aware that night shift reports that pt is a heavy 1 assist and drags right foot with ambulation to bathroom, new order for PT to eval and heat CT

## 2018-06-10 NOTE — TOC Transition Note (Signed)
Transition of Care [facility]) - CM/SW Discharge Note   Patient Details  Name: Rachel [name] MRN: [id number] Date of Birth: 11-Sep-1955  [facility] CM/SW Contact:  Ruthe Mannan, LCSWA Phone Number: 06/10/2018, 12:05 PM   Clinical Narrative:   Patient is medically ready for discharge today. Patient will be discharged home today. CSW [facility] of [facility] today. Patient states her husband will transport.     Final next level of care: Home w Home Health Services Barriers to Discharge: No Barriers Identified   Patient Goals and CMS Choice Patient states their goals for this hospitalization and ongoing recovery are:: Return home  CMS Medicare.gov Compare Post Acute Care list provided to:: Patient Choice offered to / list presented to : Patient  Discharge Placement                    Patient and family notified of of transfer: 06/10/18  Discharge Plan and Services   Discharge Planning Services: CM Consult Post Acute Care Choice: Home Health                    HH Arranged: RN, PT, OT, Refused SNF [facility] Agency: [facility] Date [facility] Agency Contacted: 06/10/18 Time HH Agency Contacted: 1158 Representative spoke with at [facility] Agency: Feliberto Gottron    Social Determinants of Health (SDOH) Interventions     Readmission Risk Interventions No flowsheet data found.

## 2018-06-15 ENCOUNTER — Emergency Department
Admission: EM | Admit: 2018-06-15 | Discharge: 2018-06-15 | Disposition: A | Discharge status: Home / Self Care | Payer: Medicare HMO | Attending: Emergency Medicine | Admitting: Emergency Medicine

## 2018-06-15 ENCOUNTER — Emergency Department: Payer: Medicare HMO

## 2018-06-15 ENCOUNTER — Other Ambulatory Visit: Payer: Self-pay

## 2018-06-15 DIAGNOSIS — R410 Disorientation, unspecified: Secondary | ICD-10-CM | POA: Diagnosis not present

## 2018-06-15 DIAGNOSIS — R29810 Facial weakness: Secondary | ICD-10-CM | POA: Diagnosis not present

## 2018-06-15 DIAGNOSIS — R0902 Hypoxemia: Secondary | ICD-10-CM | POA: Diagnosis not present

## 2018-06-15 DIAGNOSIS — G459 Transient cerebral ischemic attack, unspecified: Secondary | ICD-10-CM | POA: Diagnosis not present

## 2018-06-15 DIAGNOSIS — R4182 Altered mental status, unspecified: Secondary | ICD-10-CM | POA: Diagnosis not present

## 2018-06-15 DIAGNOSIS — I10 Essential (primary) hypertension: Secondary | ICD-10-CM | POA: Insufficient documentation

## 2018-06-15 DIAGNOSIS — R69 Illness, unspecified: Secondary | ICD-10-CM | POA: Diagnosis not present

## 2018-06-15 DIAGNOSIS — R569 Unspecified convulsions: Secondary | ICD-10-CM | POA: Diagnosis not present

## 2018-06-15 DIAGNOSIS — Z79899 Other long term (current) drug therapy: Secondary | ICD-10-CM | POA: Diagnosis not present

## 2018-06-15 DIAGNOSIS — Z7901 Long term (current) use of anticoagulants: Secondary | ICD-10-CM | POA: Diagnosis not present

## 2018-06-15 DIAGNOSIS — F17210 Nicotine dependence, cigarettes, uncomplicated: Secondary | ICD-10-CM | POA: Diagnosis not present

## 2018-06-15 LAB — BASIC METABOLIC PANEL
Anion gap: 11 (ref 5–15)
BUN: 13 mg/dL (ref 8–23)
CO2: 24 mmol/L (ref 22–32)
Calcium: 8.6 mg/dL — ABNORMAL LOW (ref 8.9–10.3)
Chloride: 103 mmol/L (ref 98–111)
Creatinine, Ser: 0.65 mg/dL (ref 0.44–1.00)
GFR calc Af Amer: >60 mL/min (ref >60)
GFR calc non Af Amer: >60 mL/min (ref >60)
Glucose, Bld: 105 mg/dL — ABNORMAL HIGH (ref 70–99)
Potassium: 3.5 mmol/L (ref 3.5–5.1)
Sodium: 138 mmol/L (ref 135–145)

## 2018-06-15 LAB — CBC WITH DIFFERENTIAL/PLATELET
Abs Immature Granulocytes: 0.04 10*3/uL (ref 0.00–0.07)
Basophils Absolute: 0 10*3/uL (ref 0.0–0.1)
Basophils Relative: 0 %
Eosinophils Absolute: 0.2 10*3/uL (ref 0.0–0.5)
Eosinophils Relative: 2 %
HCT: 44 % (ref 36.0–46.0)
Hemoglobin: 14.4 g/dL (ref 12.0–15.0)
Immature Granulocytes: 0 %
Lymphocytes Relative: 18 %
Lymphs Abs: 1.7 10*3/uL (ref 0.7–4.0)
MCH: 27.9 pg (ref 26.0–34.0)
MCHC: 32.7 g/dL (ref 30.0–36.0)
MCV: 85.1 fL (ref 80.0–100.0)
Monocytes Absolute: 0.7 10*3/uL (ref 0.1–1.0)
Monocytes Relative: 8 %
Neutro Abs: 6.8 10*3/uL (ref 1.7–7.7)
Neutrophils Relative %: 72 %
Platelets: 379 10*3/uL (ref 150–400)
RBC: 5.17 MIL/uL — ABNORMAL HIGH (ref 3.87–5.11)
RDW: 13.2 % (ref 11.5–15.5)
WBC: 9.5 10*3/uL (ref 4.0–10.5)
nRBC: 0 % (ref 0.0–0.2)

## 2018-06-15 LAB — URINALYSIS, COMPLETE (UACMP) WITH MICROSCOPIC
Bacteria, UA: NONE SEEN
Bilirubin Urine: NEGATIVE
Glucose, UA: NEGATIVE mg/dL
Ketones, ur: NEGATIVE mg/dL
Nitrite: NEGATIVE
Protein, ur: NEGATIVE mg/dL
Specific Gravity, Urine: 1.003 — ABNORMAL LOW (ref 1.005–1.030)
pH: 6 (ref 5.0–8.0)

## 2018-06-15 LAB — ETHANOL: Alcohol, Ethyl (B): <10 mg/dL (ref <10)

## 2018-06-15 MED ORDER — LORAZEPAM 2 MG/ML IJ SOLN
1.0000 mg | Freq: Once | INTRAMUSCULAR | Status: AC
  Administered 2018-06-15: 1 mg via INTRAVENOUS
  Filled 2018-06-15: qty 1

## 2018-06-15 NOTE — ED Notes (Signed)
Pt states she does not have anyone that can pick her up. Pt states she does not know anyone.

## 2018-06-15 NOTE — ED Notes (Signed)
Pt back from ct. Pt stumbled as she was walking to toilet and is aggitated that she is still here. Pt keeps repeating that she wants a cigarette. Pt states that her husband can't pick her up and she has no family in town.

## 2018-06-15 NOTE — Discharge Instructions (Signed)
Your lab tests, chest xray, and CT scan of the head were all okay today. Please follow up with your doctor for continued evaluation of your symptoms today.

## 2018-06-15 NOTE — ED Notes (Signed)
Attempted to call husband. Straight to voicemail.

## 2018-06-15 NOTE — ED Provider Notes (Signed)
[facility] Emergency Department Provider Note  ____________________________________________  Time seen: Approximately 8:12 PM  I have reviewed the triage vital signs and the nursing notes.   HISTORY  Chief Complaint Seizures    Level 5 Caveat: Portions of the History and Physical including HPI and review of systems are unable to be completely obtained due to patient being a poor historian due to confusion  HPI Rachel [name] is a 63 y.o. female with a history of hypertension, hepatitis C, bipolar disorder, and a recent stroke 1 week ago with residual right-sided deficit who is brought to the ED today due to a suspected seizure at home.  EMS report that husband had called them and reported that the patient had unresponsiveness and posturing at home.  Resolved by the time EMS arrived.  Patient is noted to have a facial droop which is unchanged since her recent hospitalization, and no other current new symptoms.  Patient denies any acute chest pain shortness of breath belly pain headache vision changes or weakness.  She states that she wants to leave so that she can smoke.  Denies any memory of today's events.  No reported safety concerns.      Past Medical History:  Diagnosis Date  . Hepatitis C   . Hypertension      Patient Active Problem List   Diagnosis Date Noted  . Acute cerebral infarction (HCC) 06/07/2018  . CVA (cerebral vascular accident) (HCC) 06/07/2018  . Vitamin B12 deficiency 05/06/2016  . Cerebrovascular disease 05/04/2016  . Bipolar 2 disorder, major depressive episode (HCC) 05/02/2016  . Tobacco use disorder 05/02/2016  . Opioid use disorder, mild, abuse (HCC) 05/02/2016  . HTN (hypertension) 05/02/2016     Past Surgical History:  Procedure Laterality Date  . KNEE ARTHROSCOPY       Prior to Admission medications   Medication Sig Start Date End Date Taking? Authorizing Provider  aspirin 81 MG tablet Take 1 tablet (81 mg total)  by mouth daily. Patient not taking: Reported on 06/07/2018 02/23/18   Nita Sickle, MD  atorvastatin (LIPITOR) 40 MG tablet Take 1 tablet (40 mg total) by mouth daily at 6 PM. 06/10/18   Enid Baas, Jude, MD  clopidogrel (PLAVIX) 75 MG tablet Take 1 tablet (75 mg total) by mouth daily. 06/11/18   Enid Baas Jude, MD  cyanocobalamin 1000 MCG tablet Take 1 tablet (1,000 mcg total) by mouth daily. Patient not taking: Reported on 06/07/2018 05/06/16   Pucilowska, Braulio Conte B, MD  feeding supplement, ENSURE ENLIVE, (ENSURE ENLIVE) LIQD Take 237 mLs by mouth 2 (two) times daily between meals. 06/10/18   Enid Baas Jude, MD  hydrochlorothiazide (HYDRODIURIL) 25 MG tablet Take 1 tablet (25 mg total) by mouth daily. Patient not taking: Reported on 06/07/2018 05/07/16   Pucilowska, Braulio Conte B, MD  OLANZapine zydis (ZYPREXA) 5 MG disintegrating tablet Take 1 tablet (5 mg total) by mouth at bedtime. 06/10/18   Enid Baas Jude, MD     Allergies Tetracycline; Penicillins; and Tetracyclines & related   History reviewed. No pertinent family history.  Social History Social History   Tobacco Use  . Smoking status: Current Every Day Smoker    Packs/day: 1.00    Types: Cigarettes  . Smokeless tobacco: Never Used  Substance Use Topics  . Alcohol use: Yes    Comment: rarely  . Drug use: Yes    Frequency: 1.0 times per week    Types: Marijuana    Review of Systems Level 5 Caveat: Portions of the  History and Physical including HPI and review of systems are unable to be completely obtained due to patient being a poor historian   Constitutional:   No known fever.  ENT:   No rhinorrhea. Cardiovascular:   No chest pain or syncope. Respiratory:   No dyspnea or cough. Gastrointestinal:   Negative for abdominal pain, vomiting and diarrhea.  Musculoskeletal:   Negative for focal pain or swelling ____________________________________________   PHYSICAL EXAM:  VITAL SIGNS: ED Triage Vitals  Enc Vitals Group     BP 06/15/18 1932 (!)  154/90     Pulse Rate 06/15/18 1932 74     Resp 06/15/18 1932 14     Temp --      Temp src --      SpO2 06/15/18 1932 96 %     Weight 06/15/18 1934 110 lb (49.9 kg)     Height 06/15/18 1934  (1.676 m)     Head Circumference --      Peak Flow --      Pain Score 06/15/18 1933 0     Pain Loc --      Pain Edu? --      Excl. in GC? --     Vital signs reviewed, nursing assessments reviewed.   Constitutional:   Alert and oriented to person and place. Non-toxic appearance. Eyes:   Conjunctivae are normal. EOMI. PERRL, 2mm b/l. ENT      Head:   Normocephalic and atraumatic.      Nose:   No congestion/rhinnorhea.       Mouth/Throat:   MMM, no pharyngeal erythema. No peritonsillar mass.       Neck:   No meningismus. Full ROM. Hematological/Lymphatic/Immunilogical:   No cervical lymphadenopathy. Cardiovascular:   RRR. Symmetric bilateral radial and DP pulses.  No murmurs. Cap refill less than 2 seconds. Respiratory:   Normal respiratory effort without tachypnea/retractions.  Breath sounds are symmetric and coarse diffusely. Gastrointestinal:   Soft and nontender. Non distended. There is no CVA tenderness.  No rebound, rigidity, or guarding. Genitourinary:   deferred Musculoskeletal:   Normal range of motion in all extremities. No joint effusions.  No lower extremity tenderness.  No edema. Neurologic:   Normal speech and language.  Motor grossly intact, relatively diminished on the right. No acute focal neurologic deficits are appreciated.  Skin:    Skin is warm, dry and intact. No rash noted.  No petechiae, purpura, or bullae.  There is a 25 mcg/h fentanyl patch behind the right shoulder  ____________________________________________    LABS (pertinent positives/negatives) (all labs ordered are listed, but only abnormal results are displayed) Labs Reviewed  BASIC METABOLIC PANEL - Abnormal; Notable for the following components:      Result Value   Glucose, Bld 105 (*)     Calcium 8.6 (*)    All other components within normal limits  CBC WITH DIFFERENTIAL/PLATELET - Abnormal; Notable for the following components:   RBC 5.17 (*)    All other components within normal limits  URINALYSIS, COMPLETE (UACMP) WITH MICROSCOPIC - Abnormal; Notable for the following components:   Color, Urine STRAW (*)    APPearance CLEAR (*)    Specific Gravity, Urine 1.003 (*)    Hgb urine dipstick SMALL (*)    Leukocytes,Ua TRACE (*)    All other components within normal limits  URINE CULTURE  ETHANOL   ____________________________________________   EKG  Interpreted by me Sinus rhythm rate of 73, normal axis and intervals.  Normal QRS ST segments and T waves.  ____________________________________________    RADIOLOGY  Ct Head Wo Contrast  Result Date: 06/15/2018 CLINICAL DATA:  Seizure, new, nontraumatic, >40 yrs. Recent stroke. EXAM: CT HEAD WITHOUT CONTRAST TECHNIQUE: Contiguous axial images were obtained from the base of the skull through the vertex without intravenous contrast. COMPARISON:  Head CT 06/10/2018, brain MRI 06/07/2018 FINDINGS: Brain: No acute hemorrhage. Unchanged appearance of recent lacunar infarct of the left internal capsule. No definite progression by CT. No evidence of new ischemia. More remote lacunar infarcts in the left cerebellum and right basal ganglia are unchanged. No hydrocephalus, the basilar cisterns are patent. No subdural or extra-axial collection. Vascular: No hyperdense vessel. Atherosclerosis of skull base vasculature. Skull: No fracture or focal lesion. Sinuses/Orbits: Paranasal sinuses and mastoid air cells are clear. The visualized orbits are unremarkable. Other: None. IMPRESSION: Unchanged appearance of recent lacunar infarct in the left internal capsule. No evidence of progression or hemorrhagic transformation. No other acute abnormality. Electronically Signed   By: Narda Rutherford M.D.   On: 06/15/2018 19:58   Dg Chest Portable 1  View  Result Date: 06/15/2018 CLINICAL DATA:  Altered mental status EXAM: PORTABLE CHEST 1 VIEW COMPARISON:  04/24/2016 FINDINGS: The heart size and mediastinal contours are within normal limits. Both lungs are clear. The visualized skeletal structures are unremarkable. IMPRESSION: No active disease. Electronically Signed   By: Marlan Palau M.D.   On: 06/15/2018 19:47    ____________________________________________   PROCEDURES Procedures  ____________________________________________  DIFFERENTIAL DIAGNOSIS   Intracranial hemorrhage, cerebral edema, electrolyte disturbance, pneumonia  CLINICAL IMPRESSION / ASSESSMENT AND PLAN / ED COURSE  Pertinent labs & imaging results that were available during my care of the patient were reviewed by me and considered in my medical decision making (see chart for details).   Rachel [name] was evaluated in Emergency Department on 06/15/2018 for the symptoms described in the history of present illness. She was evaluated in the context of the global COVID-19 pandemic, which necessitated consideration that the patient might be at risk for infection with the SARS-CoV-2 virus that causes COVID-19. Institutional protocols and algorithms that pertain to the evaluation of patients at risk for COVID-19 are in a state of rapid change based on information released by regulatory bodies including the CDC and federal and state organizations. These policies and algorithms were followed during the patient's care in the ED.     Clinical Course as of Jun 15 2107  Wed Jun 15, 2018  1928 Pt wants to leave to smoke a cigarette. She is not fully oriented, and presentation is concerning for possible ICH. She also has a psychiatric history.  At this time I don't think she has medical decision making capacity.  Will give IV ativan for anxiolysis. If she attempts to leave AMA, I will plan to give haldol to calm her and allow for appropriate work up.    [PS]  2105 Labs all  unremarkable.  Sx may be related to fentanyl patch, but removing it would also result in substantial discomfort. Will DC home to f/u with pcp   [PS]    Clinical Course User Index [PS] Sharman Cheek, MD     ----------------------------------------- 8:20 PM on 06/15/2018 -----------------------------------------  Chest x-ray and CT head are unremarkable.  We will follow-up labs, if negative patient is stable for discharge home given no recurrent episodes, plan for follow-up with neurology.Marland Kitchen  ----------------------------------------- 9:09 PM on 06/15/2018 -----------------------------------------  Prior encounters in the electronic medical records  show that confusion is also a chronic issue for her.  She is medically stable for outpatient follow-up.  ____________________________________________   FINAL CLINICAL IMPRESSION(S) / ED DIAGNOSES    Final diagnoses:  Confusion     ED Discharge Orders    None      Portions of this note were generated with dragon dictation software. Dictation errors may occur despite best attempts at proofreading.   Sharman CheekStafford, Khamryn Calderone, MD 06/15/18 2109

## 2018-06-15 NOTE — ED Notes (Signed)
Spoke with patient's husband. He is attempting to arrange transportation.

## 2018-06-15 NOTE — ED Notes (Signed)
Spoke to patient's husband. He states that a friend will pick him up and they will come get the patient. He is unable to give a time frame for when he will arrive.

## 2018-06-15 NOTE — ED Triage Notes (Signed)
Pt to the er for seizure like activity. Pt coming from home and describes a decorticut seizure to EMS that last ed one minute. Pt admitted last week for a stroke. Pt has a droop to the right side of face from that stroke. Pt states she does not want to be here and she wants a cigarette. When asked why husband called EMS, pt states "because hes stupid." No hx of seizure. Pt has hx of COPD, excessive mucus, HR 80s, BP 180/90, BG 99. Negative stroke screen.

## 2018-06-15 NOTE — ED Notes (Signed)
Pt sitting in the doorway of room and is quiet and cooperative at this time.

## 2018-06-15 NOTE — ED Notes (Signed)
Pt continues to ask about a cigarette. Pt passed swallow screen. Pt thinks it is 2018 and Rachel [name] is the president. When corrected by staff she states "I don't care, I want a cigarette." Pt does not have memory of events today or last week with hospital admission.

## 2018-06-16 LAB — URINE CULTURE: Culture: <10000 — AB

## 2018-06-19 ENCOUNTER — Other Ambulatory Visit: Payer: Self-pay

## 2018-06-19 ENCOUNTER — Encounter: Payer: Self-pay | Admitting: Emergency Medicine

## 2018-06-19 ENCOUNTER — Emergency Department
Admission: EM | Admit: 2018-06-19 | Discharge: 2018-06-20 | Disposition: A | Discharge status: Home / Self Care | Payer: Medicare HMO | Attending: Emergency Medicine | Admitting: Emergency Medicine

## 2018-06-19 ENCOUNTER — Emergency Department: Payer: Medicare HMO

## 2018-06-19 DIAGNOSIS — Z79899 Other long term (current) drug therapy: Secondary | ICD-10-CM | POA: Insufficient documentation

## 2018-06-19 DIAGNOSIS — F17210 Nicotine dependence, cigarettes, uncomplicated: Secondary | ICD-10-CM | POA: Insufficient documentation

## 2018-06-19 DIAGNOSIS — E876 Hypokalemia: Secondary | ICD-10-CM | POA: Diagnosis not present

## 2018-06-19 DIAGNOSIS — Z7901 Long term (current) use of anticoagulants: Secondary | ICD-10-CM | POA: Diagnosis not present

## 2018-06-19 DIAGNOSIS — R29810 Facial weakness: Secondary | ICD-10-CM | POA: Diagnosis not present

## 2018-06-19 DIAGNOSIS — Z03818 Encounter for observation for suspected exposure to other biological agents ruled out: Secondary | ICD-10-CM | POA: Diagnosis not present

## 2018-06-19 DIAGNOSIS — R404 Transient alteration of awareness: Secondary | ICD-10-CM | POA: Diagnosis not present

## 2018-06-19 DIAGNOSIS — R569 Unspecified convulsions: Secondary | ICD-10-CM | POA: Diagnosis not present

## 2018-06-19 DIAGNOSIS — I10 Essential (primary) hypertension: Secondary | ICD-10-CM | POA: Diagnosis not present

## 2018-06-19 DIAGNOSIS — R69 Illness, unspecified: Secondary | ICD-10-CM | POA: Diagnosis not present

## 2018-06-19 DIAGNOSIS — I639 Cerebral infarction, unspecified: Secondary | ICD-10-CM | POA: Diagnosis not present

## 2018-06-19 DIAGNOSIS — R410 Disorientation, unspecified: Secondary | ICD-10-CM | POA: Diagnosis not present

## 2018-06-19 DIAGNOSIS — R0902 Hypoxemia: Secondary | ICD-10-CM | POA: Diagnosis not present

## 2018-06-19 HISTORY — DX: Disorientation, unspecified: R41.0

## 2018-06-19 NOTE — ED Triage Notes (Signed)
Pt arrives via [facility] with c/o seizure like activity per husband. Pt states that she is here "for nothin" and "I want some coffee". Pt is talking at this time and in NAD. Per [facility], pt's VS are WDL.

## 2018-06-20 ENCOUNTER — Encounter: Payer: Self-pay | Admitting: Emergency Medicine

## 2018-06-20 DIAGNOSIS — Z79899 Other long term (current) drug therapy: Secondary | ICD-10-CM | POA: Diagnosis not present

## 2018-06-20 DIAGNOSIS — E876 Hypokalemia: Secondary | ICD-10-CM | POA: Diagnosis not present

## 2018-06-20 DIAGNOSIS — R404 Transient alteration of awareness: Secondary | ICD-10-CM | POA: Diagnosis not present

## 2018-06-20 DIAGNOSIS — Z7901 Long term (current) use of anticoagulants: Secondary | ICD-10-CM | POA: Diagnosis not present

## 2018-06-20 DIAGNOSIS — R69 Illness, unspecified: Secondary | ICD-10-CM | POA: Diagnosis not present

## 2018-06-20 DIAGNOSIS — I639 Cerebral infarction, unspecified: Secondary | ICD-10-CM | POA: Diagnosis not present

## 2018-06-20 DIAGNOSIS — I10 Essential (primary) hypertension: Secondary | ICD-10-CM | POA: Diagnosis not present

## 2018-06-20 LAB — CBC WITH DIFFERENTIAL/PLATELET
Abs Immature Granulocytes: 0.04 10*3/uL (ref 0.00–0.07)
Basophils Absolute: 0 10*3/uL (ref 0.0–0.1)
Basophils Relative: 0 %
Eosinophils Absolute: 0.1 10*3/uL (ref 0.0–0.5)
Eosinophils Relative: 1 %
HCT: 39.2 % (ref 36.0–46.0)
Hemoglobin: 12.8 g/dL (ref 12.0–15.0)
Immature Granulocytes: 0 %
Lymphocytes Relative: 10 %
Lymphs Abs: 1 10*3/uL (ref 0.7–4.0)
MCH: 28.1 pg (ref 26.0–34.0)
MCHC: 32.7 g/dL (ref 30.0–36.0)
MCV: 86.2 fL (ref 80.0–100.0)
Monocytes Absolute: 0.6 10*3/uL (ref 0.1–1.0)
Monocytes Relative: 6 %
Neutro Abs: 8.1 10*3/uL — ABNORMAL HIGH (ref 1.7–7.7)
Neutrophils Relative %: 83 %
Platelets: 404 10*3/uL — ABNORMAL HIGH (ref 150–400)
RBC: 4.55 MIL/uL (ref 3.87–5.11)
RDW: 13.3 % (ref 11.5–15.5)
WBC: 9.9 10*3/uL (ref 4.0–10.5)
nRBC: 0 % (ref 0.0–0.2)

## 2018-06-20 LAB — BASIC METABOLIC PANEL
Anion gap: 10 (ref 5–15)
BUN: 19 mg/dL (ref 8–23)
CO2: 21 mmol/L — ABNORMAL LOW (ref 22–32)
Calcium: 8.1 mg/dL — ABNORMAL LOW (ref 8.9–10.3)
Chloride: 109 mmol/L (ref 98–111)
Creatinine, Ser: 0.71 mg/dL (ref 0.44–1.00)
GFR calc Af Amer: >60 mL/min (ref >60)
GFR calc non Af Amer: >60 mL/min (ref >60)
Glucose, Bld: 121 mg/dL — ABNORMAL HIGH (ref 70–99)
Potassium: 3.1 mmol/L — ABNORMAL LOW (ref 3.5–5.1)
Sodium: 140 mmol/L (ref 135–145)

## 2018-06-20 LAB — LACTIC ACID, PLASMA: Lactic Acid, Venous: 1.3 mmol/L (ref 0.5–1.9)

## 2018-06-20 LAB — ETHANOL: Alcohol, Ethyl (B): <10 mg/dL (ref <10)

## 2018-06-20 MED ORDER — POTASSIUM CHLORIDE CRYS ER 20 MEQ PO TBCR
40.0000 meq | EXTENDED_RELEASE_TABLET | Freq: Once | ORAL | Status: AC
  Administered 2018-06-20: 40 meq via ORAL
  Filled 2018-06-20: qty 2

## 2018-06-20 NOTE — ED Notes (Signed)
IV attempted x 2 by this RN. Labs able to be collected at this time but no IV access.

## 2018-06-20 NOTE — Discharge Instructions (Signed)
Your workup in the Emergency Department today was reassuring.  We did not find any specific abnormalities.  We recommend you drink plenty of fluids, take your regular medications and/or any new ones prescribed today, and follow up with the doctor(s) listed in these documents as recommended.  Return to the Emergency Department if you develop new or worsening symptoms that concern you.  

## 2018-06-20 NOTE — ED Provider Notes (Signed)
[facility] Emergency Department Provider Note  ____________________________________________   First MD Initiated Contact with Patient 06/19/18 2339     (approximate)  I have reviewed the triage vital signs and the nursing notes.   HISTORY  Chief Complaint Possible seizure  Level 5 caveat:  history/ROS limited by some chronic confusion and the patient being a vague historian.  HPI Rachel A [name] is a 63 y.o. female with medical history as listed below who presents by EMS for evaluation of a possible seizure.  Very little information has been provided but apparently the husband was concerned she was having a seizure.  She says she does not know why she is here and says that she was sitting at home, not asleep, and the next thing she knew the paramedics were there.  She is asking for coffee and is in no distress.  She adamantly denies any pain.  Specifically she denies fever, neck pain, visual changes, sore throat, chest pain, shortness of breath, nausea, vomiting, abdominal pain, and dysuria.  Nothing in particular makes her symptoms better or worse and apparently they were acute in onset.  The intensity and quality the symptoms cannot be identified because the patient denies having any symptoms.  She is alert and oriented to at least self and location at this time.         Past Medical History:  Diagnosis Date   Chronic confusion    well documented in the medical record   Hepatitis C    Hypertension     Patient Active Problem List   Diagnosis Date Noted   Acute cerebral infarction (HCC) 06/07/2018   CVA (cerebral vascular accident) (HCC) 06/07/2018   Vitamin B12 deficiency 05/06/2016   Cerebrovascular disease 05/04/2016   Bipolar 2 disorder, major depressive episode (HCC) 05/02/2016   Tobacco use disorder 05/02/2016   Opioid use disorder, mild, abuse (HCC) 05/02/2016   HTN (hypertension) 05/02/2016    Past Surgical History:  Procedure  Laterality Date   KNEE ARTHROSCOPY      Prior to Admission medications   Medication Sig Start Date End Date Taking? Authorizing Provider  aspirin 81 MG tablet Take 1 tablet (81 mg total) by mouth daily. Patient not taking: Reported on 06/07/2018 02/23/18   Nita SickleVeronese, [place], MD  atorvastatin (LIPITOR) 40 MG tablet Take 1 tablet (40 mg total) by mouth daily at 6 PM. 06/10/18   Enid Baasjie, Jude, MD  clopidogrel (PLAVIX) 75 MG tablet Take 1 tablet (75 mg total) by mouth daily. 06/11/18   Enid Baasjie, Jude, MD  cyanocobalamin 1000 MCG tablet Take 1 tablet (1,000 mcg total) by mouth daily. Patient not taking: Reported on 06/07/2018 05/06/16   Pucilowska, Braulio ConteJolanta B, MD  feeding supplement, ENSURE ENLIVE, (ENSURE ENLIVE) LIQD Take 237 mLs by mouth 2 (two) times daily between meals. 06/10/18   Enid Baasjie, Jude, MD  hydrochlorothiazide (HYDRODIURIL) 25 MG tablet Take 1 tablet (25 mg total) by mouth daily. Patient not taking: Reported on 06/07/2018 05/07/16   Pucilowska, Braulio ConteJolanta B, MD  OLANZapine zydis (ZYPREXA) 5 MG disintegrating tablet Take 1 tablet (5 mg total) by mouth at bedtime. 06/10/18   Enid Baasjie, Jude, MD    Allergies Tetracycline; Penicillins; and Tetracyclines & related  History reviewed. No pertinent family history.  Social History Social History   Tobacco Use   Smoking status: Current Every Day Smoker    Packs/day: 1.00    Types: Cigarettes   Smokeless tobacco: Never Used  Substance Use Topics   Alcohol use: Yes  Comment: rarely   Drug use: Yes    Frequency: 1.0 times per week    Types: Marijuana    Review of Systems Constitutional: No fever/chills Eyes: No visual changes. ENT: No sore throat. Cardiovascular: Denies chest pain. Respiratory: Denies shortness of breath. Gastrointestinal: No abdominal pain.  No nausea, no vomiting.  No diarrhea.  No constipation. Genitourinary: Negative for dysuria. Musculoskeletal: Negative for neck pain.  Negative for back pain. Integumentary: Negative for  rash. Neurological: Possible seizure-like activity based on the husband's report.  Negative for headaches, focal weakness or numbness.   ____________________________________________   PHYSICAL EXAM:  VITAL SIGNS: ED Triage Vitals  Enc Vitals Group     BP 06/19/18 2335 (!) 153/88     Pulse Rate 06/19/18 2335 75     Resp 06/19/18 2335 19     Temp 06/19/18 2335 98.5 F (36.9 C)     Temp src --      SpO2 06/19/18 2335 95 %     Weight 06/19/18 2332 49.9 kg (110 lb)     Height 06/19/18 2332 1.676 m ( )     Head Circumference --      Peak Flow --      Pain Score 06/19/18 2332 0     Pain Loc --      Pain Edu? --      Excl. in GC? --     Constitutional: Alert and oriented to person and location.  No acute distress. Eyes: Conjunctivae are normal. PERRL. EOMI. Head: Atraumatic. Nose: No congestion/rhinnorhea. Mouth/Throat: Mucous membranes are moist. Neck: No stridor.  No meningeal signs.   Cardiovascular: Normal rate, regular rhythm. Good peripheral circulation. Grossly normal heart sounds. Respiratory: Normal respiratory effort.  No retractions. No audible wheezing. Gastrointestinal: Soft and nontender. No distention.  Musculoskeletal: No lower extremity tenderness nor edema. No gross deformities of extremities. Neurologic:  Normal speech and language. No gross focal neurologic deficits are appreciated.  Skin:  Skin is warm, dry and intact. No rash noted. Psychiatric: Mood and affect are normal. Speech and behavior are normal.  ____________________________________________   LABS (all labs ordered are listed, but only abnormal results are displayed)  Labs Reviewed  CBC WITH DIFFERENTIAL/PLATELET - Abnormal; Notable for the following components:      Result Value   Platelets 404 (*)    Neutro Abs 8.1 (*)    All other components within normal limits  BASIC METABOLIC PANEL - Abnormal; Notable for the following components:   Potassium 3.1 (*)    CO2 21 (*)    Glucose,  Bld 121 (*)    Calcium 8.1 (*)    All other components within normal limits  ETHANOL  LACTIC ACID, PLASMA  LACTIC ACID, PLASMA   ____________________________________________  EKG  ED ECG REPORT I, Loleta Rose, the attending physician, personally viewed and interpreted this ECG.  Date: 06/19/2018 EKG Time: 23: 39 Rate: 73 Rhythm: normal sinus rhythm QRS Axis: normal Intervals: normal ST/T Wave abnormalities: Non-specific ST segment / T-wave changes, but no clear evidence of acute ischemia. Narrative Interpretation: no definitive evidence of acute ischemia; does not meet STEMI criteria.   ____________________________________________  RADIOLOGY  ED MD interpretation: No new/acute intracranial abnormalities.  Recent infarction which was better characterized on a prior MRI.  Official radiology report(s): Ct Head Wo Contrast  Result Date: 06/20/2018 CLINICAL DATA:  63 y/o  F; seizure-like activity. EXAM: CT HEAD WITHOUT CONTRAST TECHNIQUE: Contiguous axial images were obtained from the base of the skull  through the vertex without intravenous contrast. COMPARISON:  06/15/2018 CT head. 06/07/2018 MRI of the head. FINDINGS: Brain: Very small chronic infarcts are present within the bilateral cerebellar hemispheres, bilateral thalami, and the right lentiform nucleus. Stable distribution of left posterior limb of internal capsule infarction better characterized on recent MRI. Few nonspecific white matter hypodensities are compatible with mild chronic microvascular ischemic changes and there is mild volume loss of the brain. No new stroke, extra-axial collection, hydrocephalus, mass effect, or herniation. Vascular: Calcific atherosclerosis of the carotid siphons. No hyperdense vessel. Skull: Normal. Negative for fracture or focal lesion. Sinuses/Orbits: No acute finding. Other: None. IMPRESSION: 1. No new acute intracranial abnormality identified. 2. Stable recent left posterior limb of internal  capsule infarction better characterized on prior MRI. 3. Stable chronic microvascular ischemic changes, volume loss of the brain, and small chronic infarcts in the basal ganglia as well as cerebellum Electronically Signed   By: Mitzi Hansen M.D.   On: 06/20/2018 00:33    ____________________________________________   PROCEDURES   Procedure(s) performed (including Critical Care):  Procedures   ____________________________________________   INITIAL IMPRESSION / MDM / ASSESSMENT AND PLAN / ED COURSE  As part of my medical decision making, I reviewed the following data within the electronic MEDICAL RECORD NUMBER Nursing notes reviewed and incorporated, Labs reviewed , EKG interpreted , Old chart reviewed, Radiograph reviewed , Notes from prior ED visits and [place] Controlled Substance Database      *Lyndon A Zarr was evaluated in Emergency Department on 06/20/2018 for the symptoms described in the history of present illness. She was evaluated in the context of the global COVID-19 pandemic, which necessitated consideration that the patient might be at risk for infection with the SARS-CoV-2 virus that causes COVID-19. Institutional protocols and algorithms that pertain to the evaluation of patients at risk for COVID-19 are in a state of rapid change based on information released by regulatory bodies including the CDC and federal and state organizations. These policies and algorithms were followed during the patient's care in the ED.  Some ED evaluations and interventions may be delayed as a result of limited staffing during the pandemic.*  Differential diagnosis includes, but is not limited to, chronic confusion, seizure-like activity, medication or drug side effect, metabolic or electrolyte abnormality, acute infection.  The patient is well-appearing and in no distress with reassuring vital signs.  She is asking for coffee and does not want anything else.  She is not certain why she is here  but does not give the impression of someone who is in any sort of distress or having altered mental status.  I reviewed her medical record and see that she has a "chronic confused state".  In fact she had a visit less than a week ago that was similar with a reassuring medical work-up.  Her lab work is all within normal limits except for slightly decreased potassium which I repleted with potassium 40 mEq by mouth.  Her CT scan showed nothing acute.  There is no indication for further evaluation or admission at this time.  Of note, her lactic acid was within normal limits which is very unlikely to be the case if she actually had a seizure.  She very much wants to go home and I have no reason to keep her and she has the capacity to make her own decision as far as I can tell.  I gave my usual and customary return precautions.      ____________________________________________  FINAL CLINICAL  IMPRESSION(S) / ED DIAGNOSES  Final diagnoses:  Transient alteration of awareness     MEDICATIONS GIVEN DURING THIS VISIT:  Medications - No data to display   ED Discharge Orders    None       Note:  This document was prepared using Dragon voice recognition software and may include unintentional dictation errors.   Loleta Rose, MD 06/20/18 [phone]

## 2018-06-24 DIAGNOSIS — Z8673 Personal history of transient ischemic attack (TIA), and cerebral infarction without residual deficits: Secondary | ICD-10-CM | POA: Diagnosis not present

## 2018-06-24 DIAGNOSIS — Z1331 Encounter for screening for depression: Secondary | ICD-10-CM | POA: Diagnosis not present

## 2018-06-24 DIAGNOSIS — R404 Transient alteration of awareness: Secondary | ICD-10-CM | POA: Diagnosis not present

## 2018-06-24 DIAGNOSIS — Z681 Body mass index (BMI) 19 or less, adult: Secondary | ICD-10-CM | POA: Diagnosis not present

## 2018-06-24 DIAGNOSIS — B182 Chronic viral hepatitis C: Secondary | ICD-10-CM | POA: Diagnosis not present

## 2018-06-24 DIAGNOSIS — I10 Essential (primary) hypertension: Secondary | ICD-10-CM | POA: Diagnosis not present

## 2020-10-07 IMAGING — CT CT HEAD WITHOUT CONTRAST
3 series · 16 of 47 positions shown, 19 images · non-contrast
Comparison: Head CT from 2 days ago.  Brain MRI from 3 days ago

CLINICAL DATA: Stroke follow-up

EXAM:
CT HEAD WITHOUT CONTRAST
TECHNIQUE: Contiguous axial images were obtained from the base of the skull
through the vertex without intravenous contrast.

[Series 2: head wo · axial · 0.39mm/px · z∈[+824,+949]mm · 10 of 30 slices shown, 13 images]
[im 3/30  brain]
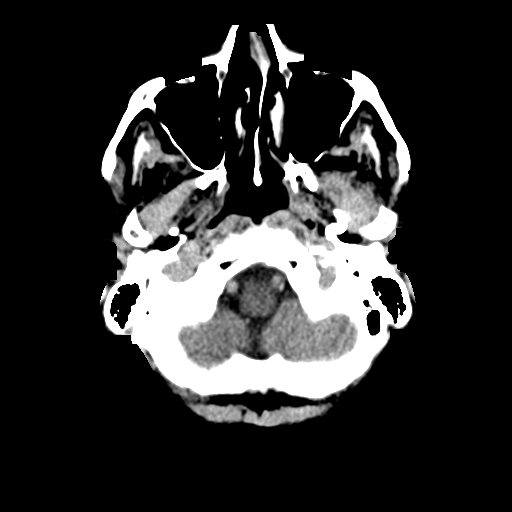
[im 3/30  bone]
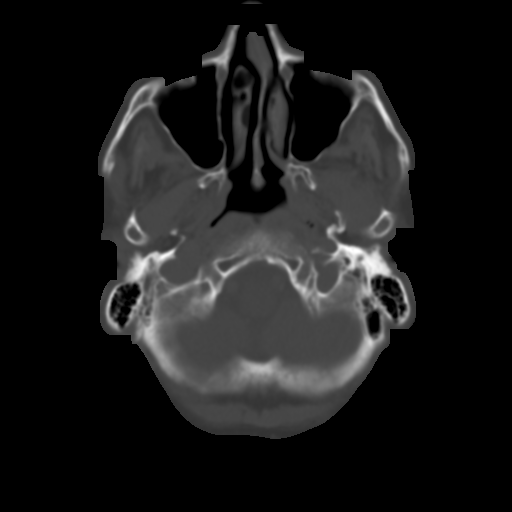
[im 6/30  brain]
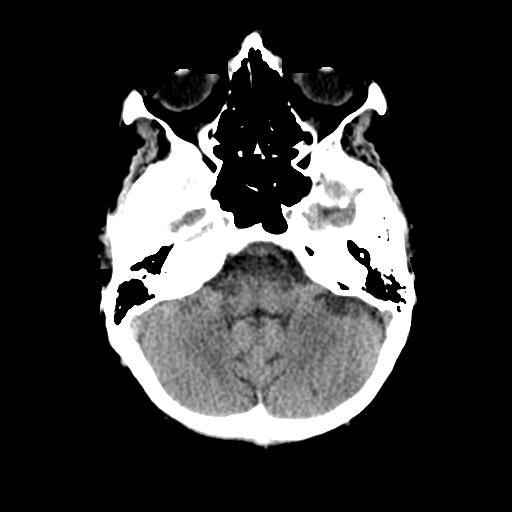
[im 9/30  brain]
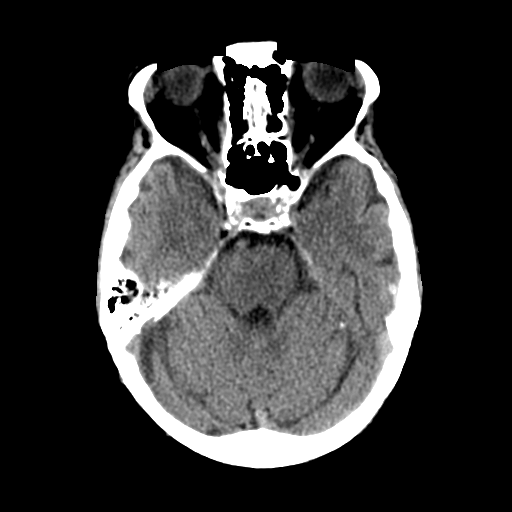
[im 11/30  brain]
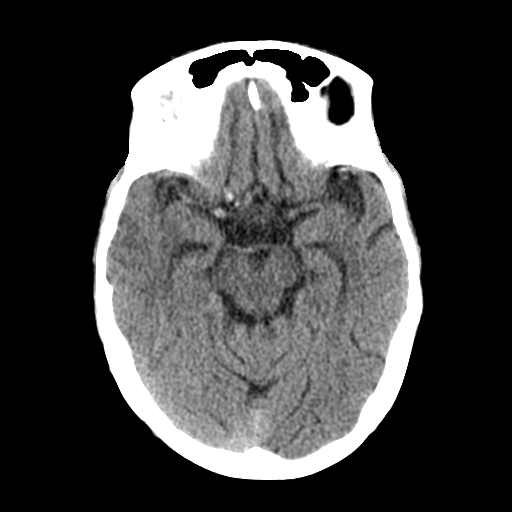
[im 14/30  brain]
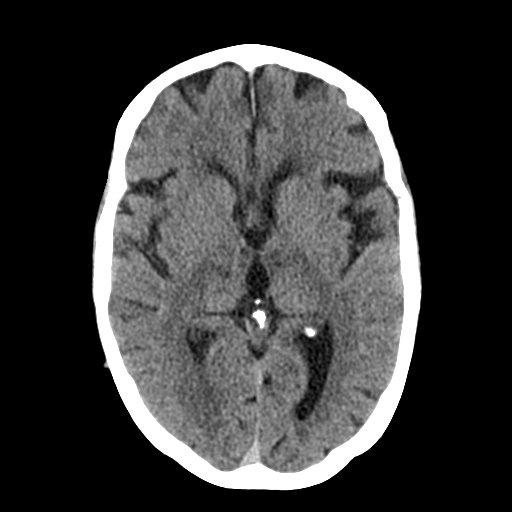
[im 14/30  bone]
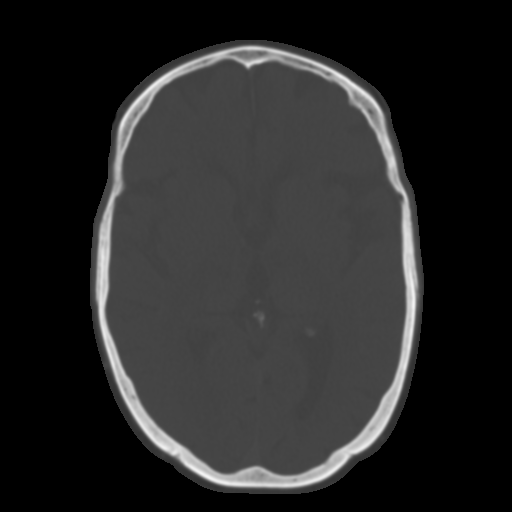
[im 17/30  brain]
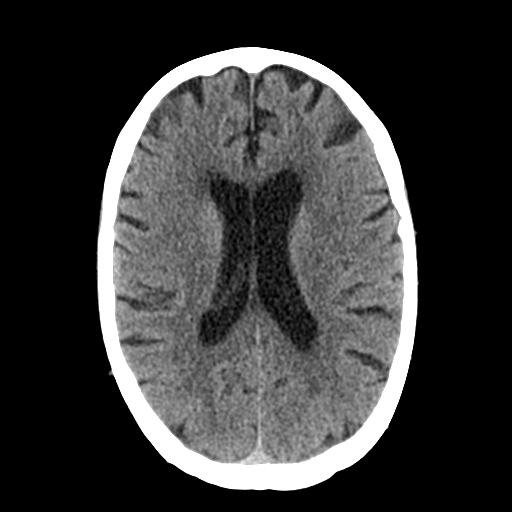
[im 20/30  brain]
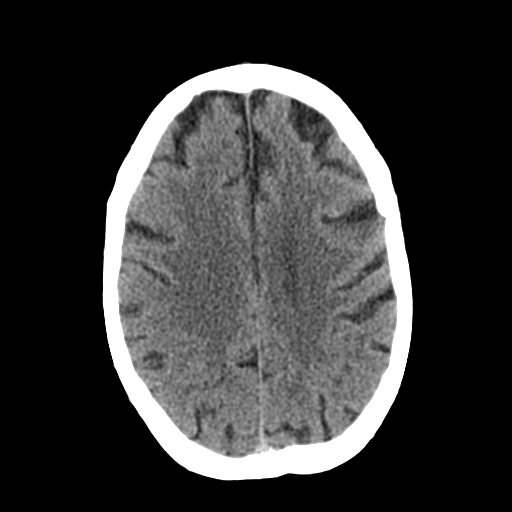
[im 23/30  brain]
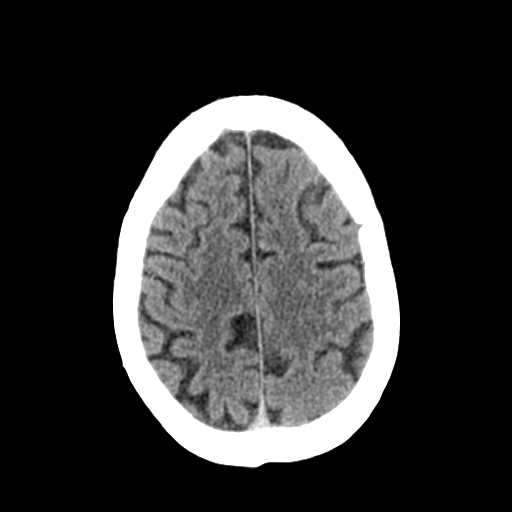
[im 25/30  brain]
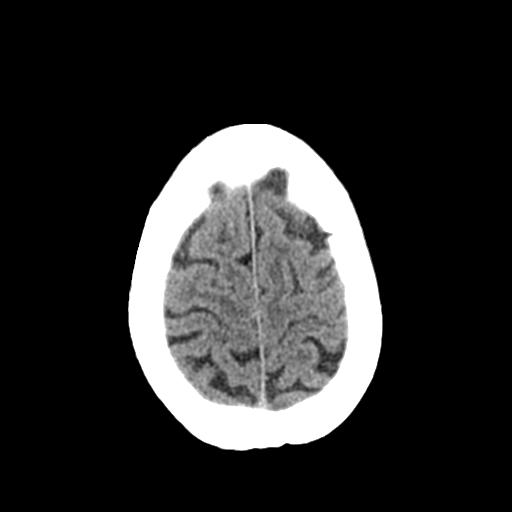
[im 25/30  bone]
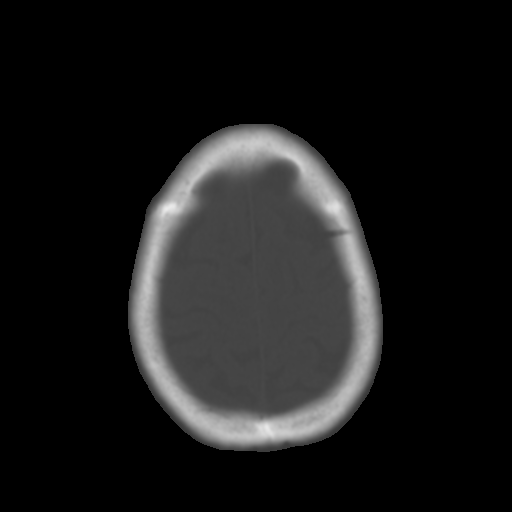
[im 28/30  brain]
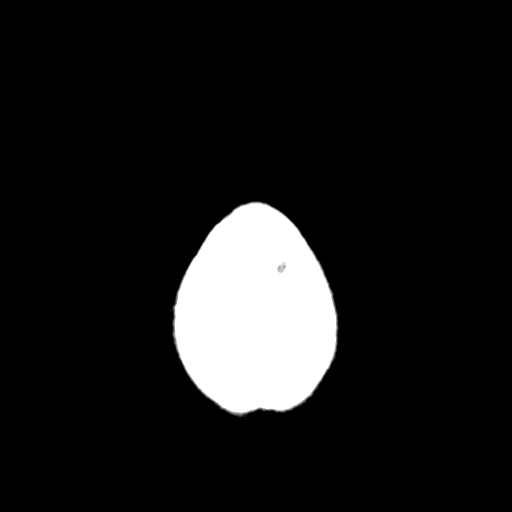

[Series 4: coronal soft tissue · coronal · 0.29mm/px · 3 of 64 slices shown]
[im 22/64  brain]
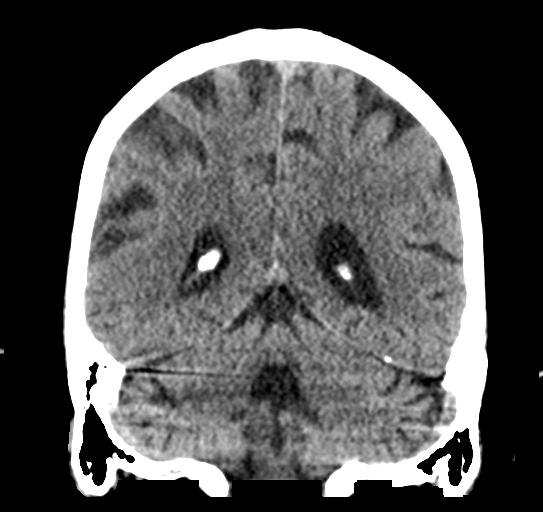
[im 29/64  brain]
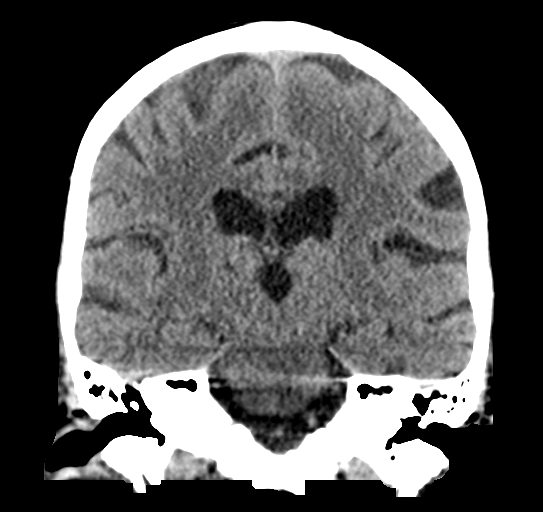
[im 36/64  brain]
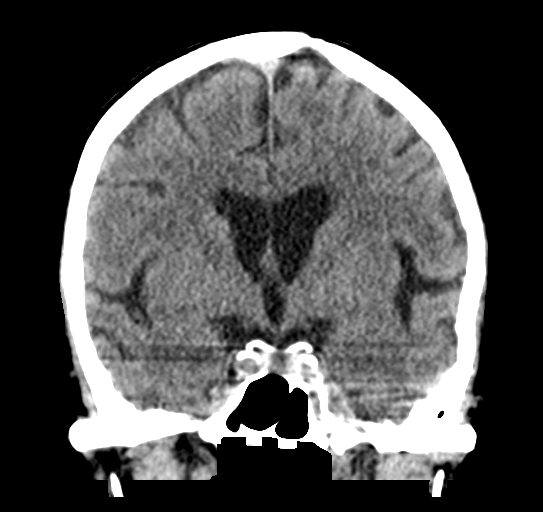

[Series 5: sagittal soft tissue · sagittal · 0.30mm/px · 3 of 54 slices shown]
[im 18/54  brain]
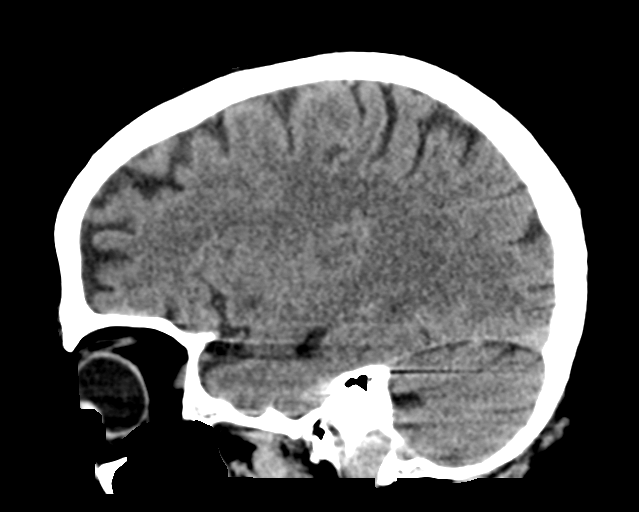
[im 27/54  brain]
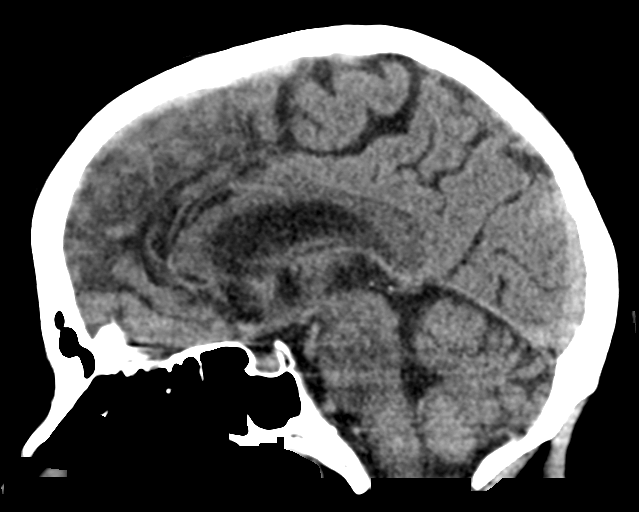
[im 36/54  brain]
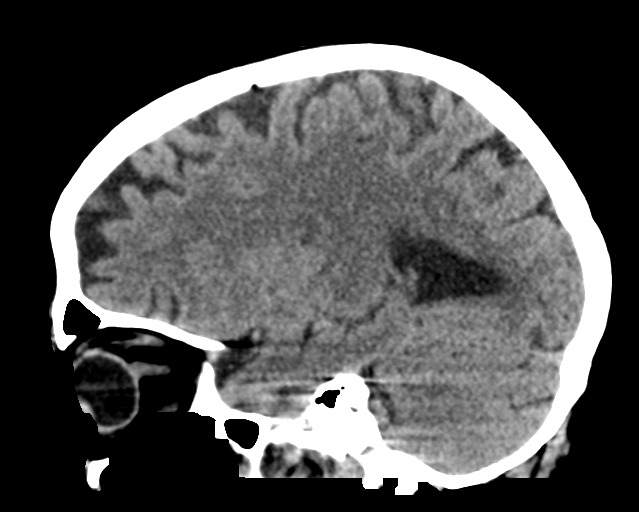

[16 of 47 positions shown; findings below may reference images not displayed]

FINDINGS: Brain: Known acute infarct in the posterior limb left internal
capsule. There pre-existing lacunar infarcts in the deep gray nuclei
and left cerebellum. No hemorrhage, hydrocephalus, or collection.

Vascular: Atherosclerotic calcification

Skull: Negative

Sinuses/Orbits: Negative
IMPRESSION: 1. Recent left internal capsule infarct without visible progression.
2. Chronic small-vessel ischemic injury.

## 2022-09-07 ENCOUNTER — Emergency Department: Payer: Medicare HMO

## 2022-09-07 ENCOUNTER — Other Ambulatory Visit: Payer: Self-pay

## 2022-09-07 DIAGNOSIS — F0390 Unspecified dementia without behavioral disturbance: Secondary | ICD-10-CM | POA: Diagnosis not present

## 2022-09-07 DIAGNOSIS — Z8673 Personal history of transient ischemic attack (TIA), and cerebral infarction without residual deficits: Secondary | ICD-10-CM | POA: Diagnosis not present

## 2022-09-07 DIAGNOSIS — I10 Essential (primary) hypertension: Secondary | ICD-10-CM | POA: Diagnosis not present

## 2022-09-07 DIAGNOSIS — M25552 Pain in left hip: Secondary | ICD-10-CM | POA: Diagnosis not present

## 2022-09-07 DIAGNOSIS — J00 Acute nasopharyngitis [common cold]: Secondary | ICD-10-CM | POA: Diagnosis present

## 2022-09-07 DIAGNOSIS — M25511 Pain in right shoulder: Secondary | ICD-10-CM | POA: Diagnosis not present

## 2022-09-07 DIAGNOSIS — U071 COVID-19: Secondary | ICD-10-CM | POA: Insufficient documentation

## 2022-09-07 LAB — CBC WITH DIFFERENTIAL/PLATELET
Abs Immature Granulocytes: 0.03 10*3/uL (ref 0.00–0.07)
Basophils Absolute: 0 10*3/uL (ref 0.0–0.1)
Basophils Relative: 0 %
Eosinophils Absolute: 0 10*3/uL (ref 0.0–0.5)
Eosinophils Relative: 0 %
HCT: 41.6 % (ref 36.0–46.0)
Hemoglobin: 14 g/dL (ref 12.0–15.0)
Immature Granulocytes: 0 %
Lymphocytes Relative: 5 %
Lymphs Abs: 0.4 10*3/uL — ABNORMAL LOW (ref 0.7–4.0)
MCH: 28.4 pg (ref 26.0–34.0)
MCHC: 33.7 g/dL (ref 30.0–36.0)
MCV: 84.4 fL (ref 80.0–100.0)
Monocytes Absolute: 0.6 10*3/uL (ref 0.1–1.0)
Monocytes Relative: 7 %
Neutro Abs: 7 10*3/uL (ref 1.7–7.7)
Neutrophils Relative %: 88 %
Platelets: 237 10*3/uL (ref 150–400)
RBC: 4.93 MIL/uL (ref 3.87–5.11)
RDW: 13.4 % (ref 11.5–15.5)
WBC: 8.1 10*3/uL (ref 4.0–10.5)
nRBC: 0 % (ref 0.0–0.2)

## 2022-09-07 LAB — LACTIC ACID, PLASMA: Lactic Acid, Venous: 1.4 mmol/L (ref 0.5–1.9)

## 2022-09-07 LAB — COMPREHENSIVE METABOLIC PANEL
ALT: 21 U/L (ref 0–44)
AST: 29 U/L (ref 15–41)
Albumin: 4 g/dL (ref 3.5–5.0)
Alkaline Phosphatase: 81 U/L (ref 38–126)
Anion gap: 10 (ref 5–15)
BUN: 15 mg/dL (ref 8–23)
CO2: 22 mmol/L (ref 22–32)
Calcium: 8.4 mg/dL — ABNORMAL LOW (ref 8.9–10.3)
Chloride: 105 mmol/L (ref 98–111)
Creatinine, Ser: 0.79 mg/dL (ref 0.44–1.00)
GFR, Estimated: >60 mL/min (ref >60)
Glucose, Bld: 112 mg/dL — ABNORMAL HIGH (ref 70–99)
Potassium: 3.2 mmol/L — ABNORMAL LOW (ref 3.5–5.1)
Sodium: 137 mmol/L (ref 135–145)
Total Bilirubin: 0.6 mg/dL (ref 0.3–1.2)
Total Protein: 7.4 g/dL (ref 6.5–8.1)

## 2022-09-07 LAB — SARS CORONAVIRUS 2 BY RT PCR: SARS Coronavirus 2 by RT PCR: POSITIVE — AB

## 2022-09-07 MED ORDER — ACETAMINOPHEN 325 MG PO TABS
650.0000 mg | ORAL_TABLET | Freq: Once | ORAL | Status: AC
  Administered 2022-09-07: 650 mg via ORAL
  Filled 2022-09-07: qty 2

## 2022-09-07 NOTE — ED Triage Notes (Signed)
Pt arrives with her daughter who is reporting the pt had 3 falls at home today. Daughter says the patient is weaker than normal today (usually is unsteady). Denies blood thinners, or LOC. She was c/o pain in the left hip, right shoulder and her neck. Pt was unaware she had a fever, denies any cold symptoms. Per daughter, does not seem anymore confused than she normally is.

## 2022-09-08 ENCOUNTER — Emergency Department
Admission: EM | Admit: 2022-09-08 | Discharge: 2022-09-08 | Disposition: A | Discharge status: Home / Self Care | Payer: Medicare HMO | Attending: Emergency Medicine | Admitting: Emergency Medicine

## 2022-09-08 DIAGNOSIS — U071 COVID-19: Secondary | ICD-10-CM

## 2022-09-08 NOTE — ED Provider Notes (Signed)
[facility] Provider Note    Event Date/Time   First MD Initiated Contact with Patient 09/08/22 [phone]     (approximate)   History   No chief complaint on file.   HPI  Rachel [name] is a 67 y.o. female who presents to the ED for evaluation of No chief complaint on file.   I reviewed admission from 2020.  No more recent information in our system.  History of HTN and hep C.  Admitted for stroke  Patient presents with her daughter and grandson for evaluation of a fall today.  She is disoriented at baseline from dementia and lives at home with family.  She reportedly had 2 or 3 falls today at home, reporting right shoulder and left hip pain since that time.  Witnessed and no apparent syncope or seizure activity.  Family reports that patient has been more weak than normal the past few days.  Patient is complaining of a "cold."   Physical Exam   Triage Vital Signs: ED Triage Vitals  Encounter Vitals Group     BP 09/07/22 2053 (!) 186/89     Systolic BP Percentile --      Diastolic BP Percentile --      Pulse Rate 09/07/22 2053 (!) 104     Resp 09/07/22 2053 18     Temp 09/07/22 2053 (!) 101.6 F (38.7 C)     Temp Source 09/07/22 2053 Oral     SpO2 09/07/22 2053 98 %     Weight --      Height --      Head Circumference --      Peak Flow --      Pain Score 09/07/22 2058 6     Pain Loc --      Pain Education --      Exclude from Growth Chart --     Most recent vital signs: Vitals:   09/07/22 2053 09/08/22 0244  BP: (!) 186/89   Pulse: (!) 104 81  Resp: 18   Temp: (!) 101.6 F (38.7 C) 99.2 F (37.3 C)  SpO2: 98% 99%    General: Awake, no distress.  Pleasantly disoriented CV:  Good peripheral perfusion.  Resp:  Normal effort.  Abd:  No distention.  MSK:  No deformity noted.  No evidence of open injury or clear signs of trauma. Neuro:  No focal deficits appreciated. Other:     ED Results / Procedures / Treatments   Labs (all labs  ordered are listed, but only abnormal results are displayed) Labs Reviewed  SARS CORONAVIRUS 2 BY RT PCR - Abnormal; Notable for the following components:      Result Value   SARS Coronavirus 2 by RT PCR POSITIVE (*)    All other components within normal limits  COMPREHENSIVE METABOLIC PANEL - Abnormal; Notable for the following components:   Potassium 3.2 (*)    Glucose, Bld 112 (*)    Calcium 8.4 (*)    All other components within normal limits  URINALYSIS, ROUTINE W REFLEX MICROSCOPIC - Abnormal; Notable for the following components:   Color, Urine YELLOW (*)    APPearance HAZY (*)    Hgb urine dipstick SMALL (*)    Leukocytes,Ua TRACE (*)    Bacteria, UA RARE (*)    All other components within normal limits  CBC WITH DIFFERENTIAL/PLATELET - Abnormal; Notable for the following components:   Lymphs Abs 0.4 (*)    All other components within  normal limits  CULTURE, BLOOD (ROUTINE X 2)  CULTURE, BLOOD (ROUTINE X 2)  LACTIC ACID, PLASMA  LACTIC ACID, PLASMA  CBC WITH DIFFERENTIAL/PLATELET    EKG Sinus rhythm with a rate of 104 bpm.  Normal axis and intervals.  Nonspecific ST changes without STEMI.  RADIOLOGY CT head interpreted by me without evidence of acute intracranial pathology CT cervical spine interpreted by me without evidence of fracture or dislocation CXR interpreted by me without evidence of acute cardiopulmonary pathology. Plain film of the left hip and right shoulder interpreted by me without evidence of fracture or dislocation  Official radiology report(s): CT Cervical Spine Wo Contrast  Result Date: 09/07/2022 CLINICAL DATA:  Neck trauma (Age >= 65y) Falls at home. EXAM: CT CERVICAL SPINE WITHOUT CONTRAST TECHNIQUE: Multidetector CT imaging of the cervical spine was performed without intravenous contrast. Multiplanar CT image reconstructions were also generated. RADIATION DOSE REDUCTION: This exam was performed according to the departmental dose-optimization  program which includes automated exposure control, adjustment of the mA and/or kV according to patient size and/or use of iterative reconstruction technique. COMPARISON:  12/03/2006 FINDINGS: Alignment: Straightening of normal lordosis. No traumatic subluxation. Skull base and vertebrae: No acute fracture. Vertebral body heights are maintained. The dens and skull base are intact. Soft tissues and spinal canal: No prevertebral fluid or swelling. No visible canal hematoma. Disc levels: Moderate diffuse degenerative disc disease. No high-grade canal stenosis. Upper chest: Emphysema.  No acute findings. Other: None. IMPRESSION: 1. Straightening of normal lordosis may be due to positioning or muscle spasm. No acute fracture or traumatic subluxation. 2. Moderate diffuse degenerative disc disease. Electronically Signed   By: Narda Rutherford M.D.   On: 09/07/2022 22:18   CT HEAD WO CONTRAST ( )  Result Date: 09/07/2022 CLINICAL DATA:  Head trauma, minor (Age >= 65y) Falls at home. EXAM: CT HEAD WITHOUT CONTRAST TECHNIQUE: Contiguous axial images were obtained from the base of the skull through the vertex without intravenous contrast. RADIATION DOSE REDUCTION: This exam was performed according to the departmental dose-optimization program which includes automated exposure control, adjustment of the mA and/or kV according to patient size and/or use of iterative reconstruction technique. COMPARISON:  Head CT 06/20/2018 FINDINGS: Brain: No intracranial hemorrhage, mass effect, or midline shift. Mild generalized atrophy and chronic small vessel ischemia. No hydrocephalus. The basilar cisterns are patent. Remote lacunar infarct in the left pons, new from prior exam but not acute. No evidence of territorial infarct or acute ischemia. No extra-axial or intracranial fluid collection. Vascular: Atherosclerosis of skullbase vasculature without hyperdense vessel or abnormal calcification. Skull: No fracture or focal lesion.  Sinuses/Orbits: No acute findings. Mucosal thickening throughout the ethmoid air cells. No mastoid effusion. Other: None. IMPRESSION: 1. No acute intracranial abnormality. No skull fracture. 2. Mild atrophy and chronic small vessel ischemia. Remote lacunar infarct in the left pons. Electronically Signed   By: Narda Rutherford M.D.   On: 09/07/2022 22:15   DG Chest 1 View  Result Date: 09/07/2022 CLINICAL DATA:  Fever.  Fall today. EXAM: CHEST  1 VIEW COMPARISON:  Radiograph 06/17/2018. FINDINGS: The heart is normal in size. Stable mediastinal contours. Left hilar prominence is unchanged. Aortic atherosclerosis. Chronic interstitial coarsening without focal airspace disease. Mild biapical pleuroparenchymal scarring. No pleural fluid or pneumothorax. No acute osseous abnormalities are seen. IMPRESSION: 1. No acute chest findings. 2. Chronic interstitial coarsening. Electronically Signed   By: Narda Rutherford M.D.   On: 09/07/2022 21:51   DG Hip Unilat W or Wo Pelvis  2-3 Views Left  Result Date: 09/07/2022 CLINICAL DATA:  Falls today, left hip pain. EXAM: DG HIP (WITH OR WITHOUT PELVIS) 2-3V LEFT COMPARISON:  None Available. FINDINGS: No acute fracture of the pelvis or left hip. The femoral head is well seated in the acetabulum. The pubic rami are intact. Pubic symphysis and sacroiliac joints are congruent. The bones are subjectively under mineralized. No erosive change. Prominent vascular calcifications. IMPRESSION: No acute fracture of the pelvis or left hip. Electronically Signed   By: Narda Rutherford M.D.   On: 09/07/2022 21:50   DG Shoulder Right  Result Date: 09/07/2022 CLINICAL DATA:  Right shoulder pain, falls today. EXAM: RIGHT SHOULDER - 2+ VIEW COMPARISON:  Remote radiograph 03/24/2007 FINDINGS: There is no evidence of fracture or dislocation. The bones are subjectively under mineralized. Minor glenohumeral degenerative change. No erosions. Soft tissues are unremarkable. No fracture of included  ribs. IMPRESSION: No fracture or dislocation of the right shoulder. Minor glenohumeral degenerative change. Electronically Signed   By: Narda Rutherford M.D.   On: 09/07/2022 21:49    PROCEDURES and INTERVENTIONS:  Procedures  Medications  acetaminophen (TYLENOL) tablet 650 mg (650 mg Oral Given 09/07/22 2118)     IMPRESSION / MDM / ASSESSMENT AND PLAN / ED COURSE  I reviewed the triage vital signs and the nursing notes.  Differential diagnosis includes, but is not limited to, fracture, dislocation, stroke, seizure, metabolic encephalopathy, sepsis  {Patient presents with symptoms of an acute illness or injury that is potentially life-threatening.  Patient presents from home after multiple falls, found to have fever likely due to COVID, but a fairly benign workup and suitable for outpatient management.  Febrile and tachycardic, resolving with Tylenol.  Reassuring workup with normal WBC, lactic acid.  Reassuring metabolic panel.  No clear signs of bacterial etiology of her symptoms with a clear CXR and urine.  Tested positive for COVID is likely etiology of her fever and increased weakness from her baseline.  No indications for antibiotics.  Reassuring imaging.  After antipyretics, she gets up and ambulates around her baseline and family is comfortable taking her home, which I think is reasonable.  We discussed return precautions.      FINAL CLINICAL IMPRESSION(S) / ED DIAGNOSES   Final diagnoses:  COVID-19     Rx / DC Orders   ED Discharge Orders     None        Note:  This document was prepared using Dragon voice recognition software and may include unintentional dictation errors.   Delton [place], MD 09/08/22 [phone]

## 2022-09-08 NOTE — Discharge Instructions (Addendum)
Use Tylenol for pain and fevers.  Up to 1000 mg per dose, up to 4 times per day.  Do not take more than 4000 mg of Tylenol/acetaminophen within 24 hours..  

## 2022-09-08 NOTE — ED Notes (Signed)
Pt ambulated to the commode and back without assistance.

## 2023-04-05 ENCOUNTER — Inpatient Hospital Stay (HOSPITAL_COMMUNITY)

## 2023-04-05 ENCOUNTER — Emergency Department
Admission: EM | Admit: 2023-04-05 | Discharge: 2023-04-05 | Disposition: A | Discharge status: Other Acute Inpatient Hospital | Attending: Student in an Organized Health Care Education/Training Program | Admitting: Student in an Organized Health Care Education/Training Program

## 2023-04-05 ENCOUNTER — Encounter (HOSPITAL_COMMUNITY): Admission: EM | Disposition: A | Discharge status: Skilled Nursing Facility | Payer: Self-pay | Attending: Student

## 2023-04-05 ENCOUNTER — Inpatient Hospital Stay (HOSPITAL_COMMUNITY)
Admission: EM | Admit: 2023-04-05 | Discharge: 2023-04-09 | DRG: 494 | Disposition: A | Discharge status: Skilled Nursing Facility | Source: Other Acute Inpatient Hospital | Attending: Student | Admitting: Student

## 2023-04-05 ENCOUNTER — Encounter: Payer: Self-pay | Admitting: *Deleted

## 2023-04-05 ENCOUNTER — Inpatient Hospital Stay (HOSPITAL_COMMUNITY): Admitting: Anesthesiology

## 2023-04-05 ENCOUNTER — Other Ambulatory Visit: Payer: Self-pay

## 2023-04-05 ENCOUNTER — Emergency Department

## 2023-04-05 ENCOUNTER — Encounter (HOSPITAL_COMMUNITY): Payer: Self-pay

## 2023-04-05 DIAGNOSIS — I10 Essential (primary) hypertension: Secondary | ICD-10-CM | POA: Diagnosis present

## 2023-04-05 DIAGNOSIS — W19XXXA Unspecified fall, initial encounter: Secondary | ICD-10-CM

## 2023-04-05 DIAGNOSIS — I679 Cerebrovascular disease, unspecified: Secondary | ICD-10-CM | POA: Diagnosis not present

## 2023-04-05 DIAGNOSIS — S82301A Unspecified fracture of lower end of right tibia, initial encounter for closed fracture: Secondary | ICD-10-CM | POA: Insufficient documentation

## 2023-04-05 DIAGNOSIS — Z472 Encounter for removal of internal fixation device: Secondary | ICD-10-CM | POA: Diagnosis not present

## 2023-04-05 DIAGNOSIS — Z880 Allergy status to penicillin: Secondary | ICD-10-CM

## 2023-04-05 DIAGNOSIS — R451 Restlessness and agitation: Secondary | ICD-10-CM | POA: Diagnosis not present

## 2023-04-05 DIAGNOSIS — Z8619 Personal history of other infectious and parasitic diseases: Secondary | ICD-10-CM

## 2023-04-05 DIAGNOSIS — W1830XA Fall on same level, unspecified, initial encounter: Secondary | ICD-10-CM | POA: Diagnosis present

## 2023-04-05 DIAGNOSIS — M79604 Pain in right leg: Secondary | ICD-10-CM | POA: Insufficient documentation

## 2023-04-05 DIAGNOSIS — Z881 Allergy status to other antibiotic agents status: Secondary | ICD-10-CM | POA: Diagnosis not present

## 2023-04-05 DIAGNOSIS — Y92009 Unspecified place in unspecified non-institutional (private) residence as the place of occurrence of the external cause: Secondary | ICD-10-CM

## 2023-04-05 DIAGNOSIS — F17210 Nicotine dependence, cigarettes, uncomplicated: Secondary | ICD-10-CM

## 2023-04-05 DIAGNOSIS — W010XXA Fall on same level from slipping, tripping and stumbling without subsequent striking against object, initial encounter: Secondary | ICD-10-CM | POA: Diagnosis not present

## 2023-04-05 DIAGNOSIS — S82831A Other fracture of upper and lower end of right fibula, initial encounter for closed fracture: Secondary | ICD-10-CM | POA: Diagnosis present

## 2023-04-05 DIAGNOSIS — R410 Disorientation, unspecified: Secondary | ICD-10-CM | POA: Diagnosis present

## 2023-04-05 DIAGNOSIS — S8991XA Unspecified injury of right lower leg, initial encounter: Secondary | ICD-10-CM | POA: Diagnosis present

## 2023-04-05 DIAGNOSIS — S82261A Displaced segmental fracture of shaft of right tibia, initial encounter for closed fracture: Principal | ICD-10-CM | POA: Diagnosis present

## 2023-04-05 HISTORY — PX: EXTERNAL FIXATION LEG: SHX1549

## 2023-04-05 LAB — CBC WITH DIFFERENTIAL/PLATELET
Abs Immature Granulocytes: 0.03 10*3/uL (ref 0.00–0.07)
Basophils Absolute: 0 10*3/uL (ref 0.0–0.1)
Basophils Relative: 0 %
Eosinophils Absolute: 0 10*3/uL (ref 0.0–0.5)
Eosinophils Relative: 0 %
HCT: 41.6 % (ref 36.0–46.0)
Hemoglobin: 13.7 g/dL (ref 12.0–15.0)
Immature Granulocytes: 0 %
Lymphocytes Relative: 7 %
Lymphs Abs: 0.7 10*3/uL (ref 0.7–4.0)
MCH: 28.2 pg (ref 26.0–34.0)
MCHC: 32.9 g/dL (ref 30.0–36.0)
MCV: 85.6 fL (ref 80.0–100.0)
Monocytes Absolute: 0.4 10*3/uL (ref 0.1–1.0)
Monocytes Relative: 4 %
Neutro Abs: 8.8 10*3/uL — ABNORMAL HIGH (ref 1.7–7.7)
Neutrophils Relative %: 89 %
Platelets: 273 10*3/uL (ref 150–400)
RBC: 4.86 MIL/uL (ref 3.87–5.11)
RDW: 13.3 % (ref 11.5–15.5)
WBC: 10 10*3/uL (ref 4.0–10.5)
nRBC: 0 % (ref 0.0–0.2)

## 2023-04-05 LAB — BASIC METABOLIC PANEL
Anion gap: 11 (ref 5–15)
BUN: 13 mg/dL (ref 8–23)
CO2: 25 mmol/L (ref 22–32)
Calcium: 8.5 mg/dL — ABNORMAL LOW (ref 8.9–10.3)
Chloride: 102 mmol/L (ref 98–111)
Creatinine, Ser: 0.66 mg/dL (ref 0.44–1.00)
GFR, Estimated: >60 mL/min (ref >60)
Glucose, Bld: 111 mg/dL — ABNORMAL HIGH (ref 70–99)
Potassium: 3.4 mmol/L — ABNORMAL LOW (ref 3.5–5.1)
Sodium: 138 mmol/L (ref 135–145)

## 2023-04-05 LAB — SURGICAL PCR SCREEN
MRSA, PCR: NEGATIVE
Staphylococcus aureus: NEGATIVE

## 2023-04-05 SURGERY — EXTERNAL FIXATION, LOWER EXTREMITY
Anesthesia: General | Laterality: Right

## 2023-04-05 MED ORDER — PHENYLEPHRINE 80 MCG/ML (10ML) SYRINGE FOR IV PUSH (FOR BLOOD PRESSURE SUPPORT)
PREFILLED_SYRINGE | INTRAVENOUS | Status: DC | PRN
  Administered 2023-04-05: 80 ug via INTRAVENOUS

## 2023-04-05 MED ORDER — HYDROCHLOROTHIAZIDE 25 MG PO TABS
25.0000 mg | ORAL_TABLET | Freq: Every day | ORAL | Status: DC
  Administered 2023-04-06 – 2023-04-09 (×3): 25 mg via ORAL
  Filled 2023-04-05 (×3): qty 1

## 2023-04-05 MED ORDER — DEXAMETHASONE SODIUM PHOSPHATE 10 MG/ML IJ SOLN
INTRAMUSCULAR | Status: DC | PRN
  Administered 2023-04-05: 5 mg via INTRAVENOUS

## 2023-04-05 MED ORDER — POLYETHYLENE GLYCOL 3350 17 G PO PACK
17.0000 g | PACK | Freq: Every day | ORAL | Status: DC
  Administered 2023-04-06 – 2023-04-09 (×2): 17 g via ORAL
  Filled 2023-04-05 (×4): qty 1

## 2023-04-05 MED ORDER — ACETAMINOPHEN 500 MG PO TABS
1000.0000 mg | ORAL_TABLET | Freq: Three times a day (TID) | ORAL | Status: DC
  Administered 2023-04-06 – 2023-04-09 (×7): 1000 mg via ORAL
  Filled 2023-04-05 (×10): qty 2

## 2023-04-05 MED ORDER — ENOXAPARIN SODIUM 40 MG/0.4ML IJ SOSY
40.0000 mg | PREFILLED_SYRINGE | INTRAMUSCULAR | Status: DC
  Administered 2023-04-06: 40 mg via SUBCUTANEOUS
  Filled 2023-04-05 (×2): qty 0.4

## 2023-04-05 MED ORDER — OXYCODONE HCL 5 MG PO TABS
5.0000 mg | ORAL_TABLET | ORAL | Status: DC | PRN
  Administered 2023-04-06: 5 mg via ORAL
  Administered 2023-04-06: 10 mg via ORAL
  Administered 2023-04-06: 5 mg via ORAL
  Administered 2023-04-07 – 2023-04-08 (×3): 10 mg via ORAL
  Administered 2023-04-08: 5 mg via ORAL
  Administered 2023-04-08 (×2): 10 mg via ORAL
  Filled 2023-04-05: qty 2
  Filled 2023-04-05: qty 1
  Filled 2023-04-05 (×2): qty 2
  Filled 2023-04-05: qty 1
  Filled 2023-04-05 (×4): qty 2

## 2023-04-05 MED ORDER — TRANEXAMIC ACID-NACL 1000-0.7 MG/100ML-% IV SOLN: 1000.0000 mg | INTRAVENOUS | Status: AC

## 2023-04-05 MED ORDER — PROPOFOL 10 MG/ML IV BOLUS
INTRAVENOUS | Status: AC
  Filled 2023-04-05: qty 20

## 2023-04-05 MED ORDER — LIDOCAINE 2% (20 MG/ML) 5 ML SYRINGE
INTRAMUSCULAR | Status: DC | PRN
  Administered 2023-04-05: 60 mg via INTRAVENOUS

## 2023-04-05 MED ORDER — FENTANYL CITRATE PF 50 MCG/ML IJ SOSY
50.0000 ug | PREFILLED_SYRINGE | INTRAMUSCULAR | Status: DC | PRN
  Administered 2023-04-05 (×3): 50 ug via INTRAVENOUS
  Filled 2023-04-05 (×3): qty 1

## 2023-04-05 MED ORDER — PHENYLEPHRINE 80 MCG/ML (10ML) SYRINGE FOR IV PUSH (FOR BLOOD PRESSURE SUPPORT)
PREFILLED_SYRINGE | INTRAVENOUS | Status: AC
  Filled 2023-04-05: qty 10

## 2023-04-05 MED ORDER — CEFAZOLIN SODIUM-DEXTROSE 2-4 GM/100ML-% IV SOLN
2.0000 g | INTRAVENOUS | Status: AC
  Administered 2023-04-05: 2 g via INTRAVENOUS

## 2023-04-05 MED ORDER — HYDROMORPHONE HCL 1 MG/ML IJ SOLN: 0.2500 mg | INTRAMUSCULAR | Status: DC | PRN

## 2023-04-05 MED ORDER — ROCURONIUM BROMIDE 10 MG/ML (PF) SYRINGE
PREFILLED_SYRINGE | INTRAVENOUS | Status: AC
  Filled 2023-04-05: qty 10

## 2023-04-05 MED ORDER — FENTANYL CITRATE (PF) 250 MCG/5ML IJ SOLN
INTRAMUSCULAR | Status: AC
  Filled 2023-04-05: qty 5

## 2023-04-05 MED ORDER — 0.9 % SODIUM CHLORIDE (POUR BTL) OPTIME
TOPICAL | Status: DC | PRN
  Administered 2023-04-05: 1000 mL

## 2023-04-05 MED ORDER — ONDANSETRON HCL 4 MG/2ML IJ SOLN
INTRAMUSCULAR | Status: AC
  Filled 2023-04-05: qty 2

## 2023-04-05 MED ORDER — DEXAMETHASONE SODIUM PHOSPHATE 10 MG/ML IJ SOLN
INTRAMUSCULAR | Status: AC
  Filled 2023-04-05: qty 1

## 2023-04-05 MED ORDER — CEFAZOLIN SODIUM 1 G IJ SOLR
INTRAMUSCULAR | Status: AC
  Filled 2023-04-05: qty 20

## 2023-04-05 MED ORDER — ONDANSETRON HCL 4 MG/2ML IJ SOLN
INTRAMUSCULAR | Status: DC | PRN
  Administered 2023-04-05: 4 mg via INTRAVENOUS

## 2023-04-05 MED ORDER — FENTANYL CITRATE (PF) 250 MCG/5ML IJ SOLN
INTRAMUSCULAR | Status: DC | PRN
  Administered 2023-04-05 (×3): 50 ug via INTRAVENOUS

## 2023-04-05 MED ORDER — PROPOFOL 10 MG/ML IV BOLUS
INTRAVENOUS | Status: DC | PRN
  Administered 2023-04-05: 60 mg via INTRAVENOUS
  Administered 2023-04-05: 90 mg via INTRAVENOUS

## 2023-04-05 MED ORDER — MORPHINE SULFATE (PF) 4 MG/ML IV SOLN
4.0000 mg | Freq: Once | INTRAVENOUS | Status: AC
  Administered 2023-04-05: 4 mg via INTRAVENOUS
  Filled 2023-04-05: qty 1

## 2023-04-05 MED ORDER — SENNA 8.6 MG PO TABS
1.0000 | ORAL_TABLET | Freq: Two times a day (BID) | ORAL | Status: DC
  Administered 2023-04-06 – 2023-04-09 (×5): 8.6 mg via ORAL
  Filled 2023-04-05 (×6): qty 1

## 2023-04-05 MED ORDER — LIDOCAINE 2% (20 MG/ML) 5 ML SYRINGE
INTRAMUSCULAR | Status: AC
  Filled 2023-04-05: qty 5

## 2023-04-05 MED ORDER — DROPERIDOL 2.5 MG/ML IJ SOLN: 0.6250 mg | Freq: Once | INTRAMUSCULAR | Status: DC | PRN

## 2023-04-05 MED ORDER — ACETAMINOPHEN 10 MG/ML IV SOLN
INTRAVENOUS | Status: DC | PRN
  Administered 2023-04-05: 1000 mg via INTRAVENOUS

## 2023-04-05 MED ORDER — LACTATED RINGERS IV SOLN: INTRAVENOUS | Status: DC | PRN

## 2023-04-05 MED ORDER — METHOCARBAMOL 500 MG PO TABS
500.0000 mg | ORAL_TABLET | Freq: Four times a day (QID) | ORAL | Status: DC
  Administered 2023-04-06 – 2023-04-09 (×10): 500 mg via ORAL
  Filled 2023-04-05 (×11): qty 1

## 2023-04-05 MED ORDER — SUCCINYLCHOLINE CHLORIDE 200 MG/10ML IV SOSY
PREFILLED_SYRINGE | INTRAVENOUS | Status: DC | PRN
  Administered 2023-04-05: 70 mg via INTRAVENOUS

## 2023-04-05 SURGICAL SUPPLY — 42 items
BAG COUNTER SPONGE SURGICOUNT (BAG) ×1 IMPLANT
BANDAGE ESMARK 6X9 LF (GAUZE/BANDAGES/DRESSINGS) ×1 IMPLANT
BNDG ELASTIC 4X5.8 VLCR STR LF (GAUZE/BANDAGES/DRESSINGS) ×1 IMPLANT
BNDG ELASTIC 6INX 5YD STR LF (GAUZE/BANDAGES/DRESSINGS) ×1 IMPLANT
BNDG ELASTIC 6X10 VLCR STRL LF (GAUZE/BANDAGES/DRESSINGS) IMPLANT
BNDG ESMARK 6X9 LF (GAUZE/BANDAGES/DRESSINGS) ×1 IMPLANT
BNDG GAUZE DERMACEA FLUFF 4 (GAUZE/BANDAGES/DRESSINGS) ×2 IMPLANT
BNDG STRETCH GAUZE 3IN X12FT (GAUZE/BANDAGES/DRESSINGS) IMPLANT
BRUSH SCRUB EZ PLAIN DRY (MISCELLANEOUS) ×2 IMPLANT
COVER SURGICAL LIGHT HANDLE (MISCELLANEOUS) ×2 IMPLANT
DRAPE C-ARM 42X72 X-RAY (DRAPES) IMPLANT
DRAPE C-ARMOR (DRAPES) ×1 IMPLANT
DRAPE U-SHAPE 47X51 STRL (DRAPES) ×1 IMPLANT
DRSG ADAPTIC 3X8 NADH LF (GAUZE/BANDAGES/DRESSINGS) ×1 IMPLANT
ELECT REM PT RETURN 9FT ADLT (ELECTROSURGICAL) ×1 IMPLANT
ELECTRODE REM PT RTRN 9FT ADLT (ELECTROSURGICAL) ×1 IMPLANT
GAUZE SPONGE 4X4 12PLY STRL (GAUZE/BANDAGES/DRESSINGS) ×1 IMPLANT
GAUZE XEROFORM 5X9 LF (GAUZE/BANDAGES/DRESSINGS) IMPLANT
GLOVE BIO SURGEON STRL SZ7.5 (GLOVE) ×1 IMPLANT
GLOVE BIO SURGEON STRL SZ8 (GLOVE) ×1 IMPLANT
GLOVE BIOGEL PI IND STRL 7.5 (GLOVE) ×1 IMPLANT
GLOVE BIOGEL PI IND STRL 8 (GLOVE) ×1 IMPLANT
GLOVE SURG ORTHO LTX SZ7.5 (GLOVE) ×2 IMPLANT
GOWN STRL REUS W/ TWL LRG LVL3 (GOWN DISPOSABLE) ×2 IMPLANT
GOWN STRL REUS W/ TWL XL LVL3 (GOWN DISPOSABLE) ×1 IMPLANT
KIT BASIN OR (CUSTOM PROCEDURE TRAY) ×1 IMPLANT
KIT TURNOVER KIT B (KITS) ×1 IMPLANT
MANIFOLD NEPTUNE II (INSTRUMENTS) ×1 IMPLANT
NS IRRIG 1000ML POUR BTL (IV SOLUTION) ×1 IMPLANT
PACK ORTHO EXTREMITY (CUSTOM PROCEDURE TRAY) ×1 IMPLANT
PAD ARMBOARD 7.5X6 YLW CONV (MISCELLANEOUS) ×2 IMPLANT
PADDING CAST COTTON 6X4 STRL (CAST SUPPLIES) ×2 IMPLANT
PIN APEX 5X150 (EXFIX) IMPLANT
PIN CLAMP 5H 30DEG POST (EXFIX) IMPLANT
PIN TRANSFIX 615X300 EXFIX (EXFIX) IMPLANT
ROD CNCT 400X11XNS LF HFMN (EXFIX) IMPLANT
ROD TO ROD COUPLING EXFIX (EXFIX) IMPLANT
SPONGE T-LAP 18X18 ~~LOC~~+RFID (SPONGE) ×1 IMPLANT
STOCKINETTE IMPERVIOUS LG (DRAPES) IMPLANT
TOWEL GREEN STERILE (TOWEL DISPOSABLE) ×2 IMPLANT
TOWEL GREEN STERILE FF (TOWEL DISPOSABLE) ×2 IMPLANT
UNDERPAD 30X36 HEAVY ABSORB (UNDERPADS AND DIAPERS) ×1 IMPLANT

## 2023-04-05 NOTE — ED Provider Notes (Signed)
 [facility] Provider Note    Event Date/Time   First MD Initiated Contact with Patient 04/05/23 1413     (approximate)   History   Fall   HPI  Karime A Soltau is a 68 y.o. female presents to the ER from home after mechanical fall where she got tripped up and fell tripping over her right leg.  Did not hit her head.  No neck pain.  No LOC.  Unable to bear weight on the right leg.  Had obvious deformity when EMS arrived she was unable to stand.  No report of any back pain.     Physical Exam   Triage Vital Signs: ED Triage Vitals  Encounter Vitals Group     BP 04/05/23 1419 (!) 243/99     Systolic BP Percentile --      Diastolic BP Percentile --      Pulse Rate 04/05/23 1419 83     Resp 04/05/23 1419 (!) 24     Temp 04/05/23 1419 97.9 F (36.6 C)     Temp src --      SpO2 04/05/23 1419 100 %     Weight --      Height --      Head Circumference --      Peak Flow --      Pain Score 04/05/23 1420 10     Pain Loc --      Pain Education --      Exclude from Growth Chart --     Most recent vital signs: Vitals:   04/05/23 1419  BP: (!) 243/99  Pulse: 83  Resp: (!) 24  Temp: 97.9 F (36.6 C)  SpO2: 100%     Constitutional: Alert  Eyes: Conjunctivae are normal.  Head: Atraumatic. Nose: No congestion/rhinnorhea. Mouth/Throat: Mucous membranes are moist.   Neck: Painless ROM.  Cardiovascular:   Good peripheral circulation. Respiratory: Normal respiratory effort.  No retractions.  Gastrointestinal: Soft and nontender.  Musculoskeletal: Rotational deformity of the right lower extremity.  Neurovascularly intact.  Compartments are soft.  Strong PT and DP pulses.  Brisk cap refill. Neurologic:  MAE spontaneously. No gross focal neurologic deficits are appreciated.  Skin:  Skin is warm, dry and intact. No rash noted. Psychiatric: Mood and affect are normal. Speech and behavior are normal.    ED Results / Procedures / Treatments   Labs (all  labs ordered are listed, but only abnormal results are displayed) Labs Reviewed  CBC WITH DIFFERENTIAL/PLATELET  BASIC METABOLIC PANEL     F2   RADIOLOGY Please see ED Course for my review and interpretation.  I personally reviewed all radiographic images ordered to evaluate for the above acute complaints and reviewed radiology reports and findings.  These findings were personally discussed with the patient.  Please see medical record for radiology report.    PROCEDURES:  Critical Care performed: No  Procedures   MEDICATIONS ORDERED IN ED: Medications  fentaNYL (SUBLIMAZE) injection 50 mcg (50 mcg Intravenous Given 04/05/23 1433)     IMPRESSION / MDM / ASSESSMENT AND PLAN / ED COURSE  I reviewed the triage vital signs and the nursing notes.                              Differential diagnosis includes, but is not limited to, fracture, dislocation, ligamentous injury  Patient presenting to the ER for evaluation of symptoms as described above.  Based on symptoms, risk factors and considered above differential, this presenting complaint could reflect a potentially life-threatening illness therefore the patient will be placed on continuous pulse oximetry and telemetry for monitoring.  Laboratory evaluation will be sent to evaluate for the above complaints.      Clinical Course as of 04/05/23 1509  Mon Apr 05, 2023  1502 Nonsyncopal fall with concern for injury to RLE. No head injury. No AC.  IV pain medication and XR pending.  [SM]    Clinical Course User Index [SM] Corena Herter, MD   Patient will be signed out oncoming physician pending follow-up imaging.  FINAL CLINICAL IMPRESSION(S) / ED DIAGNOSES   Final diagnoses:  Fall, initial encounter     Rx / DC Orders   ED Discharge Orders     None        Note:  This document was prepared using Dragon voice recognition software and may include unintentional dictation errors.    Willy Eddy,  MD 04/05/23 [phone]

## 2023-04-05 NOTE — Op Note (Signed)
 Orthopedic Surgery Operative Report   Procedure: Right leg application of uniplanar external fixation Closed reduction of right tibia fracture with manipulation under anesthesia   Modifier: none   Date of procedure: 04/05/2023   Patient name: Rachel [name]   MRN: [id number]  DOB: 06/24/1955   Surgeon: Willia Craze, MD Assistant: None Pre-operative diagnosis: right displaced segmental tibia fracture Post-operative diagnosis: same as above Findings: right displaced segmental tibia fracture   Specimens: none Anesthesia: general EBL: 15cc Complications: none Pre-incision antibiotic: ancef   Implants:  [employer] Hoffman 3 external fixator 2 Schanz pins 1 transcalcaneal pin 1 pin to pin connector bars x2 Clamps x4    Indication for procedure: Patient is a 68 y.o. female who had a ground-level fall on her way to the bathroom today.  She noted immediate onset of right leg pain and was unable to ambulate.  She was brought to [facility] where she was found to have a right segmental tibia fracture.  She was transferred to [facility] for further management.  On presentation, she was found to have only a right segmental tibia fracture.  This was an isolated injury since she was admitted to orthopedics.  Plan was made for external fixation to improve the reduction, allow for soft tissue rest, and stabilize the fracture while awaiting final fixation with my traumatology colleagues.  The risks of surgery were discussed with the patient and her daughter.  Those risks included but were not limited to: Infection, pin tract loosening, neurovascular injury, DVT/PE, stiffness, death.  After our conversation, both the patient and her daughter elected to proceed with surgery.   Procedure Description: The patient was met in the pre-operative holding area. The patient's identity and consent were verified. The operative site was marked by myself. The patient's remaining  questions about the surgery were answered. The patient was brought back to the operating room. General anesthesia was induced and an endotracheal tube was placed by the anesthesia staff. The patient was transferred to the operating table in the supine position. All bony prominences were well padded. The surgical area was cleansed with alcohol.  Ancef was administered by anesthesia. The patient's skin was then prepped and draped in a standard, sterile fashion. A time out was performed that identified the patient, the procedure, and the operative site. All team members agreed with what was stated in the time out.    Fluoroscopy was brought into visualize the proximal extent of the fracture.  Based off that, a stab incision was made about a handbreadth more proximal than the fracture.  This was at the level of the proximal tibia while getting enough room for a second pin.  A tonsil was used to bluntly dissect down to the level of the tibia.  A Schanz pin that was self drilling and self-tapping then placed over the tibia and drilled bicortically.  A guide was then placed over the pin to estimate the site of a second pin that would fit within a pin to pin clamp.  A stab incision was made at that site even more proximal on the tibia.  A tonsil was used to bluntly dissect down to the level of the tibia.  A Schanz pin that was self drilling and self-tapping was then placed over the tibia and drilled bicortically.  AP and lateral fluoroscopy confirmed satisfactory position of the Schanz pins in the tibia.  Attention was then turned to the calcaneus.  The fluoroscopy machine was brought down to  the lateral position at the foot.  A Glorious Peach was used to mark an area over the calcaneus at the posterior aspect.  A knife used to make a stab incision at that site.  A tonsil was used to dissect bluntly down to the medial calcaneus.  A self drilling and self-tapping trans calcaneal pin was then placed bicortically across the  calcaneus.  As it was coming out the lateral side and the skin was tenting, a knife was used to create a stab incision.  Then fully advanced hands.  A pin to pin clamp was then placed over the tibial Schanz pins.  This was final tightened in place.  Clamps were placed on both the medial and lateral aspect of the calcaneal pain and pin to pin plan.  Bars were then placed between these clamps on both the medial and lateral aspect of the leg.  The proximal clamps were final tightened in place.  A reduction maneuver was performed.  AP and lateral fluoroscopy confirmed satisfactory reduction.  As the reduction was held, the scrub final tightened to the distal clamps.  Final AP and lateral fluoroscopy confirmed satisfactory position of the calcaneus pin, the Schanz pins, and the reduction of both the distal and proximal tibia fractures.   Post-operative plan: The patient will recover in the post-anesthesia care unit and then go to the floor on the orthopedic service. The patient will receive two post-operative doses of ancef. The patient will be nonweightbearing on the right lower extremity.  This will be a staged procedure.  She will eventually undergo definitive fixation with my traumatology colleagues.  The patient will work with physical therapy. The patient's disposition will be determined after definitive fixation is placed.       Willia Craze, MD Orthopedic Surgeon

## 2023-04-05 NOTE — ED Notes (Signed)
 Pt foot is darker, repositioned leg and foot became more pink.  Pulses not found, unable to doppler pulses.  EDP to bedside to doppler pulses, fentanyl given per verbal order and EDP reduced right leg and NT placed splint.  Cap refill 3seconds.

## 2023-04-05 NOTE — Anesthesia Preprocedure Evaluation (Addendum)
 Anesthesia Evaluation  Patient identified by MRN, date of birth, ID band Patient awake    Reviewed: Allergy & Precautions, NPO status , Patient's Chart, lab work & pertinent test results  Airway Mallampati: II  TM Distance: >3 FB Neck ROM: Full    Dental  (+) Edentulous Upper, Poor Dentition, Missing, Dental Advisory Given, Chipped   Pulmonary Current Smoker   Pulmonary exam normal breath sounds clear to auscultation       Cardiovascular hypertension,  Rhythm:Regular Rate:Normal + Systolic murmurs Echo 06/2018  1. The left ventricle has normal systolic function, with an ejection fraction of 55-60%. The cavity size was normal. Left ventricular diastolic Doppler parameters are consistent with impaired relaxation.   2. The right ventricle has normal systolic function. The cavity was normal. There is no increase in right ventricular wall thickness. Unable to estimate RVSP.   3. Negative saline contrast bubble study.      Neuro/Psych  PSYCHIATRIC DISORDERS   Bipolar Disorder   CVA    GI/Hepatic negative GI ROS,,,(+)     substance abuse  marijuana use, Hepatitis -, C  Endo/Other  negative endocrine ROS    Renal/GU negative Renal ROS     Musculoskeletal negative musculoskeletal ROS (+)  narcotic dependent  Abdominal   Peds  Hematology negative hematology ROS (+)   Anesthesia Other Findings   Reproductive/Obstetrics                             Anesthesia Physical Anesthesia Plan  ASA: 3 and emergent  Anesthesia Plan: General   Post-op Pain Management: Ofirmev IV (intra-op)*   Induction: Intravenous  PONV Risk Score and Plan: 2 and Treatment may vary due to age or medical condition, Ondansetron and Dexamethasone  Airway Management Planned: Oral ETT  Additional Equipment:   Intra-op Plan:   Post-operative Plan: Extubation in OR  Informed Consent: I have reviewed the patients History  and Physical, chart, labs and discussed the procedure including the risks, benefits and alternatives for the proposed anesthesia with the patient or authorized representative who has indicated his/her understanding and acceptance.     Dental advisory given  Plan Discussed with: CRNA  Anesthesia Plan Comments:         Anesthesia Quick Evaluation

## 2023-04-05 NOTE — ED Provider Notes (Addendum)
 Care assumed of patient from outgoing provider.  See their note for initial history, exam and plan.  Clinical Course as of 04/05/23 1744  Mon Apr 05, 2023  1502 Nonsyncopal fall with concern for injury to RLE. No head injury. No AC.  IV pain medication and XR pending.  [SM]  1559 Discussed with Dr. Joice Lofts he with orthopedic surgery and recommended transfer to the trauma orthopedic doctors at [facility].  Paging [facility]. [SM]  1616 Discussed the patient's case with orthopedic on-call at [facility] Dr. Christell Constant who reviewed the patient's x-rays, ordered a CT scan of her ankle and accepted the patient in transfer. [SM]    Clinical Course User Index [SM] Corena Herter, MD   .Critical Care  Performed by: Corena Herter, MD Authorized by: Corena Herter, MD   Critical care provider statement:    Critical care time (minutes):  30   Critical care time was exclusive of:  Separately billable procedures and treating other patients   Critical care was necessary to treat or prevent imminent or life-threatening deterioration of the following conditions:  Trauma   Critical care was time spent personally by me on the following activities:  Development of treatment plan with patient or surrogate, discussions with consultants, evaluation of patient's response to treatment, examination of patient, ordering and review of laboratory studies, ordering and review of radiographic studies, ordering and performing treatments and interventions, pulse oximetry, re-evaluation of patient's condition and review of old charts   Care discussed with: accepting provider at another facility   .Ortho Injury Treatment  Date/Time: 04/05/2023 5:43 PM  Performed by: Corena Herter, MD Authorized by: Corena Herter, MD   Consent:    Consent obtained:  Verbal and written   Consent given by:  Patient   Risks discussed:  Fracture, nerve damage, restricted joint movement and vascular damage   Alternatives discussed:  No treatment  and delayed treatmentInjury location: ankle Location details: right ankle Injury type: fracture Pre-procedure neurovascular assessment: neurovascularly intact Pre-procedure distal perfusion: normal Pre-procedure range of motion: reduced  Patient sedated: Yes. Refer to sedation procedure documentation for details of sedation. Manipulation performed: yes Immobilization: splint Post-procedure neurovascular assessment: post-procedure neurovascularly intact Post-procedure distal perfusion: normal Post-procedure neurological function: normal Post-procedure range of motion: unchanged     5:44 PM Called into the patient's room before they were putting on the splint.  Concerned that the patient now had a foot that looked discolored.  On my initial evaluation patient had a good PT and DP pulse.  Patient continues to have a good PT pulse, unable to get a good dopplerable PT pulse.  Patient was given IV fentanyl for pain medication.  Manipulated the right foot for some reduction with improvement of capillary refill.  Placed a splint and well repaged to discussed with orthopedics Dr. Christell Constant at [facility].  After splinting able to get a pulse on ultrasound with good arterial blood flow.  Will then lose the pulse and then regained it.  Concern for arterial spasm.  Good capillary refill of less than 2 seconds.  Sensation intact to the foot.  Palpable pulse present.  Transferred over to [facility] for further management with orthopedic surgery.  Corena Herter, MD 04/05/23 4098    Corena Herter, MD 04/05/23 1616  .   Corena Herter, MD 04/05/23 1617    Corena Herter, MD 04/05/23 Jovita Gamma    Corena Herter, MD 04/05/23 1191

## 2023-04-05 NOTE — ED Notes (Signed)
 Pt voided for fourth time since arrival, as movement is painful pt was placed in depend and purewick.  Pain appears controlled at this time at rest.  Daughter is leaving and requests call once [facility].  Daughter Frederic Jericho [phone]

## 2023-04-05 NOTE — Progress Notes (Addendum)
 Orthopedic Surgery Progress Note   Assessment: Patient is a 68 y.o. female with right segmental tibia fracture status post external fixation   Plan: -Will need definitive fixation with my traumatology colleagues at a later date -Diet: regular -DVT ppx: lovenox -Antibiotics: ancef x2 post-op doses -Weight bearing status: NWB RLE -PT evaluate and treat -Pain control -Dispo: to floor from PACU  ___________________________________________________________________________  Subjective: No acute events since surgery. Recovering in PACU. Pain adequately controlled.    Physical Exam:  General: no acute distress, appears stated age Neurologic: alert, answering questions appropriately, following commands Respiratory: unlabored breathing on nasal canula   MSK:    -Right lower extremity  External fixator in place over leg, dressings around pins are c/d/i EHL/TA/GSC intact No increased pain with passive stretch at the ankle Plantarflexes and dorsiflexes toes Sensation intact to light touch in sural, saphenous, tibial, deep peroneal, and superficial peroneal nerve distributions Foot warm and well perfused, palpable DP pulse   Patient name: Rachel [name] Patient MRN: [id number] Date: 04/05/23

## 2023-04-05 NOTE — Anesthesia Procedure Notes (Signed)
 Procedure Name: Intubation Date/Time: 04/05/2023 9:12 PM  Performed by: Camillia Herter, CRNAPre-anesthesia Checklist: Patient identified, Emergency Drugs available, Suction available and Patient being monitored Patient Re-evaluated:Patient Re-evaluated prior to induction Oxygen Delivery Method: Circle System Utilized Preoxygenation: Pre-oxygenation with 100% oxygen Induction Type: IV induction Ventilation: Mask ventilation without difficulty Laryngoscope Size: Mac and 3 Grade View: Grade I Tube type: Oral Tube size: 7.0 mm Number of attempts: 1 Airway Equipment and Method: Stylet Placement Confirmation: ETT inserted through vocal cords under direct vision, positive ETCO2 and breath sounds checked- equal and bilateral Secured at: 21 cm Tube secured with: Tape Dental Injury: Teeth and Oropharynx as per pre-operative assessment

## 2023-04-05 NOTE — ED Triage Notes (Signed)
 Pt got up from bed and slipped on hardwoods and fell, obvious deformity of right lower leg.  EMS stabilized leg with pillow and tape, NO IV access.  Unable to place on arrival.  IM pain medication given.  Pt is alert and oriented, denies any other injuries with fall.

## 2023-04-05 NOTE — ED Notes (Signed)
 [facility] called for consult per Dr Arnoldo Morale MD , spoke with Maisie Fus

## 2023-04-05 NOTE — ED Notes (Signed)
 Patient transported to CT

## 2023-04-05 NOTE — H&P (Signed)
 Orthopedic Surgery H&P Note  Assessment: Patient is a 68 y.o. female with right segemental tibia fracture   Plan: -Will plan for external fixation tonight with definitive fixation lateral with the traumatologists -Diet: NPO for procedure -DVT ppx: lovenox -Ancef and TXA on call to OR -Weight bearing status: NWB RLE -PT evaluate and treat post-operatively -Pain control -Dispo: pending completion of operative plans   Discussed recommendation for operative intervention in the form of right leg application of external fixator. Explained the risks of this procedure included, but were not limited to: pin tract infection, pin loosening, neurovascular injury, dvt/pe, stiffness, death. I told her that this would be a staged procedure so there would certainly be a need for additional surgery. With her smoking, I informed her that her risks, particularly infection for this surgery and nonunion for the definitive surgery, would be higher with her active nicotine use.The benefits of this procedure would be to improve her fracture's alignment and provide temporary stability while awaiting definitive fixation with the traumatologists. The alternatives of this surgery would be to treat the fracture with immobilization in a splint or to do no intervention. The patient's questions were answered to her satisfaction. After this discussion, patient elected to proceed with surgery. Informed consent was obtained.   ___________________________________________________________________________   Chief complaint: right leg pain  History:  Patient is a 68 y.o. female who was walking to the bathroom when she had a ground level fall earlier today. She noted immediate onset of right leg pain. She was unable to weight bear. She was brought to [facility] where orthopedics was consulted but felt that it needed transfer to [facility]. She was transferred to [facility] this evening. She is reporting right leg pain. She is not having pain  anywhere. Denies paresthesias and numbness.   Review of systems: General: denies fevers and chills, myalgias Neurologic: denies recent changes in vision, slurred speech Abdomen: denies nausea, vomiting, hematemesis Respiratory: denies cough, shortness of breath  Past medical history:  HTN Hepatitis C  Allergies: tetracyclines, penicillin   Past surgical history:  Right knee arthroscopy Hernia repair   Social history: Reports use of nicotine-containing products (cigarettes, vaping, smokeless, etc.) Alcohol use: rare Denies use of recreational drugs  Family history: -reviewed and not pertinent to segmental tibia fracture   Physical Exam:  General: no acute distress, appears stated age Neurologic: alert, answering questions appropriately, following commands Cardiovascular: regular rate, no cyanosis Respiratory: unlabored breathing on room air, symmetric chest rise Psychiatric: appropriate affect, normal cadence to speech  MSK:   -Bilateral upper extremities  No tenderness to palpation over extremity, no gross deformity, no open wounds Fires deltoid, biceps, triceps, wrist extensors, wrist flexors, finger extensors, finger flexors  AIN/PIN/IO intact  Palpable radial pulse  Sensation intact to light touch in median/ulnar/radial/axillary nerve distributions  Hand warm and well perfused  -Left lower extremity  No tenderness to palpation over extremity, no pain with log roll, no open wounds Fires hip flexors, quadriceps, hamstrings, tibialis anterior, gastrocnemius and soleus, extensor hallucis longus Plantarflexes and dorsiflexes toes Sensation intact to light touch in sural, saphenous, tibial, deep peroneal, and superficial peroneal nerve distributions Foot warm and well perfused, palpable DP pulse  -Right lower extremity  TTP at the tibia, gross deformity at the leg, no other deformity seen, no open wounds, no pain at the hip with log roll Fires hip flexors,  quadriceps, hamstrings, tibialis anterior, gastrocnemius and soleus, extensor hallucis longus No increased pain with passive stretch at the ankle Plantarflexes and dorsiflexes  toes Sensation intact to light touch in sural, saphenous, tibial, deep peroneal, and superficial peroneal nerve distributions Foot warm and well perfused, palpable DP pulse  Imaging: XRs of the right tibia from 04/05/2023 was independently reviewed and interpreted, showing a segmental tibia fracture. There is an oblique midshaft fracture that is displaced and in varus alignment. The more distal fracture is transverse with comminution in slight valgus alignment. There is also a comminuted distal fibula fracture with multiple fragments. No other fractures seen.    Patient name: Rachel [name] Patient MRN: [id number] Date: 04/05/23  Pre-operative Scores  VAS leg: 8/10

## 2023-04-05 NOTE — ED Notes (Signed)
 Attempt IV placement by EMS, I attempted and second RN.  EDP aware, first dose of medication given IM.  IV team consult placed.

## 2023-04-05 NOTE — Progress Notes (Signed)
 Patient is not able to sign consent for her operation since she is confused. We called her daughter Frederic Jericho  but the doctor has not disscussed to her about the operation and wants to disscuss first.

## 2023-04-05 NOTE — Transfer of Care (Signed)
 Immediate Anesthesia Transfer of Care Note  Patient: Rachel [name]  Procedure(s) Performed: Application of external fixator Right leg (Right)  Patient Location: PACU  Anesthesia Type:General  Level of Consciousness: awake  Airway & Oxygen Therapy: Patient Spontanous Breathing  Post-op Assessment: Report given to RN and Post -op Vital signs reviewed and stable  Post vital signs: Reviewed and stable  Last Vitals:  Vitals Value Taken Time  BP 168/89 04/05/23 2215  Temp 36.6 C 04/05/23 2210  Pulse 85 04/05/23 2230  Resp 10 04/05/23 2230  SpO2 90 % 04/05/23 2230  Vitals shown include unfiled device data.  Last Pain:  Vitals:   04/05/23 2210  PainSc: 0-No pain         Complications: No notable events documented.

## 2023-04-05 NOTE — ED Notes (Signed)
 Patient transported to X-ray

## 2023-04-06 ENCOUNTER — Encounter (HOSPITAL_COMMUNITY): Payer: Self-pay | Admitting: Orthopedic Surgery

## 2023-04-06 LAB — TYPE AND SCREEN
ABO/RH(D): A POS
Antibody Screen: NEGATIVE

## 2023-04-06 LAB — HIV ANTIBODY (ROUTINE TESTING W REFLEX): HIV Screen 4th Generation wRfx: NONREACTIVE

## 2023-04-06 LAB — ABO/RH: ABO/RH(D): A POS

## 2023-04-06 MED ORDER — NICOTINE 7 MG/24HR TD PT24
7.0000 mg | MEDICATED_PATCH | Freq: Once | TRANSDERMAL | Status: DC
  Administered 2023-04-06: 7 mg via TRANSDERMAL
  Filled 2023-04-06: qty 1

## 2023-04-06 MED ORDER — CEFAZOLIN SODIUM-DEXTROSE 2-4 GM/100ML-% IV SOLN
2.0000 g | Freq: Four times a day (QID) | INTRAVENOUS | Status: AC
Start: 2023-04-06 — End: 2023-04-06
  Administered 2023-04-06 (×2): 2 g via INTRAVENOUS
  Filled 2023-04-06 (×2): qty 100

## 2023-04-06 NOTE — Progress Notes (Signed)
 Orthopedic Surgery Progress Note   Assessment: Patient is a 68 y.o. female with right segmental tibia fracture status post external fixation   Plan: -Will need definitive fixation with my traumatology colleagues at a later date -Diet: regular -DVT ppx: lovenox -Antibiotics: ancef x2 post-op doses -Weight bearing status: NWB RLE -PT evaluate and treat -Pain control -Dispo: remain floor status  ___________________________________________________________________________  Subjective: No acute events overnight. Pain well controlled with oral medications. Denies paresthesias and numbness.    Physical Exam:  General: no acute distress, appears stated age, laying in bed Neurologic: alert, answering questions appropriately, following commands Respiratory: unlabored breathing   MSK:    -Right lower extremity  External fixator in place over leg, dressings around pins are c/d/i EHL/TA/GSC intact No increased pain with passive stretch at the ankle Plantarflexes and dorsiflexes toes Sensation intact to light touch in sural, saphenous, tibial, deep peroneal, and superficial peroneal nerve distributions Foot warm and well perfused, palpable DP pulse   Patient name: Rachel [name] Patient MRN: [id number] Date: 04/06/23

## 2023-04-06 NOTE — Plan of Care (Signed)

## 2023-04-06 NOTE — Plan of Care (Signed)
 Frequent saturation from external fixator, upper area.  Dressing changed x2 this shift.   Per pt, pain is well controlled with scheduled robaxin.  PRN oxy given x1 when dressing was changed, otherwise pt states pain is well controlled.   Pt is frequently trying to get out of bed to restroom. Bed alarm on, but pt needs to constantly be redirected and reminded of purewick and its purpose.     Problem: Pain Managment: Goal: General experience of comfort will improve and/or be controlled Outcome: Progressing   Problem: Safety: Goal: Ability to remain free from injury will improve Outcome: Progressing   Problem: Skin Integrity: Goal: Risk for impaired skin integrity will decrease Outcome: Progressing

## 2023-04-06 NOTE — Evaluation (Signed)
 Physical Therapy Evaluation Patient Details Name: Rachel [name] MRN: [id number] DOB: 17-Apr-1955 Today's Date: 04/06/2023  History of Present Illness  Patient is a 68 year old female with ground level fall, found to have right segmental tibia fracture. S/p external fixator application and closed reduction with manipulation of right tibia fracture.  Clinical Impression  Patient is confused but cooperative with PT evaluation. Confirmed with daughter on the phone that patient typically walks in the house with intermittent use of 4 wheeled walker, does not go into the community often. History of falls. She lives with her daughter with steps to enter the home. Intermittent supervision for safety and ADLs is needed at home due to cognitive concerns.  Today the patient required assistance for bed mobility. Difficulty initially with sequencing standing but able to stand with Mod A while maintaining NWB of RLE. Standing balance is poor and limited to less than 1 minute. Pain seems well controlled at this time. Recommend to continue PT to maximize independence and decrease caregiver burden.        If plan is discharge home, recommend the following: A lot of help with walking and/or transfers;A lot of help with bathing/dressing/bathroom;Help with stairs or ramp for entrance;Supervision due to cognitive status;Assistance with cooking/housework   Can travel by private vehicle   No    Equipment Recommendations  (to be determined at next level of care)  Recommendations for Other Services  OT consult    Functional Status Assessment Patient has had a recent decline in their functional status and demonstrates the ability to make significant improvements in function in a reasonable and predictable amount of time.     Precautions / Restrictions Precautions Precautions: Fall Recall of Precautions/Restrictions: Impaired Restrictions Weight Bearing Restrictions Per Provider Order: Yes RLE Weight Bearing Per  Provider Order: Non weight bearing      Mobility  Bed Mobility Overal bed mobility: Needs Assistance Bed Mobility: Sit to Supine, Supine to Sit     Supine to sit: Min assist, HOB elevated Sit to supine: Min assist, HOB elevated   General bed mobility comments: verbal cues for sequencing and technique    Transfers Overall transfer level: Needs assistance Equipment used: Rolling walker (2 wheels) Transfers: Sit to/from Stand Sit to Stand: Mod assist           General transfer comment: mild difficulty sequencing initially with cues required for RLE positioning to maintain weight bearing status    Ambulation/Gait               General Gait Details: not attempted due to limited standing tolerance and weight bearing restrictions  Stairs            Wheelchair Mobility     Tilt Bed    Modified Rankin (Stroke Patients Only)       Balance Overall balance assessment: Needs assistance, History of Falls Sitting-balance support: Feet supported Sitting balance-Leahy Scale: Good Sitting balance - Comments: patient able to weight shift while sitting without loss of balance with no UE support   Standing balance support: Bilateral upper extremity supported, Reliant on assistive device for balance Standing balance-Leahy Scale: Poor Standing balance comment: external support required for standing                             Pertinent Vitals/Pain Pain Assessment Pain Assessment: Faces Faces Pain Scale: Hurts a little bit Pain Location: right leg Pain Descriptors / Indicators: Discomfort Pain Intervention(s):  Monitored during session, Limited activity within patient's tolerance, Repositioned (elevated on pillow at end of session)    Home Living Family/patient expects to be discharged to:: Private residence Living Arrangements: Children Available Help at Discharge: Family Type of Home: House Home Access: Stairs to enter   Secretary/administrator of  Steps: 3   Home Layout: One level Home Equipment: Rollator (4 wheels) Additional Comments: information is per daughter report. soke with daughter Rachel [name] on the phone to confirm all information    Prior Function Prior Level of Function : Independent/Modified Independent;History of Falls (last six months)             Mobility Comments: history of falls. intermittenty using rollator. mostly a household distance walker ADLs Comments: Mod I with dressing, bathing. supervision for cognition and daughter reports patient has difficulty with short term memory     Extremity/Trunk Assessment   Upper Extremity Assessment Upper Extremity Assessment: Overall WFL for tasks assessed    Lower Extremity Assessment Lower Extremity Assessment: Generalized weakness;RLE deficits/detail RLE Deficits / Details: patient able to complete partial SLR. RLE Sensation: WNL       Communication   Communication Communication: No apparent difficulties    Cognition Arousal: Alert Behavior During Therapy: WFL for tasks assessed/performed   PT - Cognitive impairments: History of cognitive impairments, Orientation, Memory, Sequencing, Problem solving, Safety/Judgement   Orientation impairments: Place, Time, Situation                   PT - Cognition Comments: Patient is leasantly confused but cooperative with session. Daughter report cognitive deficits at baseline Following commands: Impaired Following commands impaired: Follows one step commands with increased time     Cueing Cueing Techniques: Verbal cues, Tactile cues, Visual cues     General Comments General comments (skin integrity, edema, etc.): education on weight bearing restrictions provided, however patient will need frequent reminders due to impaired cognition    Exercises     Assessment/Plan    PT Assessment Patient needs continued PT services  PT Problem List Decreased strength;Decreased range of motion;Decreased activity  tolerance;Decreased balance;Decreased mobility;Decreased cognition;Decreased knowledge of use of DME;Decreased safety awareness;Pain       PT Treatment Interventions DME instruction;Gait training;Stair training;Functional mobility training;Therapeutic activities;Balance training;Therapeutic exercise;Neuromuscular re-education;Cognitive remediation;Patient/family education;Wheelchair mobility training    PT Goals (Current goals can be found in the Care Plan section)  Acute Rehab PT Goals Patient Stated Goal: daughter is requesting rehab PT Goal Formulation: With family Time For Goal Achievement: 04/20/23 Potential to Achieve Goals: Fair    Frequency Min 2X/week     Co-evaluation               AM-PAC PT "6 Clicks" Mobility  Outcome Measure Help needed turning from your back to your side while in a flat bed without using bedrails?: A Little Help needed moving from lying on your back to sitting on the side of a flat bed without using bedrails?: A Lot Help needed moving to and from a bed to a chair (including a wheelchair)?: A Lot Help needed standing up from a chair using your arms (e.g., wheelchair or bedside chair)?: A Lot Help needed to walk in hospital room?: A Lot Help needed climbing 3-5 steps with a railing? : Total 6 Click Score: 12    End of Session Equipment Utilized During Treatment: Oxygen Activity Tolerance: Patient tolerated treatment well Patient left: in bed;with call bell/phone within reach;with bed alarm set Nurse Communication: Mobility status PT Visit Diagnosis:  Difficulty in walking, not elsewhere classified (R26.2);History of falling (Z91.81)    Time: 1610-9604 PT Time Calculation (min) (ACUTE ONLY): 29 min   Charges:   PT Evaluation $PT Eval Moderate Complexity: 1 Mod PT Treatments $Therapeutic Activity: 8-22 mins PT General Charges $$ ACUTE PT VISIT: 1 Visit         Donna Bernard, PT, MPT   Ina Homes 04/06/2023, 9:36 AM

## 2023-04-06 NOTE — Anesthesia Postprocedure Evaluation (Signed)
 Anesthesia Post Note  Patient: Rachel [name]  Procedure(s) Performed: Application of external fixator Right leg (Right)     Patient location during evaluation: PACU Anesthesia Type: General Level of consciousness: sedated and patient cooperative Pain management: pain level controlled Vital Signs Assessment: post-procedure vital signs reviewed and stable Respiratory status: spontaneous breathing Cardiovascular status: stable Anesthetic complications: no   No notable events documented.  Last Vitals:  Vitals:   04/05/23 2340 04/06/23 0015  BP: (!) 144/82 (!) 153/82  Pulse: 83 81  Resp: 16 17  Temp: 36.7 C 36.8 C  SpO2: 95% 98%    Last Pain:  Vitals:   04/06/23 0015  PainSc: 0-No pain                 Lewie Loron

## 2023-04-06 NOTE — Progress Notes (Signed)
 Patient arrived to PACU awake, agitated and fidgety. She is redirectable but only temporarily. She has history of dementia, bipolar and substance abuse. She does not remember why she is here but knows that she is in the hospital. She is oriented to self, birthday, she told me that her daughter had gone home for the night. She states she lives with her daughter Angelica Chessman. Patient denies any pain, has good pulse in feet and legs bilaterally and is able to move all extremities. 2340 Report called to Ramita, RN on 6N. While on the phone with RN, the patient started picking at her dressings at the upper pin sites of the external fixator and made the area bleed. Bleeding was easily stopped and dressing at pin site was changed. New compression bandage placed and secured. No further bleeding noted. Patient was then transported via hospital bed to 6N rm 22 with no complications. She tolerated transport well and denied pain. Bedside visual assessment and hand off  completed with receiving RN, Ramita.

## 2023-04-06 NOTE — NC FL2 (Signed)
 [place] MEDICAID FL2 LEVEL OF CARE FORM     IDENTIFICATION  Patient Name: Rachel [name] Birthdate: 08/04/1955 Sex: female Admission Date (Current Location): 04/05/2023  [facility] and [place] Number:  Producer, television/film/video and Address:  [facility] [place], [address] [place], [place] [address]      Provider Number: [id number]  Attending Physician Name and Address:  London Sheer, MD  Relative Name and Phone Number:       Current Level of Care: Hospital Recommended Level of Care: Skilled Nursing Facility Prior Approval Number:    Date Approved/Denied:   PASRR Number: [id number]  Discharge Plan: SNF    Current Diagnoses: Patient Active Problem List   Diagnosis Date Noted   Displaced segmental fracture of shaft of right tibia, initial encounter for closed fracture 04/05/2023   Acute cerebral infarction [facility]) 06/07/2018   CVA (cerebral vascular accident) (HCC) 06/07/2018   Vitamin B12 deficiency 05/06/2016   Cerebrovascular disease 05/04/2016   Bipolar 2 disorder, major depressive episode (HCC) 05/02/2016   Tobacco use disorder 05/02/2016   Opioid use disorder, mild, abuse (HCC) 05/02/2016   HTN (hypertension) 05/02/2016    Orientation RESPIRATION BLADDER Height & Weight     Self, Place  O2 ([place] 1L) Incontinent Weight: 141 lb 12.1 oz (64.3 kg) (bed weight) Height:  5\' 6"  (167.6 cm)  BEHAVIORAL SYMPTOMS/MOOD NEUROLOGICAL BOWEL NUTRITION STATUS      Continent Diet (See DC summary)  AMBULATORY STATUS COMMUNICATION OF NEEDS Skin   Extensive Assist Verbally Surgical wounds (R leg incision)                       Personal Care Assistance Level of Assistance  Bathing, Feeding, Dressing Bathing Assistance: Maximum assistance Feeding assistance: Limited assistance Dressing Assistance: Maximum assistance     Functional Limitations Info  Sight, Hearing, Speech Sight Info: Adequate Hearing Info: Adequate Speech Info: Adequate    SPECIAL  CARE FACTORS FREQUENCY  PT (By licensed PT), OT (By licensed OT)     PT Frequency: 5x week OT Frequency: 5x week            Contractures Contractures Info: Not present    Additional Factors Info  Code Status, Allergies Code Status Info: Full Allergies Info: Tetracycline  Penicillins  Tetracyclines & Related           Current Medications (04/06/2023):  This is the current hospital active medication list Current Facility-Administered Medications  Medication Dose Route Frequency Provider Last Rate Last Admin   acetaminophen (TYLENOL) tablet 1,000 mg  1,000 mg Oral Q8H London Sheer, MD   1,000 mg at 04/06/23 1347   enoxaparin (LOVENOX) injection 40 mg  40 mg Subcutaneous Q24H London Sheer, MD       hydrochlorothiazide (HYDRODIURIL) tablet 25 mg  25 mg Oral Daily London Sheer, MD   25 mg at 04/06/23 1101   methocarbamol (ROBAXIN) tablet 500 mg  500 mg Oral QID London Sheer, MD   500 mg at 04/06/23 1347   oxyCODONE (Oxy IR/ROXICODONE) immediate release tablet 5-10 mg  5-10 mg Oral Q4H PRN London Sheer, MD   5 mg at 04/06/23 1124   polyethylene glycol (MIRALAX / GLYCOLAX) packet 17 g  17 g Oral Daily London Sheer, MD   17 g at 04/06/23 1102   senna (SENOKOT) tablet 8.6 mg  1 tablet Oral BID London Sheer, MD   8.6 mg at 04/06/23  1101   tranexamic acid (CYKLOKAPRON) IVPB 1,000 mg  1,000 mg Intravenous To OR London Sheer, MD         Discharge Medications: Please see discharge summary for a list of discharge medications.  Relevant Imaging Results:  Relevant Lab Results:   Additional Information SS# [id number] [place]

## 2023-04-06 NOTE — TOC Initial Note (Signed)
 Transition of Care [facility]) - Initial/Assessment Note    Patient Details  Name: Rachel [name] MRN: [id number] Date of Birth: 05-27-1955  Transition of Care [facility]) CM/SW Contact:    Carley Hammed, LCSW Phone Number: 04/06/2023, 1:54 PM  Clinical Narrative:                  CSW noted pt is oriented x2 and called dtr, Mandie to discuss SNF recommendation. Mandie is in agreement with a preference to be near [place]. Pt is from home with dtr usually independent. CSW advised about process and insurance. Dtr agreeable to PTAR at DC. CSW to complete workup and fax out referrals. TOC will continue to follow.   Expected Discharge Plan: Skilled Nursing Facility Barriers to Discharge: Continued Medical Work up, English as a second language teacher, SNF Pending bed offer   Patient Goals and CMS Choice Patient states their goals for this hospitalization and ongoing recovery are:: Pt disoriented and unable to participate in goal setting. CMS Medicare.gov Compare Post Acute Care list provided to:: Patient Represenative (must comment) Choice offered to / list presented to : Adult Children      Expected Discharge Plan and Services In-house Referral: Clinical Social Work   Post Acute Care Choice: Skilled Nursing Facility Living arrangements for the past 2 months: Single Family Home                                      Prior Living Arrangements/Services Living arrangements for the past 2 months: Single Family Home Lives with:: Adult Children Patient language and need for interpreter reviewed:: Yes Do you feel safe going back to the place where you live?: Yes      Need for Family Participation in Patient Care: Yes (Comment) Care giver support system in place?: Yes (comment)   Criminal Activity/Legal Involvement Pertinent to Current Situation/Hospitalization: No - Comment as needed  Activities of Daily Living   ADL Screening (condition at time of admission) Independently performs ADLs?: Yes  (appropriate for developmental age) Is the patient deaf or have difficulty hearing?: No Does the patient have difficulty seeing, even when wearing glasses/contacts?: No Does the patient have difficulty concentrating, remembering, or making decisions?: Yes  Permission Sought/Granted Permission sought to share information with : Family Supports Permission granted to share information with : Yes, Verbal Permission Granted  Share Information with NAME: Mandie     Permission granted to share info w Relationship: Dtr     Emotional Assessment Appearance:: Appears stated age Attitude/Demeanor/Rapport: Unable to Assess Affect (typically observed): Unable to Assess Orientation: : Oriented to Self, Oriented to Place Alcohol / Substance Use: Not Applicable Psych Involvement: No (comment)  Admission diagnosis:  Displaced segmental fracture of shaft of right tibia, initial encounter for closed fracture [S82.261A] Patient Active Problem List   Diagnosis Date Noted   Displaced segmental fracture of shaft of right tibia, initial encounter for closed fracture 04/05/2023   Acute cerebral infarction (HCC) 06/07/2018   CVA (cerebral vascular accident) (HCC) 06/07/2018   Vitamin B12 deficiency 05/06/2016   Cerebrovascular disease 05/04/2016   Bipolar 2 disorder, major depressive episode (HCC) 05/02/2016   Tobacco use disorder 05/02/2016   Opioid use disorder, mild, abuse (HCC) 05/02/2016   HTN (hypertension) 05/02/2016   PCP:  Patient, No Pcp Per Pharmacy:   [facility] [address] [place] [address] Phone: [phone] Fax: [phone]  [facility] [address] [place] [address] Phone: [phone] Fax: [phone]     Social Drivers of Health (SDOH) Social History: SDOH Screenings   Food Insecurity: Patient Declined (04/06/2023)  Housing: Unknown (04/06/2023)  Transportation Needs: No  Transportation Needs (04/06/2023)  Utilities: Not At Risk (04/06/2023)  Alcohol Screen: Low Risk  (12/12/2016)  Social Connections: Unknown (04/06/2023)  Tobacco Use: High Risk (04/06/2023)   SDOH Interventions:     Readmission Risk Interventions     No data to display

## 2023-04-07 ENCOUNTER — Other Ambulatory Visit: Payer: Self-pay

## 2023-04-07 ENCOUNTER — Encounter (HOSPITAL_COMMUNITY): Payer: Self-pay | Admitting: Orthopedic Surgery

## 2023-04-07 ENCOUNTER — Inpatient Hospital Stay (HOSPITAL_COMMUNITY)

## 2023-04-07 ENCOUNTER — Inpatient Hospital Stay (HOSPITAL_COMMUNITY): Admitting: Anesthesiology

## 2023-04-07 ENCOUNTER — Encounter (HOSPITAL_COMMUNITY): Admission: EM | Disposition: A | Discharge status: Skilled Nursing Facility | Payer: Self-pay | Attending: Student

## 2023-04-07 DIAGNOSIS — Z472 Encounter for removal of internal fixation device: Secondary | ICD-10-CM

## 2023-04-07 DIAGNOSIS — S82261A Displaced segmental fracture of shaft of right tibia, initial encounter for closed fracture: Secondary | ICD-10-CM | POA: Diagnosis not present

## 2023-04-07 DIAGNOSIS — I10 Essential (primary) hypertension: Secondary | ICD-10-CM

## 2023-04-07 DIAGNOSIS — F17210 Nicotine dependence, cigarettes, uncomplicated: Secondary | ICD-10-CM | POA: Diagnosis not present

## 2023-04-07 HISTORY — PX: EXTERNAL FIXATION REMOVAL: SHX5040

## 2023-04-07 HISTORY — PX: TIBIA IM NAIL INSERTION: SHX2516

## 2023-04-07 LAB — VITAMIN D 25 HYDROXY (VIT D DEFICIENCY, FRACTURES): Vit D, 25-Hydroxy: 7.61 ng/mL — ABNORMAL LOW (ref 30–100)

## 2023-04-07 SURGERY — INSERTION, INTRAMEDULLARY ROD, TIBIA
Anesthesia: General | Site: Leg Lower | Laterality: Right

## 2023-04-07 MED ORDER — 0.9 % SODIUM CHLORIDE (POUR BTL) OPTIME
TOPICAL | Status: DC | PRN
Start: 2023-04-07 — End: 2023-04-07
  Administered 2023-04-07: 1000 mL

## 2023-04-07 MED ORDER — SUGAMMADEX SODIUM 200 MG/2ML IV SOLN
INTRAVENOUS | Status: DC | PRN
  Administered 2023-04-07: 200 mg via INTRAVENOUS

## 2023-04-07 MED ORDER — ORAL CARE MOUTH RINSE: 15.0000 mL | Freq: Once | OROMUCOSAL | Status: AC

## 2023-04-07 MED ORDER — CEFAZOLIN SODIUM-DEXTROSE 2-4 GM/100ML-% IV SOLN
2.0000 g | INTRAVENOUS | Status: AC
  Administered 2023-04-07: 2 g via INTRAVENOUS
  Filled 2023-04-07: qty 100

## 2023-04-07 MED ORDER — LORAZEPAM 2 MG/ML IJ SOLN: 1.0000 mg | Freq: Once | INTRAMUSCULAR | Status: DC

## 2023-04-07 MED ORDER — CHLORHEXIDINE GLUCONATE 0.12 % MT SOLN
15.0000 mL | Freq: Once | OROMUCOSAL | Status: AC
  Administered 2023-04-07: 15 mL via OROMUCOSAL
  Filled 2023-04-07: qty 15

## 2023-04-07 MED ORDER — FENTANYL CITRATE (PF) 250 MCG/5ML IJ SOLN
INTRAMUSCULAR | Status: DC | PRN
  Administered 2023-04-07: 50 ug via INTRAVENOUS
  Administered 2023-04-07: 75 ug via INTRAVENOUS
  Administered 2023-04-07: 25 ug via INTRAVENOUS
  Administered 2023-04-07 (×2): 50 ug via INTRAVENOUS

## 2023-04-07 MED ORDER — POLYETHYLENE GLYCOL 3350 17 G PO PACK: 17.0000 g | PACK | Freq: Every day | ORAL | Status: DC | PRN

## 2023-04-07 MED ORDER — ENOXAPARIN SODIUM 40 MG/0.4ML IJ SOSY
40.0000 mg | PREFILLED_SYRINGE | INTRAMUSCULAR | Status: DC
Start: 2023-04-08 — End: 2023-04-09
  Administered 2023-04-08 – 2023-04-09 (×2): 40 mg via SUBCUTANEOUS
  Filled 2023-04-07 (×2): qty 0.4

## 2023-04-07 MED ORDER — DEXMEDETOMIDINE HCL IN NACL 80 MCG/20ML IV SOLN
INTRAVENOUS | Status: DC | PRN
  Administered 2023-04-07: 8 ug via INTRAVENOUS

## 2023-04-07 MED ORDER — PROPOFOL 10 MG/ML IV BOLUS
INTRAVENOUS | Status: DC | PRN
  Administered 2023-04-07: 90 mg via INTRAVENOUS

## 2023-04-07 MED ORDER — BACITRACIN ZINC 500 UNIT/GM EX OINT
TOPICAL_OINTMENT | CUTANEOUS | Status: AC
  Filled 2023-04-07: qty 28.35

## 2023-04-07 MED ORDER — DOCUSATE SODIUM 100 MG PO CAPS
100.0000 mg | ORAL_CAPSULE | Freq: Two times a day (BID) | ORAL | Status: DC
  Administered 2023-04-08 – 2023-04-09 (×2): 100 mg via ORAL
  Filled 2023-04-07 (×3): qty 1

## 2023-04-07 MED ORDER — DEXAMETHASONE SODIUM PHOSPHATE 10 MG/ML IJ SOLN
INTRAMUSCULAR | Status: DC | PRN
  Administered 2023-04-07: 4 mg via INTRAVENOUS

## 2023-04-07 MED ORDER — VANCOMYCIN HCL 1000 MG IV SOLR
INTRAVENOUS | Status: AC
Start: 2023-04-07 — End: ?
  Filled 2023-04-07: qty 20

## 2023-04-07 MED ORDER — NICOTINE 7 MG/24HR TD PT24
7.0000 mg | MEDICATED_PATCH | Freq: Every day | TRANSDERMAL | Status: DC
  Administered 2023-04-07 – 2023-04-09 (×3): 7 mg via TRANSDERMAL
  Filled 2023-04-07 (×3): qty 1

## 2023-04-07 MED ORDER — TRANEXAMIC ACID-NACL 1000-0.7 MG/100ML-% IV SOLN
1000.0000 mg | INTRAVENOUS | Status: AC
  Administered 2023-04-07: 1000 mg via INTRAVENOUS
  Filled 2023-04-07: qty 100

## 2023-04-07 MED ORDER — HALOPERIDOL LACTATE 5 MG/ML IJ SOLN
0.5000 mg | Freq: Four times a day (QID) | INTRAMUSCULAR | Status: DC | PRN
  Administered 2023-04-07: 1 mg via INTRAVENOUS
  Filled 2023-04-07: qty 1

## 2023-04-07 MED ORDER — ROCURONIUM BROMIDE 10 MG/ML (PF) SYRINGE
PREFILLED_SYRINGE | INTRAVENOUS | Status: DC | PRN
  Administered 2023-04-07: 50 mg via INTRAVENOUS

## 2023-04-07 MED ORDER — FENTANYL CITRATE (PF) 100 MCG/2ML IJ SOLN
25.0000 ug | INTRAMUSCULAR | Status: DC | PRN
  Administered 2023-04-07: 50 ug via INTRAVENOUS

## 2023-04-07 MED ORDER — PROPOFOL 10 MG/ML IV BOLUS
INTRAVENOUS | Status: AC
  Filled 2023-04-07: qty 20

## 2023-04-07 MED ORDER — ACETAMINOPHEN 10 MG/ML IV SOLN: 1000.0000 mg | Freq: Once | INTRAVENOUS | Status: DC | PRN

## 2023-04-07 MED ORDER — VANCOMYCIN HCL 1000 MG IV SOLR
INTRAVENOUS | Status: DC | PRN
  Administered 2023-04-07: 1000 mg

## 2023-04-07 MED ORDER — FENTANYL CITRATE (PF) 250 MCG/5ML IJ SOLN
INTRAMUSCULAR | Status: AC
  Filled 2023-04-07: qty 5

## 2023-04-07 MED ORDER — OXYCODONE HCL 5 MG PO TABS: 5.0000 mg | ORAL_TABLET | Freq: Once | ORAL | Status: DC | PRN

## 2023-04-07 MED ORDER — LIDOCAINE 2% (20 MG/ML) 5 ML SYRINGE
INTRAMUSCULAR | Status: DC | PRN
  Administered 2023-04-07: 100 mg via INTRAVENOUS

## 2023-04-07 MED ORDER — OXYCODONE HCL 5 MG/5ML PO SOLN: 5.0000 mg | Freq: Once | ORAL | Status: DC | PRN

## 2023-04-07 MED ORDER — SODIUM CHLORIDE 0.9% FLUSH: 3.0000 mL | INTRAVENOUS | Status: DC | PRN

## 2023-04-07 MED ORDER — ONDANSETRON HCL 4 MG PO TABS: 4.0000 mg | ORAL_TABLET | Freq: Four times a day (QID) | ORAL | Status: DC | PRN

## 2023-04-07 MED ORDER — LORAZEPAM 1 MG PO TABS
1.0000 mg | ORAL_TABLET | Freq: Two times a day (BID) | ORAL | Status: DC | PRN
  Administered 2023-04-07: 1 mg via ORAL
  Filled 2023-04-07: qty 1

## 2023-04-07 MED ORDER — DIPHENHYDRAMINE HCL 12.5 MG/5ML PO ELIX
12.5000 mg | ORAL_SOLUTION | ORAL | Status: DC | PRN
  Filled 2023-04-07: qty 10

## 2023-04-07 MED ORDER — HALOPERIDOL LACTATE 5 MG/ML IJ SOLN: 0.5000 mg | Freq: Four times a day (QID) | INTRAMUSCULAR | Status: DC | PRN

## 2023-04-07 MED ORDER — LACTATED RINGERS IV SOLN: INTRAVENOUS | Status: DC

## 2023-04-07 MED ORDER — LORAZEPAM 1 MG PO TABS
1.0000 mg | ORAL_TABLET | Freq: Once | ORAL | Status: AC
  Administered 2023-04-07: 1 mg via ORAL
  Filled 2023-04-07: qty 1

## 2023-04-07 MED ORDER — METOCLOPRAMIDE HCL 5 MG/ML IJ SOLN: 5.0000 mg | Freq: Three times a day (TID) | INTRAMUSCULAR | Status: DC | PRN

## 2023-04-07 MED ORDER — HALOPERIDOL 0.5 MG PO TABS: 0.5000 mg | ORAL_TABLET | Freq: Three times a day (TID) | ORAL | Status: DC | PRN

## 2023-04-07 MED ORDER — CEFAZOLIN SODIUM-DEXTROSE 2-4 GM/100ML-% IV SOLN
2.0000 g | Freq: Three times a day (TID) | INTRAVENOUS | Status: AC
  Filled 2023-04-07 (×2): qty 100

## 2023-04-07 MED ORDER — FENTANYL CITRATE (PF) 100 MCG/2ML IJ SOLN
INTRAMUSCULAR | Status: AC
  Administered 2023-04-07: 50 ug via INTRAVENOUS
  Filled 2023-04-07: qty 2

## 2023-04-07 MED ORDER — SODIUM CHLORIDE 0.9% FLUSH
3.0000 mL | Freq: Two times a day (BID) | INTRAVENOUS | Status: DC
  Administered 2023-04-09: 10 mL via INTRAVENOUS

## 2023-04-07 MED ORDER — HALOPERIDOL LACTATE 5 MG/ML IJ SOLN: 1.0000 mg | Freq: Four times a day (QID) | INTRAMUSCULAR | Status: DC | PRN

## 2023-04-07 MED ORDER — ONDANSETRON HCL 4 MG/2ML IJ SOLN
INTRAMUSCULAR | Status: DC | PRN
  Administered 2023-04-07: 4 mg via INTRAVENOUS

## 2023-04-07 MED ORDER — ONDANSETRON HCL 4 MG/2ML IJ SOLN: 4.0000 mg | Freq: Once | INTRAMUSCULAR | Status: DC | PRN

## 2023-04-07 MED ORDER — ONDANSETRON HCL 4 MG/2ML IJ SOLN: 4.0000 mg | Freq: Four times a day (QID) | INTRAMUSCULAR | Status: DC | PRN

## 2023-04-07 MED ORDER — METOCLOPRAMIDE HCL 5 MG PO TABS: 5.0000 mg | ORAL_TABLET | Freq: Three times a day (TID) | ORAL | Status: DC | PRN

## 2023-04-07 SURGICAL SUPPLY — 73 items
BAG COUNTER SPONGE SURGICOUNT (BAG) ×1 IMPLANT
BIT DRILL CROWE POINT TWST 4.3 (DRILL) IMPLANT
BIT DRILL SHORT 2.0 ZI (BIT) IMPLANT
BIT DRILL SHORT 2.5 (BIT) IMPLANT
BIT DRL SHORT 2.5 (BIT) ×1 IMPLANT
BLADE SURG 10 STRL SS (BLADE) ×2 IMPLANT
BNDG COHESIVE 4X5 TAN STRL LF (GAUZE/BANDAGES/DRESSINGS) ×1 IMPLANT
BNDG ELASTIC 4INX 5YD STR LF (GAUZE/BANDAGES/DRESSINGS) IMPLANT
BNDG ELASTIC 4X5.8 VLCR STR LF (GAUZE/BANDAGES/DRESSINGS) ×1 IMPLANT
BNDG ELASTIC 6INX 5YD STR LF (GAUZE/BANDAGES/DRESSINGS) ×1 IMPLANT
BNDG GAUZE DERMACEA FLUFF 4 (GAUZE/BANDAGES/DRESSINGS) ×2 IMPLANT
BRUSH SCRUB EZ PLAIN DRY (MISCELLANEOUS) ×2 IMPLANT
CHLORAPREP W/TINT 26 (MISCELLANEOUS) ×1 IMPLANT
COVER SURGICAL LIGHT HANDLE (MISCELLANEOUS) ×2 IMPLANT
DRAPE C-ARM 42X72 X-RAY (DRAPES) ×1 IMPLANT
DRAPE C-ARMOR (DRAPES) ×1 IMPLANT
DRAPE HALF SHEET 40X57 (DRAPES) ×2 IMPLANT
DRAPE IMP U-DRAPE 54X76 (DRAPES) ×2 IMPLANT
DRAPE INCISE IOBAN 66X45 STRL (DRAPES) IMPLANT
DRAPE SURG ORHT 6 SPLT 77X108 (DRAPES) ×2 IMPLANT
DRAPE U-SHAPE 47X51 STRL (DRAPES) ×1 IMPLANT
DRESSING MEPILEX FLEX 4X4 (GAUZE/BANDAGES/DRESSINGS) IMPLANT
DRILL CROWE POINT TWIST 4.3 (DRILL) ×2 IMPLANT
DRILL DISTAL 4.3 INTERLOCK (DRILL) IMPLANT
DRILL SHORT 4.3 INTERLOCK (DRILL) IMPLANT
DRIVER RETENTION T15 LONG (ORTHOPEDIC DISPOSABLE SUPPLIES) IMPLANT
DRSG ADAPTIC 3X8 NADH LF (GAUZE/BANDAGES/DRESSINGS) ×1 IMPLANT
DRSG MEPILEX FLEX 4X4 (GAUZE/BANDAGES/DRESSINGS) ×2 IMPLANT
DRSG MEPITEL 4X7.2 (GAUZE/BANDAGES/DRESSINGS) IMPLANT
ELECT REM PT RETURN 9FT ADLT (ELECTROSURGICAL) ×1 IMPLANT
ELECTRODE REM PT RTRN 9FT ADLT (ELECTROSURGICAL) ×1 IMPLANT
GAUZE PAD ABD 8X10 STRL (GAUZE/BANDAGES/DRESSINGS) IMPLANT
GAUZE SPONGE 4X4 12PLY STRL (GAUZE/BANDAGES/DRESSINGS) ×1 IMPLANT
GLOVE BIO SURGEON STRL SZ 6.5 (GLOVE) ×3 IMPLANT
GLOVE BIO SURGEON STRL SZ7.5 (GLOVE) ×4 IMPLANT
GLOVE BIOGEL PI IND STRL 6.5 (GLOVE) ×1 IMPLANT
GLOVE BIOGEL PI IND STRL 7.5 (GLOVE) ×1 IMPLANT
GOWN STRL REUS W/ TWL LRG LVL3 (GOWN DISPOSABLE) ×2 IMPLANT
GUIDEPIN 3.2X17.5 THRD DISP (PIN) IMPLANT
GUIDEWIRE BEAD TIP 100 ST (WIRE) IMPLANT
KIT BASIN OR (CUSTOM PROCEDURE TRAY) ×1 IMPLANT
KIT TURNOVER KIT B (KITS) ×1 IMPLANT
MANIFOLD NEPTUNE II (INSTRUMENTS) ×1 IMPLANT
NAIL TIB AFFIX 10X340 ST (Nail) IMPLANT
NS IRRIG 1000ML POUR BTL (IV SOLUTION) ×1 IMPLANT
PACK TOTAL JOINT (CUSTOM PROCEDURE TRAY) ×1 IMPLANT
PAD ARMBOARD 7.5X6 YLW CONV (MISCELLANEOUS) ×2 IMPLANT
PAD CAST 4YDX4 CTTN HI CHSV (CAST SUPPLIES) IMPLANT
PADDING CAST COTTON 6X4 STRL (CAST SUPPLIES) ×3 IMPLANT
PLATE ANAT LAT FIB 12H RT (Plate) IMPLANT
SCREW CORT AFFIX ST 5X40 (Screw) IMPLANT
SCREW CORT AFFIX ST 5X52 (Screw) IMPLANT
SCREW CORT ST AFFIX 5X46 (Screw) IMPLANT
SCREW CORTICAL BONE 5X30 ST (Screw) IMPLANT
SCREW CORTICAL BONE 5X48 ST (Screw) IMPLANT
SCREW LOCK MDS 2.7X14 (Screw) IMPLANT
SCREW LOCK MDS 2.7X16 (Screw) IMPLANT
SCREW LOCK MDS 2.7X18 (Screw) IMPLANT
SCREW NLOCK ALPS 3.5X14 (Screw) IMPLANT
SCREW NON-LOCK 3.5X12 (Screw) IMPLANT
SPONGE T-LAP 18X18 ~~LOC~~+RFID (SPONGE) ×1 IMPLANT
STAPLER VISISTAT 35W (STAPLE) ×1 IMPLANT
SUT ETHILON 3 0 PS 1 (SUTURE) IMPLANT
SUT MNCRL AB 3-0 PS2 18 (SUTURE) ×1 IMPLANT
SUT MON AB 2-0 CT1 36 (SUTURE) ×1 IMPLANT
SUT VIC AB 0 CT1 27XBRD ANBCTR (SUTURE) IMPLANT
SUT VIC AB 0 CT1 36 (SUTURE) IMPLANT
SUT VIC AB 2-0 CT1 TAPERPNT 27 (SUTURE) IMPLANT
TOWEL GREEN STERILE (TOWEL DISPOSABLE) ×2 IMPLANT
TOWEL GREEN STERILE FF (TOWEL DISPOSABLE) ×2 IMPLANT
UNDERPAD 30X36 HEAVY ABSORB (UNDERPADS AND DIAPERS) ×1 IMPLANT
WATER STERILE IRR 1000ML POUR (IV SOLUTION) ×2 IMPLANT
YANKAUER SUCT BULB TIP NO VENT (SUCTIONS) IMPLANT

## 2023-04-07 NOTE — Interval H&P Note (Signed)
 History and Physical Interval Note:  04/07/2023 10:22 AM  Rachel [name]  has presented today for surgery, with the diagnosis of Right segmental tibia fracture.  The various methods of treatment have been discussed with the patient and family. After consideration of risks, benefits and other options for treatment, the patient has consented to  Procedure(s): INSERTION, INTRAMEDULLARY ROD, TIBIA (Right) REMOVAL, EXTERNAL FIXATION DEVICE, LOWER EXTREMITY (Right) as a surgical intervention.  The patient's history has been reviewed, patient examined, no change in status, stable for surgery.  I have reviewed the patient's chart and labs.  Questions were answered to the patient's satisfaction.     Caryn Bee P Shadeed Colberg

## 2023-04-07 NOTE — H&P (View-Only) (Signed)
 Orthopaedic Trauma Service (OTS) Consult   Patient ID: Rachel [name] MRN: [id number] DOB/AGE: 1955-07-31 68 y.o.  Reason for Consult:Right distal tibia/fibula fracture Referring Physician: Dr. Delmer Islam, MD Rachel [name]  HPI: Rachel [name] is an 68 y.o. female who is being seen in consultation at the request of Dr. Christell Constant for evaluation of right lower extremity fracture.  Patient has a segmental tibial shaft fracture with a fibula fracture.  Due to the complexity of this injury Dr. Christell Constant felt that this is outside the scope of practice and required treatment by an orthopedic traumatologist.  Patient was seen and evaluated in preoperative holding area.  Patient is confused and has baseline confusion.  She lives at home with her daughter.  I did discuss with her daughter over the phone.  Patient was apparently ambulating on her external fixator last night.  Patient does not really appropriately follow history with me today.  Past Medical History:  Diagnosis Date   Chronic confusion    well documented in the medical record   Hepatitis C    Hypertension     Past Surgical History:  Procedure Laterality Date   EXTERNAL FIXATION LEG Right 04/05/2023   Procedure: Application of external fixator Right leg;  Surgeon: London Sheer, MD;  Location: [facility] OR;  Service: Orthopedics;  Laterality: Right;   KNEE ARTHROSCOPY      History reviewed. No pertinent family history.  Social History:  reports that she has been smoking cigarettes. She has never used smokeless tobacco. She reports current alcohol use. She reports current drug use. Frequency: 1.00 time per week. Drug: Marijuana.  Allergies:  Allergies  Allergen Reactions   Tetracycline Hives   Penicillins Rash   Tetracyclines & Related Rash    Medications:  No current facility-administered medications on file prior to encounter.   No current outpatient medications on file prior to encounter.     ROS: Constitutional: No fever or  chills Vision: No changes in vision ENT: No difficulty swallowing CV: No chest pain Pulm: No SOB or wheezing GI: No nausea or vomiting GU: No urgency or inability to hold urine Skin: No poor wound healing Neurologic: No numbness or tingling Psychiatric: No depression or anxiety Heme: No bruising Allergic: No reaction to medications or food   Exam: Blood pressure (!) 152/115, pulse 89, temperature 99.1 F (37.3 C), resp. rate 18, height 5\' 6"  (1.676 m), weight 64.3 kg, SpO2 96%. General: No acute distress Orientation: Awake and alert Mood and Affect: Slightly agitated Gait: Unable to assess due to her fractures Coordination and balance: Within normal limits  Right lower extremity: Ex-Fix in place that is clean dry and intact.  Compartments are soft compressible.  She is will wiggle her toes and she endorses sensation to the dorsum and plantar aspect of her foot.  She is warm well-perfused foot with 2+ DP pulses.  Left lower extremity: Skin without lesions. No tenderness to palpation. Full painless ROM, full strength in each muscle groups without evidence of instability.   Medical Decision Making: Data: Imaging: X-rays and CT scan are reviewed which shows a segmental distal tibia fracture with extra-articular involvement of the distal tibia and a comminuted distal fibula fracture.  Labs: No results found for this or any previous visit (from the past 24 hours).   Imaging or Labs ordered: None  Medical history and chart was reviewed and case discussed with medical provider.  Assessment/Plan: 68 year old female with right distal tibia and fibula fracture status post external fixation.  We will plan to proceed with surgical fixation of her right leg likely with intramedullary nailing and open reduction internal fixation of the fibula.  Risk and benefits were discussed with the patient's daughter as well as the patient.  Risks include but not limited to bleeding, infection,  malunion, nonunion, hardware failure, hardware irritation, nerve and blood vessel injury, DVT, even the possibility anesthetic complications.  They agreed to proceed with surgery and consent was obtained.  Roby Lofts, MD [facility] [phone] (office) [email or web address]

## 2023-04-07 NOTE — Anesthesia Preprocedure Evaluation (Addendum)
 Anesthesia Evaluation  Patient identified by MRN, date of birth, ID band Patient confused    Reviewed: Allergy & Precautions, NPO status , Patient's Chart, lab work & pertinent test results, reviewed documented beta blocker date and time   History of Anesthesia Complications Negative for: history of anesthetic complications  Airway Mallampati: II  TM Distance: >3 FB     Dental  (+) Upper Dentures, Poor Dentition, Dental Advisory Given   Pulmonary COPD, Current Smoker (abstained from smoking DOS against her will) and Patient abstained from smoking.   breath sounds clear to auscultation       Cardiovascular hypertension, (-) angina (-) Past MI, (-) Cardiac Stents and (-) CABG (-) dysrhythmias (-) pacemaker(-) Valvular Problems/Murmurs Rhythm:Regular Rate:Normal  Normal TTE 3 years ago   Neuro/Psych neg Seizures PSYCHIATRIC DISORDERS   Bipolar Disorder   CVA    GI/Hepatic ,,,(+) Hepatitis -, C  Endo/Other    Renal/GU      Musculoskeletal   Abdominal   Peds  Hematology   Anesthesia Other Findings   Reproductive/Obstetrics                              Anesthesia Physical Anesthesia Plan  ASA: 3  Anesthesia Plan: General   Post-op Pain Management:    Induction: Intravenous  PONV Risk Score and Plan: 2 and Ondansetron and Dexamethasone  Airway Management Planned: Oral ETT  Additional Equipment:   Intra-op Plan:   Post-operative Plan: Extubation in OR  Informed Consent: I have reviewed the patients History and Physical, chart, labs and discussed the procedure including the risks, benefits and alternatives for the proposed anesthesia with the patient or authorized representative who has indicated his/her understanding and acceptance.     Dental advisory given and Consent reviewed with POA  Plan Discussed with: CRNA  Anesthesia Plan Comments:          Anesthesia Quick  Evaluation

## 2023-04-07 NOTE — Transfer of Care (Signed)
 Immediate Anesthesia Transfer of Care Note  Patient: Rachel [name]  Procedure(s) Performed: INSERTION, INTRAMEDULLARY ROD, TIBIA (Right: Leg Lower) REMOVAL, EXTERNAL FIXATION DEVICE, LOWER EXTREMITY (Right: Leg Lower)  Patient Location: PACU  Anesthesia Type:General  Level of Consciousness: awake, alert , and oriented  Airway & Oxygen Therapy: Patient Spontanous Breathing and Patient connected to nasal cannula oxygen  Post-op Assessment: Report given to RN and Post -op Vital signs reviewed and stable  Post vital signs: Reviewed and stable  Last Vitals:  Vitals Value Taken Time  BP 129/73 04/07/23 1305  Temp    Pulse 66 04/07/23 1312  Resp 10 04/07/23 1312  SpO2 97 % 04/07/23 1312  Vitals shown include unfiled device data.  Last Pain:  Vitals:   04/07/23 0821  TempSrc: Oral  PainSc:          Complications: No notable events documented.

## 2023-04-07 NOTE — Anesthesia Procedure Notes (Signed)
 Procedure Name: Intubation Date/Time: 04/07/2023 11:24 AM  Performed by: Sandie Ano, CRNAPre-anesthesia Checklist: Patient identified, Emergency Drugs available, Suction available and Patient being monitored Patient Re-evaluated:Patient Re-evaluated prior to induction Oxygen Delivery Method: Circle System Utilized Preoxygenation: Pre-oxygenation with 100% oxygen Induction Type: IV induction Ventilation: Mask ventilation without difficulty Laryngoscope Size: Mac and 3 Grade View: Grade I Tube type: Oral Tube size: 7.5 mm Number of attempts: 1 Airway Equipment and Method: Stylet and Oral airway Placement Confirmation: ETT inserted through vocal cords under direct vision, positive ETCO2 and breath sounds checked- equal and bilateral Secured at: 23 cm Tube secured with: Tape Dental Injury: Teeth and Oropharynx as per pre-operative assessment

## 2023-04-07 NOTE — TOC Progression Note (Signed)
 Transition of Care [facility]) - Progression Note    Patient Details  Name: Rachel [name] MRN: [id number] Date of Birth: 01/09/1956  Transition of Care [facility]) CM/SW Contact  Carley Hammed, LCSW Phone Number: 04/07/2023, 3:44 PM  Clinical Narrative:     CSW spoke with pt's daughter and provided bed offers and Medicare.gov ratings. Dtr agreeable to [facility] in [place]. CSW answered all questions. Pt had procedure today, will start auth when closer to DC date and after PT works with again. TOC will continue to follow.   Expected Discharge Plan: Skilled Nursing Facility Barriers to Discharge: Continued Medical Work up, English as a second language teacher, SNF Pending bed offer  Expected Discharge Plan and Services In-house Referral: Clinical Social Work   Post Acute Care Choice: Skilled Nursing Facility Living arrangements for the past 2 months: Single Family Home                                       Social Determinants of Health (SDOH) Interventions SDOH Screenings   Food Insecurity: Patient Declined (04/06/2023)  Housing: Unknown (04/06/2023)  Transportation Needs: No Transportation Needs (04/06/2023)  Utilities: Not At Risk (04/06/2023)  Alcohol Screen: Low Risk  (12/12/2016)  Social Connections: Unknown (04/06/2023)  Tobacco Use: High Risk (04/07/2023)    Readmission Risk Interventions     No data to display

## 2023-04-07 NOTE — Discharge Instructions (Addendum)
 Rachel Merle, MD Thyra Breed PA-C [facility] [address] [phone] Jani Files)   [phone] (fax)                                  POST-OPERATIVE INSTRUCTIONS     WEIGHT BEARING STATUS: non-weightbearing right lower extremity  RANGE OF MOTION/ACTIVITY:  ok for knee motion. Do not remove splint  WOUND CARE Please keep splint clean dry and intact until follow-up. If your splint gets wet for any reason please contact the office immediately.  Do not stick anything down your splint such as pencils, momey, hangers to try and scratch yourself.  If you feel itchy take Benadryl as prescribed on the bottle for itching You may shower on Post-Op Day #2.  You must keep splint dry during this process and may find that a plastic bag taped around the extremity or alternatively a towel based bath may be a better option.   If you get your splint wet or if it is damaged please contact our clinic.  EXERCISES Due to your splint being in place you will not be able to bear weight through your extremity.   DO NOT PUT ANY WEIGHT ON YOUR OPERATIVE LEG Please use crutches or a walker to avoid weight bearing.   DVT/PE prophylaxis: Aspirin  DIET: As you were eating previously.  Can use over the counter stool softeners and bowel preparations, such as Miralax, to help with bowel movements.  Narcotics can be constipating.  Be sure to drink plenty of fluids   POST-OP MEDICATIONS- Multimodal approach to pain control  In general your pain will be controlled with a combination of substances.  Prescriptions unless otherwise discussed are electronically sent to your pharmacy.  This is a carefully made plan we use to minimize narcotic use.     - Acetaminophen - Non-narcotic pain medicine taken on a scheduled basis   - Oxycodone - This is a strong narcotic, to be used only on an "as needed" basis for pain.  -  Aspirin 325 mg - This medicine is used to minimize the risk of blood clots  after surgery.             -          Zofran - take as needed for nausea   FOLLOW-UP If you develop a Fever (>101.5), Redness or Drainage from the surgical incision site, please call our office to arrange for an evaluation. Please call the office to schedule a follow-up appointment for your incision check if you do not already have one, 7-10 days post-operatively.   VISIT OUR WEBSITE FOR ADDITIONAL INFORMATION: [email or web address]   HELPFUL INFORMATION  If you had a block, it will wear off between 8-24 hrs postop typically.  This is period when your pain may go from nearly zero to the pain you would have had postop without the block.  This is an abrupt transition but nothing dangerous is happening.  You may take an extra dose of narcotic when this happens.  You should wean off your narcotic medicines as soon as you are able.  Most patients will be off or using minimal narcotics before their first postop appointment.   We suggest you use the pain medication the first night prior to going to bed, in order to ease any pain when the anesthesia wears off. You should avoid taking pain medications on an empty stomach as it  will make you nauseous.  Do not drink alcoholic beverages or take illicit drugs when taking pain medications.  In most states it is against the law to drive while you are in a splint or sling.  And certainly against the law to drive while taking narcotics.  You may return to work/school in the next couple of days when you feel up to it.   Pain medication may make you constipated.  Below are a few solutions to try in this order: Decrease the amount of pain medication if you aren't having pain. Drink lots of decaffeinated fluids. Drink prune juice and/or each dried prunes  If the first 3 don't work start with additional solutions Take Colace - an over-the-counter stool softener Take Senokot - an over-the-counter laxative Take Miralax - a stronger over-the-counter  laxative

## 2023-04-07 NOTE — Op Note (Signed)
 Orthopaedic Surgery Operative Note (CSN: [id number] ) Date of Surgery: 04/07/2023  Admit Date: 04/05/2023   Diagnoses: Pre-Op Diagnoses: Right segmental tibia fracture Right distal fibula fracture External fixation of right leg  Post-Op Diagnosis: Same  Procedures: CPT 27759-Intramedullary nailing of right segmental tibia fracture CPT 27784-Open reduction internal fixation of right fibula fracture CPT 20694-Removal of external fixation right leg  Surgeons : Primary: Roby Lofts, MD  Assistant: Thyra Breed, PA-C  Location: OR 3   Anesthesia:General   Antibiotics: Ancef 2g preop with 1 gm vancomycin powder placed topically   Tourniquet time: None    Estimated Blood Loss: 100 mL  Complications:* No complications entered in OR log *   Specimens:* No specimens in log *   Implants: Implant Name Type Inv. Item Serial No. Manufacturer Lot No. LRB No. Used Action  Affixus Tibial Nail    [employer](ORTH,TRAU,BIO,SG) [id number] Right 1 Implanted  SCREW CORTICAL BONE 5X48 ST - [id number] Screw SCREW CORTICAL BONE 5X48 ST  [employer](ORTH,TRAU,BIO,SG) [id number] Right 1 Implanted  SCREW CORT ST AFFIX 5X46 - [id number] Screw SCREW CORT ST AFFIX 5X46  [employer](ORTH,TRAU,BIO,SG) [id number] Right 1 Implanted  SCREW CORT AFFIX ST 5X40 - [id number] Screw SCREW CORT AFFIX ST 5X40  [employer](ORTH,TRAU,BIO,SG) [id number] Right 1 Implanted  SCREW CORT AFFIX ST 5X52 - [id number] Screw SCREW CORT AFFIX ST 5X52  [employer]ON(ORTH,TRAU,BIO,SG) [id number] Right 1 Implanted  SCREW CORTICAL BONE 5X30 ST - [id number] Screw SCREW CORTICAL BONE 5X30 ST  [employer]ON(ORTH,TRAU,BIO,SG) [id number] Right 1 Implanted  SCREW NON-LOCK 3.5X12 - [id number] Screw SCREW NON-LOCK 3.5X12  [employer](ORTH,TRAU,BIO,SG)  Right 2 Implanted  SCREW NLOCK ALPS 3.5X14 - [id number] Screw SCREW NLOCK ALPS 3.5X14  [employer](ORTH,TRAU,BIO,SG)  Right 2 Implanted  SCREW LOCK MDS 2.7X14 - [id number] Screw SCREW LOCK MDS  2.7X14  [employer](ORTH,TRAU,BIO,SG)  Right 1 Implanted  SCREW LOCK MDS 2.7X16 - [id number] Screw SCREW LOCK MDS 2.7X16  [employer](ORTH,TRAU,BIO,SG)  Right 2 Implanted  SCREW LOCK MDS 2.7X18 - [id number] Screw SCREW LOCK MDS 2.7X18  [employer](ORTH,TRAU,BIO,SG)  Right 2 Implanted  Anatomic Fibula Plate 12 Hole    [employer](ORTH,TRAU,BIO,SG)  Right 1 Implanted     Indications for Surgery: 68 year old female who sustained a ground-level fall with a segmental right tibia fracture and a comminuted left fibular fracture.  Due to the unstable nature of her injury she underwent external fixation initially and due to the complexity I was asked to take over her care.  Risks and benefits were discussed with the patient's daughter and the patient.  Risks include but not limited to bleeding, infection, malunion, nonunion, hardware failure, hardware rotation, nerve and blood vessel injury, DVT, even the possibility anesthetic complications.  They agreed to proceed with surgery and consent was obtained.  Operative Findings: 1.  Intramedullary nailing of right segmental tibial shaft fracture using [employer] Affixus 10 x 340 mm nail 2.  Open reduction internal fixation of distal fibular shaft fracture using [employer] MVX anatomic and distal fibular locking plate 3.  Will do external fixation from right lower extremity.  Procedure: The patient was identified in the preoperative holding area. Consent was confirmed with the patient and their family and all questions were answered. The operative extremity was marked after confirmation with the patient. she was then brought back to the operating room by our anesthesia colleagues.  She was placed under general anesthetic and carefully transferred over to radiolucent flattop table.  A bump was placed under her operative hip.  The  external fixation was removed.  The right lower extremity was then prepped and draped in usual sterile fashion.  A timeout was  performed the patient, the procedure, and the extremity.  Preoperative antibiotics were dosed.  Fluoroscopic imaging showed the unstable nature of her injury.  Lateral parapatellar incision was then made and carried down through skin subcutaneous tissue.  An appropriate starting point and advanced a threaded guidewire into the proximal metaphysis.  I then used a awl to enter the medullary canal.  I then passed a ball-tipped guidewire down the center canal and used a finger reduction tool to pass it across the proximal fracture across the intercalary fracture and into the distal segment.  I seated until it was metaphyseal scar.  I then measured the length and chose to use a 340 mm nail.  I then sequentially reamed from 8 mm to 11 mm and placed a 10 x 340 mm nail.  Alignment was maintained while I placed a distal interlocking screw from medial to lateral.  I then placed a oblique screw proximal medial to distal lateral.  Decent fixation was obtained.  I then used the targeting arm to place 2 proximal interlock screws.  Due to her bone quality and her potential for noncompliance I felt that fibular fixation would be most appropriate.  She had a highly comminuted fibula fracture which I felt that bridge plating would be appropriate.  Centimeters small incision distal to the fracture site and a small incision proximal to the fracture site and carried it down through bone.  I then chose a [employer] MVX anatomic distal fibular plate and slid this submuscularly along the lateral fibula.  I then drilled and placed nonlocking screw proximally and drilled and placed a lag screw distally.  I then proceeded to place locking screws distally and locking screws proximally to bridge fixation.  Final fluoroscopic imaging was obtained.  The incisions were irrigated and closed with 2-0 Monocryl and 3-0 nylon sterile dressings were applied.  The patient was then placed in a short leg splint and awoke from anesthesia and taken  to the PACU in stable condition.  Post Op Plan/Instructions: The patient will be nonweightbearing to the right lower extremity.  She will receive postoperative Ancef.  She will be placed on Lovenox DVT prophylaxis and discharged on aspirin 325 mg.  We will have her mobilize with physical and Occupational Therapy.  I was present and performed the entire surgery.  Thyra Breed, PA-C did assist me throughout the case. An assistant was necessary given the difficulty in approach, maintenance of reduction and ability to instrument the fracture.   Truitt Merle, MD [facility]

## 2023-04-07 NOTE — Progress Notes (Signed)
 Orthopedic Surgery Progress Note   Assessment: Patient is a 68 y.o. female with right segmental tibia fracture status post external fixation   Plan: -Patient was walking on her ex-fix last night. She was instructed by nursing not to walk on it. I also told her again this morning that she is not supposed to be walking on the right leg and the ex-fix is not designed to bear weight through -Planning for ex-fix removal and definitive fixation with Dr. Jena Gauss today -Diet: regular -DVT ppx: lovenox -Antibiotics: ancef x2 post-op doses -Weight bearing status: NWB RLE -PT evaluate and treat -Pain control -Dispo: remain floor status  ___________________________________________________________________________  Subjective: Was walking around her room and bearing weight through her right leg. No other events last night. Having pain in the right leg. No pain elsewhere. Denies paresthesias and numbness.    Physical Exam:  General: no acute distress, appears stated age, laying in bed Neurologic: alert, answering questions appropriately, following commands Respiratory: unlabored breathing   MSK:    -Right lower extremity  External fixator in place over leg, dressings around pins are c/d/i EHL/TA/GSC intact No increased pain with passive stretch at the ankle Plantarflexes and dorsiflexes toes Sensation intact to light touch in sural, saphenous, tibial, deep peroneal, and superficial peroneal nerve distributions Foot warm and well perfused, palpable DP pulse   Patient name: Rachel [name] Patient MRN: [id number] Date: 04/07/23

## 2023-04-07 NOTE — Anesthesia Postprocedure Evaluation (Signed)
 Anesthesia Post Note  Patient: Rachel [name]  Procedure(s) Performed: INSERTION, INTRAMEDULLARY ROD, TIBIA (Right: Leg Lower) REMOVAL, EXTERNAL FIXATION DEVICE, LOWER EXTREMITY (Right: Leg Lower)     Patient location during evaluation: PACU Anesthesia Type: General Level of consciousness: awake and alert Pain management: pain level controlled Vital Signs Assessment: post-procedure vital signs reviewed and stable Respiratory status: spontaneous breathing, nonlabored ventilation, respiratory function stable and patient connected to nasal cannula oxygen Cardiovascular status: blood pressure returned to baseline and stable Postop Assessment: no apparent nausea or vomiting Anesthetic complications: no   No notable events documented.  Last Vitals:  Vitals:   04/07/23 1445 04/07/23 1505  BP: 129/86 130/82  Pulse: 75 75  Resp: (!) 21 17  Temp:  36.6 C  SpO2: 96%     Last Pain:  Vitals:   04/07/23 1505  TempSrc: Oral  PainSc:                  [name]

## 2023-04-07 NOTE — Consult Note (Signed)
 Orthopaedic Trauma Service (OTS) Consult   Patient ID: Rachel [name] MRN: [id number] DOB/AGE: 1955-07-31 68 y.o.  Reason for Consult:Right distal tibia/fibula fracture Referring Physician: Dr. Delmer Islam, MD Cyndia Skeeters  HPI: Rachel [name] is an 68 y.o. female who is being seen in consultation at the request of Dr. Christell Constant for evaluation of right lower extremity fracture.  Patient has a segmental tibial shaft fracture with a fibula fracture.  Due to the complexity of this injury Dr. Christell Constant felt that this is outside the scope of practice and required treatment by an orthopedic traumatologist.  Patient was seen and evaluated in preoperative holding area.  Patient is confused and has baseline confusion.  She lives at home with her daughter.  I did discuss with her daughter over the phone.  Patient was apparently ambulating on her external fixator last night.  Patient does not really appropriately follow history with me today.  Past Medical History:  Diagnosis Date   Chronic confusion    well documented in the medical record   Hepatitis C    Hypertension     Past Surgical History:  Procedure Laterality Date   EXTERNAL FIXATION LEG Right 04/05/2023   Procedure: Application of external fixator Right leg;  Surgeon: London Sheer, MD;  Location: [facility] OR;  Service: Orthopedics;  Laterality: Right;   KNEE ARTHROSCOPY      History reviewed. No pertinent family history.  Social History:  reports that she has been smoking cigarettes. She has never used smokeless tobacco. She reports current alcohol use. She reports current drug use. Frequency: 1.00 time per week. Drug: Marijuana.  Allergies:  Allergies  Allergen Reactions   Tetracycline Hives   Penicillins Rash   Tetracyclines & Related Rash    Medications:  No current facility-administered medications on file prior to encounter.   No current outpatient medications on file prior to encounter.     ROS: Constitutional: No fever or  chills Vision: No changes in vision ENT: No difficulty swallowing CV: No chest pain Pulm: No SOB or wheezing GI: No nausea or vomiting GU: No urgency or inability to hold urine Skin: No poor wound healing Neurologic: No numbness or tingling Psychiatric: No depression or anxiety Heme: No bruising Allergic: No reaction to medications or food   Exam: Blood pressure (!) 152/115, pulse 89, temperature 99.1 F (37.3 C), resp. rate 18, height 5\' 6"  (1.676 m), weight 64.3 kg, SpO2 96%. General: No acute distress Orientation: Awake and alert Mood and Affect: Slightly agitated Gait: Unable to assess due to her fractures Coordination and balance: Within normal limits  Right lower extremity: Ex-Fix in place that is clean dry and intact.  Compartments are soft compressible.  She is will wiggle her toes and she endorses sensation to the dorsum and plantar aspect of her foot.  She is warm well-perfused foot with 2+ DP pulses.  Left lower extremity: Skin without lesions. No tenderness to palpation. Full painless ROM, full strength in each muscle groups without evidence of instability.   Medical Decision Making: Data: Imaging: X-rays and CT scan are reviewed which shows a segmental distal tibia fracture with extra-articular involvement of the distal tibia and a comminuted distal fibula fracture.  Labs: No results found for this or any previous visit (from the past 24 hours).   Imaging or Labs ordered: None  Medical history and chart was reviewed and case discussed with medical provider.  Assessment/Plan: 68 year old female with right distal tibia and fibula fracture status post external fixation.  We will plan to proceed with surgical fixation of her right leg likely with intramedullary nailing and open reduction internal fixation of the fibula.  Risk and benefits were discussed with the patient's daughter as well as the patient.  Risks include but not limited to bleeding, infection,  malunion, nonunion, hardware failure, hardware irritation, nerve and blood vessel injury, DVT, even the possibility anesthetic complications.  They agreed to proceed with surgery and consent was obtained.  Roby Lofts, MD [facility] [phone] (office) [email or web address]

## 2023-04-08 ENCOUNTER — Encounter (HOSPITAL_COMMUNITY): Payer: Self-pay | Admitting: Student

## 2023-04-08 LAB — BASIC METABOLIC PANEL
Anion gap: 5 (ref 5–15)
BUN: 20 mg/dL (ref 8–23)
CO2: 26 mmol/L (ref 22–32)
Calcium: 7.6 mg/dL — ABNORMAL LOW (ref 8.9–10.3)
Chloride: 103 mmol/L (ref 98–111)
Creatinine, Ser: 0.93 mg/dL (ref 0.44–1.00)
GFR, Estimated: >60 mL/min (ref >60)
Glucose, Bld: 141 mg/dL — ABNORMAL HIGH (ref 70–99)
Potassium: 4.1 mmol/L (ref 3.5–5.1)
Sodium: 134 mmol/L — ABNORMAL LOW (ref 135–145)

## 2023-04-08 LAB — CBC
HCT: 31.1 % — ABNORMAL LOW (ref 36.0–46.0)
Hemoglobin: 10.3 g/dL — ABNORMAL LOW (ref 12.0–15.0)
MCH: 28.5 pg (ref 26.0–34.0)
MCHC: 33.1 g/dL (ref 30.0–36.0)
MCV: 86.1 fL (ref 80.0–100.0)
Platelets: 274 10*3/uL (ref 150–400)
RBC: 3.61 MIL/uL — ABNORMAL LOW (ref 3.87–5.11)
RDW: 13.5 % (ref 11.5–15.5)
WBC: 9.4 10*3/uL (ref 4.0–10.5)
nRBC: 0 % (ref 0.0–0.2)

## 2023-04-08 MED ORDER — VITAMIN D (ERGOCALCIFEROL) 1.25 MG (50000 UNIT) PO CAPS
50000.0000 [IU] | ORAL_CAPSULE | ORAL | Status: DC
  Administered 2023-04-08: 50000 [IU] via ORAL
  Filled 2023-04-08: qty 1

## 2023-04-08 MED ORDER — HYDROMORPHONE HCL 1 MG/ML IJ SOLN: 0.5000 mg | INTRAMUSCULAR | Status: DC | PRN

## 2023-04-08 NOTE — Progress Notes (Signed)
 Physical Therapy Treatment Patient Details Name: Rachel [name] MRN: [id number] DOB: 23-Oct-1955 Today's Date: 04/08/2023   History of Present Illness Patient is a 68 year old female with ground level fall, found to have right segmental tibia fracture. S/p external fixator application and closed reduction with manipulation of right tibia fracture.    PT Comments  Pt received in supine, pleasantly confused and c/o RLE pain but not yet due for pain meds per RN. Pt with good participation in supine/seated BLE exercises for ROM/strengthening and with good effort throughout during transfer training. Pt needing mod cues and up to modA for transfers, defer gait due to pt c/o increased RLE pain in standing and pt not yet able to maintain attention while standing for pre-gait tasks. Pt agreeable to sit up in recliner 1-2 hours at end of session, RN/NT notified of safe technique for return transfer back to bed later in the day. Patient will benefit from continued inpatient follow up therapy, <3 hours/day    If plan is discharge home, recommend the following: A lot of help with walking and/or transfers;A lot of help with bathing/dressing/bathroom;Help with stairs or ramp for entrance;Supervision due to cognitive status;Assistance with cooking/housework;Direct supervision/assist for medications management;Direct supervision/assist for financial management;Assist for transportation   Can travel by private vehicle     No  Equipment Recommendations  Other (comment) (to be determined at next level of care; currently she would need wheelchair and RW and ramp for stairs)    Recommendations for Other Services OT consult     Precautions / Restrictions Precautions Precautions: Fall Recall of Precautions/Restrictions: Impaired Restrictions Weight Bearing Restrictions Per Provider Order: Yes RLE Weight Bearing Per Provider Order: Non weight bearing     Mobility  Bed Mobility Overal bed mobility: Needs  Assistance Bed Mobility: Supine to Sit     Supine to sit: Min assist, HOB elevated, Used rails     General bed mobility comments: verbal cues for sequencing and technique, minA just for RLE assist to EOB due to pt c/o pain    Transfers Overall transfer level: Needs assistance Equipment used: Rolling walker (2 wheels) Transfers: Sit to/from Stand Sit to Stand: Mod assist, From elevated surface, Via lift equipment           General transfer comment: mild difficulty sequencing initially with cues required for RLE positioning to maintain non-weight bearing status, EOB>Stedy and Stedy>chair, pt needs reminders not to let go of Stedy rail while being wheeled around to chair. Transfer via Lift Equipment: Stedy  Ambulation/Gait               General Gait Details: not attempted due to limited standing tolerance and pt c/o pain   Stairs             Wheelchair Mobility     Tilt Bed    Modified Rankin (Stroke Patients Only)       Balance Overall balance assessment: Needs assistance, History of Falls Sitting-balance support: Feet supported Sitting balance-Leahy Scale: Good Sitting balance - Comments: patient able to weight shift while sitting without loss of balance with no UE support and when cued to use lotion on her legs d/t feeling itchy, she reaches down to L ankle without LOB   Standing balance support: Bilateral upper extremity supported, Reliant on assistive device for balance Standing balance-Leahy Scale: Poor Standing balance comment: external support required for standing  Communication Communication Communication: No apparent difficulties  Cognition Arousal: Alert Behavior During Therapy: WFL for tasks assessed/performed   PT - Cognitive impairments: History of cognitive impairments, Orientation, Memory, Sequencing, Problem solving, Safety/Judgement   Orientation impairments: Place, Time, Situation                    PT - Cognition Comments: Patient is pleasantly confused but cooperative with session. 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 Following commands: Impaired Following commands impaired: Follows one step commands with increased time    Cueing Cueing Techniques: Verbal cues, Tactile cues, Visual cues  Exercises General Exercises - Lower Extremity Long Arc Quad: AROM, Right, 10 reps, Seated Hip ABduction/ADduction: AROM, Both, 5 reps, Supine, AAROM (AA on RLE) Straight Leg Raises: AAROM, Right, 5 reps, Supine    General Comments General comments (skin integrity, edema, etc.): pt c/o feeling itchy and frequently scratching at areas of skin that appear dry, pt given wet washcloth to use instead and lotion and reports it helped      Pertinent Vitals/Pain Pain Assessment Pain Assessment: Faces Faces Pain Scale: Hurts even more Pain Location: right leg (mostly knee) with AROM and transfers Pain Descriptors / Indicators: Discomfort, Grimacing, Guarding Pain Intervention(s): Limited activity within patient's tolerance, Monitored during session, Premedicated before session, Repositioned, Patient requesting pain meds-RN notified, Ice applied    Home Living                          Prior Function            PT Goals (current goals can now be found in the care plan section) Acute Rehab PT Goals Patient Stated Goal: daughter is requesting rehab PT Goal Formulation: With family Time For Goal Achievement: 04/20/23 Progress towards PT goals: Progressing toward goals    Frequency    Min 2X/week      PT Plan      Co-evaluation              AM-PAC PT "6 Clicks" Mobility   Outcome Measure  Help needed turning from your back to your side while in a flat bed without using bedrails?: A Little Help needed moving from lying on your back to sitting on the side of a  flat bed without using bedrails?: A Lot (without rails) Help needed moving to and from a bed to a chair (including a wheelchair)?: A Lot Help needed standing up from a chair using your arms (e.g., wheelchair or bedside chair)?: A Lot Help needed to walk in hospital room?: Total Help needed climbing 3-5 steps with a railing? : Total 6 Click Score: 11    End of Session Equipment Utilized During Treatment: Gait belt Activity Tolerance: Patient tolerated treatment well;Patient limited by pain Patient left: with call bell/phone within reach;in chair;with chair alarm set (PTA obtained green box and cord from another room as no wall cord for secretary notification was in her room or in dresser in room) Nurse Communication: Mobility status PT Visit Diagnosis: Difficulty in walking, not elsewhere classified (R26.2);History of falling (Z91.81)     Time: 1610-9604 PT Time Calculation (min) (ACUTE ONLY): 34 min  Charges:    $Therapeutic Exercise: 8-22 mins $Therapeutic Activity: 8-22 mins PT General Charges $$ ACUTE PT VISIT: 1 Visit  Florina Ou., PTA Acute Rehabilitation Services Secure Chat Preferred 9a-5:30pm Office: [phone]    Angus Palms 04/08/2023, 5:37 PM

## 2023-04-08 NOTE — Progress Notes (Signed)
 Orthopaedic Trauma Progress Note  SUBJECTIVE: Doing okay this morning.  Pain fairly well-controlled.  Tolerating her splint well.  Patient noted to be extremely agitated yesterday afternoon following surgery, required single dose of Haldol.  Doing better this morning.  Denies any significant numbness or tingling throughout the right lower extremity.  Has not been out of bed yet since surgery.  Wants to know how she broke her leg.  No chest pain. No SOB. No nausea/vomiting. No other complaints.   Prior to admission, patient was living at home alone.  She is agreeable to a SNF at discharge  OBJECTIVE:  Vitals:   04/08/23 0440 04/08/23 0814  BP: (!) 159/81 124/70  Pulse: 77 72  Resp: 18 17  Temp: 98 F (36.7 C) 97.8 F (36.6 C)  SpO2: 92% 96%    General: Sitting up in bed, no acute distress Respiratory: No increased work of breathing.  Right lower extremity: Well-padded, well-fitting splint in place.  Some soreness about the knee around her incision, as expected.  Nontender through the thigh.  Endorses sensation light touch over the toes.  Able to wiggle the toes on her own.  Toes warm and well-perfused  IMAGING: Stable post op imaging.   LABS:  Results for orders placed or performed during the hospital encounter of 04/05/23 (from the past 24 hours)  CBC     Status: Abnormal   Collection Time: 04/08/23  7:00 AM  Result Value Ref Range   WBC 9.4 4.0 - 10.5 K/uL   RBC 3.61 (L) 3.87 - 5.11 MIL/uL   Hemoglobin 10.3 (L) 12.0 - 15.0 g/dL   HCT 16.1 (L) 09.6 - 04.5 %   MCV 86.1 80.0 - 100.0 fL   MCH 28.5 26.0 - 34.0 pg   MCHC 33.1 30.0 - 36.0 g/dL   RDW 40.9 81.1 - 91.4 %   Platelets 274 150 - 400 K/uL   nRBC 0.0 0.0 - 0.2 %  Basic metabolic panel     Status: Abnormal   Collection Time: 04/08/23  7:00 AM  Result Value Ref Range   Sodium 134 (L) 135 - 145 mmol/L   Potassium 4.1 3.5 - 5.1 mmol/L   Chloride 103 98 - 111 mmol/L   CO2 26 22 - 32 mmol/L   Glucose, Bld 141 (H) 70 - 99  mg/dL   BUN 20 8 - 23 mg/dL   Creatinine, Ser 7.82 0.44 - 1.00 mg/dL   Calcium 7.6 (L) 8.9 - 10.3 mg/dL   GFR, Estimated >95 >62 mL/min   Anion gap 5 5 - 15    ASSESSMENT: Rachel [name] is a 68 y.o. female, 1 Day Post-Op s/p INTRAMEDULLARY NAIL RIGHT TIBIA FRACTURE  ORIF RIGHT DISTAL FIBULA FRACTURE  REMOVAL OF EXTERNAL FIXATION RIGHT LOWER EXTREMITY   CV/Blood loss: Acute blood loss anemia, Hgb 10.3 this morning. Hemodynamically stable  PLAN: Weightbearing: NWB RLE ROM: Okay for unrestricted knee ROM.  Maintain splint Incisional and dressing care: Dressings left intact until follow-up  Showering: Okay to begin showering, keep splint covered and dry Orthopedic device(s): Splint RLE Pain management:  1. Tylenol 1000 mg q 8 hours scheduled 2. Robaxin 500 mg QID 3. Oxycodone 5-10 mg q 4 hours PRN 4. Dilaudid 0.5-1 mg q 4 hours PRN VTE prophylaxis: Lovenox, SCDs ID:  Ancef 2gm post op Foley/Lines:  No foley, KVO IVFs Impediments to Fracture Healing: Vitamin D level 7, will start supplementation today Dispo: PT/OT evaluation today, will likely require SNF at discharge.  D/C recommendations: -Oxycodone and Robaxin for pain control -Aspirin 325 mg daily x 30 days for DVT prophylaxis -Continue 50,000 units Vit D supplementation weekly x 8 weeks  Follow - up plan: 2 weeks after discharge for wound check and repeat x-rays   Contact information:  Truitt Merle MD, Thyra Breed PA-C. After hours and holidays please check [email or web address] for group call information for [facility]   Thompson Caul, PA-C [phone] (office) [email or web address]

## 2023-04-08 NOTE — Plan of Care (Signed)
  Problem: Coping: Goal: Level of anxiety will decrease Outcome: Not Progressing   Problem: Elimination: Goal: Will not experience complications related to bowel motility Outcome: Not Progressing Goal: Will not experience complications related to urinary retention Outcome: Not Progressing   Problem: Pain Managment: Goal: General experience of comfort will improve and/or be controlled Outcome: Not Progressing   Problem: Safety: Goal: Ability to remain free from injury will improve Outcome: Not Progressing

## 2023-04-08 NOTE — Care Management Important Message (Signed)
 Important Message  Patient Details  Name: [name] MRN: [id number] Date of Birth: 1955-12-22   Important Message Given:  Yes - Medicare IM     Sherilyn Banker 04/08/2023, 2:39 PM

## 2023-04-08 NOTE — Progress Notes (Signed)
 There was a consult for placing a PIV access. Both arm were multiple spots with ecchymosis. There was no suitable veins both arms by [place]. Unsuccessful only one suitable vein on Lt. Anterior forearm. Informed patient's RN regarding this matter and suggested to change po Ancef. She will contact to MD. Lahoma Rocker RN

## 2023-04-08 NOTE — TOC Progression Note (Addendum)
 Transition of Care [facility]) - Progression Note    Patient Details  Name: Rachel [name] MRN: [id number] Date of Birth: 01-26-1956  Transition of Care [facility]) CM/SW Contact  Carley Hammed, LCSW Phone Number: 04/08/2023, 11:47 AM  Clinical Narrative:    [facility] by [facility] that [facility] can [facility] pt tomorrow in a private room. CSW requested CMA start authorization for pt. TOC continues to follow.   Certified In Total [phone] 3/7 - 3/13   Expected Discharge Plan: Skilled Nursing Facility Barriers to Discharge: Continued Medical Work up, English as a second language teacher, SNF Pending bed offer  Expected Discharge Plan and Services In-house Referral: Clinical Social Work   Post Acute Care Choice: Skilled Nursing Facility Living arrangements for the past 2 months: Single Family Home                                       Social Determinants of Health (SDOH) Interventions SDOH Screenings   Food Insecurity: Patient Declined (04/06/2023)  Housing: Unknown (04/06/2023)  Transportation Needs: No Transportation Needs (04/06/2023)  Utilities: Not At Risk (04/06/2023)  Alcohol Screen: Low Risk  (12/12/2016)  Social Connections: Unknown (04/06/2023)  Tobacco Use: High Risk (04/07/2023)    Readmission Risk Interventions     No data to display

## 2023-04-09 DIAGNOSIS — S82831A Other fracture of upper and lower end of right fibula, initial encounter for closed fracture: Secondary | ICD-10-CM

## 2023-04-09 LAB — CBC
HCT: 30 % — ABNORMAL LOW (ref 36.0–46.0)
Hemoglobin: 9.8 g/dL — ABNORMAL LOW (ref 12.0–15.0)
MCH: 28.2 pg (ref 26.0–34.0)
MCHC: 32.7 g/dL (ref 30.0–36.0)
MCV: 86.2 fL (ref 80.0–100.0)
Platelets: 298 10*3/uL (ref 150–400)
RBC: 3.48 MIL/uL — ABNORMAL LOW (ref 3.87–5.11)
RDW: 13.6 % (ref 11.5–15.5)
WBC: 7.3 10*3/uL (ref 4.0–10.5)
nRBC: 0 % (ref 0.0–0.2)

## 2023-04-09 LAB — BASIC METABOLIC PANEL
Anion gap: 11 (ref 5–15)
BUN: 20 mg/dL (ref 8–23)
CO2: 25 mmol/L (ref 22–32)
Calcium: 8 mg/dL — ABNORMAL LOW (ref 8.9–10.3)
Chloride: 100 mmol/L (ref 98–111)
Creatinine, Ser: 1 mg/dL (ref 0.44–1.00)
GFR, Estimated: >60 mL/min (ref >60)
Glucose, Bld: 115 mg/dL — ABNORMAL HIGH (ref 70–99)
Potassium: 3.8 mmol/L (ref 3.5–5.1)
Sodium: 136 mmol/L (ref 135–145)

## 2023-04-09 MED ORDER — VITAMIN D (ERGOCALCIFEROL) 1.25 MG (50000 UNIT) PO CAPS: 50000.0000 [IU] | ORAL_CAPSULE | ORAL | 0 refills | Status: AC

## 2023-04-09 MED ORDER — METHOCARBAMOL 500 MG PO TABS: 500.0000 mg | ORAL_TABLET | Freq: Four times a day (QID) | ORAL | 0 refills | Status: AC | PRN

## 2023-04-09 MED ORDER — ASPIRIN 325 MG PO TBEC: 325.0000 mg | DELAYED_RELEASE_TABLET | Freq: Every day | ORAL | 0 refills | Status: AC

## 2023-04-09 MED ORDER — OXYCODONE-ACETAMINOPHEN 5-325 MG PO TABS: 1.0000 | ORAL_TABLET | ORAL | 0 refills | Status: AC | PRN

## 2023-04-09 MED ORDER — ACETAMINOPHEN 325 MG PO TABS
650.0000 mg | ORAL_TABLET | Freq: Three times a day (TID) | ORAL | Status: AC | PRN
Start: 2023-04-09 — End: ?

## 2023-04-09 MED ORDER — ONDANSETRON HCL 4 MG PO TABS: 4.0000 mg | ORAL_TABLET | Freq: Four times a day (QID) | ORAL | 0 refills | Status: AC | PRN

## 2023-04-09 NOTE — Plan of Care (Signed)
   Problem: Education: Goal: Knowledge of General Education information will improve Description Including pain rating scale, medication(s)/side effects and non-pharmacologic comfort measures Outcome: Progressing   Problem: Health Behavior/Discharge Planning: Goal: Ability to manage health-related needs will improve Outcome: Progressing

## 2023-04-09 NOTE — TOC Transition Note (Signed)
 Transition of Care [facility]) - Discharge Note   Patient Details  Name: Rachel [name] MRN: [id number] Date of Birth: 11-14-1955  Transition of Care [facility]) CM/SW Contact:  Carley Hammed, LCSW Phone Number: 04/09/2023, 9:56 AM   Clinical Narrative:    Pt to be transported to [facility], [place] via [place]. Nurse to call report to [phone]. Rm# [id number].   Final next level of care: Skilled Nursing Facility Barriers to Discharge: Barriers Resolved   Patient Goals and CMS Choice Patient states their goals for this hospitalization and ongoing recovery are:: Pt disoriented and unable to participate in goal setting. CMS Medicare.gov Compare Post Acute Care list provided to:: Patient Represenative (must comment) Choice offered to / list presented to : Adult Children      Discharge Placement              Patient chooses bed at: [facility] Patient to be transferred to facility by: [facility] Name of family member notified: [facility] Patient and family notified of of transfer: 04/09/23  Discharge Plan and Services Additional resources added to the After Visit Summary for   In-house Referral: Clinical Social Work   Post Acute Care Choice: Skilled Nursing Facility                               Social Drivers of Health (SDOH) Interventions SDOH Screenings   Food Insecurity: Patient Declined (04/06/2023)  Housing: Unknown (04/06/2023)  Transportation Needs: No Transportation Needs (04/06/2023)  Utilities: Not At Risk (04/06/2023)  Alcohol Screen: Low Risk  (12/12/2016)  Social Connections: Unknown (04/06/2023)  Tobacco Use: High Risk (04/07/2023)     Readmission Risk Interventions     No data to display

## 2023-04-09 NOTE — Discharge Summary (Signed)
 Orthopaedic Trauma Service (OTS) Discharge Summary   Patient ID: Rachel [name] MRN: [id number] DOB/AGE: [birth year] 67 y.o.  Admit date: 04/05/2023 Discharge date: 04/09/2023  Admission Diagnoses: Right segmental tibia fracture Right distal fibula fracture  Discharge Diagnoses:  Principal Problem:   Displaced segmental fracture of shaft of right tibia, initial encounter for closed fracture Active Problems:   Closed fracture of right distal fibula   Past Medical History:  Diagnosis Date   Chronic confusion    well documented in the medical record   Hepatitis C    Hypertension      Procedures Performed: ORIF right fibula fracture, External Fixation right lower extremity, and Intramedullary Nail right segmental tibia fracture  Discharged Condition: good/stable  Hospital Course: Patient presented to [facility] on 04/05/2023 after sustaining a fall at home, landing on her right leg.  Had obvious deformity and immediate pain in the right leg.  Was unable to weight-bear following this fall.  Initial imaging in the emergency department showed right tibia/fibula fracture.  Orthopedics consult for evaluation and management.  The orthopedic provider at [facility] recommended transfer to [facility] for surgical fixation.  Upon arrival to [facility], patient was evaluated by Dr. Christell Constant and taken to the operating room for external fixation of the right lower extremity.  Patient was admitted to the orthopedic service postoperatively for pain control and definitive surgical planning.  Due to complex nature of the patient's injury, Dr. Christell Constant felt definitive fixation would best be managed by the orthopedic trauma service.  Patient taken back to the operating room by Dr. Jena Gauss on 04/07/2023 for removal of external fixation, intramedullary nailing of the right tibia and ORIF of the right fibula fracture.  Patient tolerated this well without complications.  Placed in a short leg splint  postoperatively.  Begin working physical and Occupational Therapy starting on postoperative day #1.  Restarted on Lovenox for DVT prophylaxis starting on postoperative day #1.  Remainder of patient's hospitalization was dedicated to achieving adequate pain control and increasing mobility in order for her to safely be able to discharge to a SNF. On 04/09/2023, the patient was tolerating diet, working well with therapies, pain well controlled, vital signs stable, dressings clean, dry, intact and felt stable for discharge to SNF. Patient will follow up as below and knows to call with questions or concerns.     Consults: orthopedic surgery  Significant Diagnostic Studies:   Results for orders placed or performed during the hospital encounter of 04/05/23 (from the past week)  Surgical pcr screen   Collection Time: 04/05/23  8:32 PM   Specimen: Nasal Mucosa; Nasal Swab  Result Value Ref Range   MRSA, PCR NEGATIVE NEGATIVE   Staphylococcus aureus NEGATIVE NEGATIVE  VITAMIN D 25 Hydroxy (Vit-D Deficiency, Fractures)   Collection Time: 04/06/23  5:37 AM  Result Value Ref Range   Vit D, 25-Hydroxy 7.61 (L) 30 - 100 ng/mL  Type and screen   Collection Time: 04/06/23  5:37 AM  Result Value Ref Range   ABO/RH(D) A POS    Antibody Screen NEG    Sample Expiration      04/09/2023,2359 Performed at [facility], [address] [place], [place] [address]   HIV Antibody (routine testing w rflx)   Collection Time: 04/06/23  5:40 AM  Result Value Ref Range   HIV Screen 4th Generation wRfx Non Reactive Non Reactive  ABO/Rh   Collection Time: 04/06/23  8:14 AM  Result Value Ref  Range   ABO/RH(D)      A POS Performed at [facility], [address], [place], [place] [address]   CBC   Collection Time: 04/08/23  7:00 AM  Result Value Ref Range   WBC 9.4 4.0 - 10.5 K/uL   RBC 3.61 (L) 3.87 - 5.11 MIL/uL   Hemoglobin 10.3 (L) 12.0 - 15.0 g/dL   HCT 60.4 (L) 54.0 - 98.1 %   MCV 86.1 80.0  - 100.0 fL   MCH 28.5 26.0 - 34.0 pg   MCHC 33.1 30.0 - 36.0 g/dL   RDW 19.1 47.8 - 29.5 %   Platelets 274 150 - 400 K/uL   nRBC 0.0 0.0 - 0.2 %  Basic metabolic panel   Collection Time: 04/08/23  7:00 AM  Result Value Ref Range   Sodium 134 (L) 135 - 145 mmol/L   Potassium 4.1 3.5 - 5.1 mmol/L   Chloride 103 98 - 111 mmol/L   CO2 26 22 - 32 mmol/L   Glucose, Bld 141 (H) 70 - 99 mg/dL   BUN 20 8 - 23 mg/dL   Creatinine, Ser 6.21 0.44 - 1.00 mg/dL   Calcium 7.6 (L) 8.9 - 10.3 mg/dL   GFR, Estimated >30 >86 mL/min   Anion gap 5 5 - 15  CBC   Collection Time: 04/09/23  6:47 AM  Result Value Ref Range   WBC 7.3 4.0 - 10.5 K/uL   RBC 3.48 (L) 3.87 - 5.11 MIL/uL   Hemoglobin 9.8 (L) 12.0 - 15.0 g/dL   HCT 57.8 (L) 46.9 - 62.9 %   MCV 86.2 80.0 - 100.0 fL   MCH 28.2 26.0 - 34.0 pg   MCHC 32.7 30.0 - 36.0 g/dL   RDW 52.8 41.3 - 24.4 %   Platelets 298 150 - 400 K/uL   nRBC 0.0 0.0 - 0.2 %  Basic metabolic panel   Collection Time: 04/09/23  6:47 AM  Result Value Ref Range   Sodium 136 135 - 145 mmol/L   Potassium 3.8 3.5 - 5.1 mmol/L   Chloride 100 98 - 111 mmol/L   CO2 25 22 - 32 mmol/L   Glucose, Bld 115 (H) 70 - 99 mg/dL   BUN 20 8 - 23 mg/dL   Creatinine, Ser 0.10 0.44 - 1.00 mg/dL   Calcium 8.0 (L) 8.9 - 10.3 mg/dL   GFR, Estimated >27 >25 mL/min   Anion gap 11 5 - 15  Results for orders placed or performed during the hospital encounter of 04/05/23 (from the past week)  CBC with Differential   Collection Time: 04/05/23  4:15 PM  Result Value Ref Range   WBC 10.0 4.0 - 10.5 K/uL   RBC 4.86 3.87 - 5.11 MIL/uL   Hemoglobin 13.7 12.0 - 15.0 g/dL   HCT 36.6 44.0 - 34.7 %   MCV 85.6 80.0 - 100.0 fL   MCH 28.2 26.0 - 34.0 pg   MCHC 32.9 30.0 - 36.0 g/dL   RDW 42.5 95.6 - 38.7 %   Platelets 273 150 - 400 K/uL   nRBC 0.0 0.0 - 0.2 %   Neutrophils Relative % 89 %   Neutro Abs 8.8 (H) 1.7 - 7.7 K/uL   Lymphocytes Relative 7 %   Lymphs Abs 0.7 0.7 - 4.0 K/uL   Monocytes  Relative 4 %   Monocytes Absolute 0.4 0.1 - 1.0 K/uL   Eosinophils Relative 0 %   Eosinophils Absolute 0.0 0.0 - 0.5 K/uL  Basophils Relative 0 %   Basophils Absolute 0.0 0.0 - 0.1 K/uL   Immature Granulocytes 0 %   Abs Immature Granulocytes 0.03 0.00 - 0.07 K/uL  Basic metabolic panel   Collection Time: 04/05/23  4:15 PM  Result Value Ref Range   Sodium 138 135 - 145 mmol/L   Potassium 3.4 (L) 3.5 - 5.1 mmol/L   Chloride 102 98 - 111 mmol/L   CO2 25 22 - 32 mmol/L   Glucose, Bld 111 (H) 70 - 99 mg/dL   BUN 13 8 - 23 mg/dL   Creatinine, Ser 0.98 0.44 - 1.00 mg/dL   Calcium 8.5 (L) 8.9 - 10.3 mg/dL   GFR, Estimated >11 >91 mL/min   Anion gap 11 5 - 15     Treatments: IV hydration, antibiotics: Ancef, analgesia: acetaminophen, Dilaudid, and oxycodone, anticoagulation: LMW heparin, therapies: PT and OT, and surgery: As above  Discharge Exam: General: Sitting up in bed eating breakfast, no acute distress.  Alert and oriented to person and place Respiratory: No increased work of breathing at rest Right lower extremity: Well-padded, well-fitting short leg splint in place.  Tender about the knee as expected.  Nontender throughout the thigh.  Able to wiggle the toes.  Endorses sensation light touch of the toes.  The remainder of motor and sensory exam to the foot, ankle, lower leg limited secondary to splint placement.  Toes are warm well-perfused.  Tolerates gentle knee range of motion. + DP pulse   Disposition: Discharge disposition: 03-Skilled Nursing Facility       Discharge Instructions     Call MD / Call 911   Complete by: As directed    If you experience chest pain or shortness of breath, CALL 911 and be transported to the hospital emergency room.  If you develope a fever above 101 F, pus (white drainage) or increased drainage or redness at the wound, or calf pain, call your surgeon's office.   Constipation Prevention   Complete by: As directed    Drink plenty of fluids.   Prune juice may be helpful.  You may use a stool softener, such as Colace (over the counter) 100 mg twice a day.  Use MiraLax (over the counter) for constipation as needed.   Diet - low sodium heart healthy   Complete by: As directed    Increase activity slowly as tolerated   Complete by: As directed    Post-operative opioid taper instructions:   Complete by: As directed    POST-OPERATIVE OPIOID TAPER INSTRUCTIONS: It is important to wean off of your opioid medication as soon as possible. If you do not need pain medication after your surgery it is ok to stop day one. Opioids include: Codeine, Hydrocodone(Norco, Vicodin), Oxycodone(Percocet, oxycontin) and hydromorphone amongst others.  Long term and even short term use of opiods can cause: Increased pain response Dependence Constipation Depression Respiratory depression And more.  Withdrawal symptoms can include Flu like symptoms Nausea, vomiting And more Techniques to manage these symptoms Hydrate well Eat regular healthy meals Stay active Use relaxation techniques(deep breathing, meditating, yoga) Do Not substitute Alcohol to help with tapering If you have been on opioids for less than two weeks and do not have pain than it is ok to stop all together.  Plan to wean off of opioids This plan should start within one week post op of your joint replacement. Maintain the same interval or time between taking each dose and first decrease the dose.  Cut the total  daily intake of opioids by one tablet each day Next start to increase the time between doses. The last dose that should be eliminated is the evening dose.         Allergies as of 04/09/2023       Reactions   Tetracycline Hives   Penicillins Rash   Tetracyclines & Related Rash        Medication List     TAKE these medications    acetaminophen 325 MG tablet Commonly known as: TYLENOL Take 2 tablets (650 mg total) by mouth every 8 (eight) hours as needed for  mild pain (pain score 1-3), fever or headache.   aspirin EC 325 MG tablet Take 1 tablet (325 mg total) by mouth daily.   methocarbamol 500 MG tablet Commonly known as: ROBAXIN Take 1 tablet (500 mg total) by mouth every 6 (six) hours as needed for muscle spasms.   ondansetron 4 MG tablet Commonly known as: ZOFRAN Take 1 tablet (4 mg total) by mouth every 6 (six) hours as needed for nausea.   oxyCODONE-acetaminophen 5-325 MG tablet Commonly known as: Percocet Take 1 tablet by mouth every 4 (four) hours as needed for severe pain (pain score 7-10).   Vitamin D (Ergocalciferol) 1.25 MG (50000 UNIT) Caps capsule Commonly known as: DRISDOL Take 1 capsule (50,000 Units total) by mouth every 7 (seven) days. Start taking on: April 15, 2023        Contact information for follow-up providers     Haddix, Gillie Manners, MD. Schedule an appointment as soon as possible for a visit in 2 week(s).   Specialty: Orthopedic Surgery Why: for wound check and repeat x-rays Contact information: [address] [place] [address] [phone]              Contact information for after-discharge care     Destination     [facility] Preferred SNF .   Service: Skilled Nursing Contact information: [address] [place] [address] [phone]                     Discharge Instructions and Plan: Patient will be discharged to [facility] rehab facility.  Will be discharged on Aspirin 325 mg daily x 30 days for DVT prophylaxis.  Patient will follow up with Dr. Jena Gauss in 2 weeks for repeat x-rays and splint/suture removal.   Signed:  Thompson Caul, PA-C ?[phone]? (phone) 04/09/2023, 9:39 AM  [facility] [address] [place] [address] [phone] Collier Bullock (F)

## 2023-04-09 NOTE — Progress Notes (Signed)
 Frayda A Routt to be D/C'd  per MD order.  Report was given to Marisue Humble, LPN at [facility] and all questions fully answered.  VSS, Skin clean, dry and intact without evidence of skin break down, no evidence of skin tears noted.  An After Visit Summary was printed and given to the [facility]. Paper script was included in d/c packet.   Patient instructed to return to ED, call 911, or call MD for any changes in condition.

## 2023-04-14 MED FILL — Diphenhydramine HCl Inj 50 MG/ML: INTRAMUSCULAR | Qty: 1 | Status: AC

## 2023-06-21 ENCOUNTER — Other Ambulatory Visit: Payer: Self-pay | Admitting: Internal Medicine

## 2023-06-21 DIAGNOSIS — R918 Other nonspecific abnormal finding of lung field: Secondary | ICD-10-CM

## 2023-06-23 ENCOUNTER — Ambulatory Visit

## 2023-07-22 ENCOUNTER — Ambulatory Visit

## 2023-10-03 ENCOUNTER — Emergency Department

## 2023-10-03 ENCOUNTER — Other Ambulatory Visit: Payer: Self-pay

## 2023-10-03 ENCOUNTER — Emergency Department
Admission: EM | Admit: 2023-10-03 | Discharge: 2023-10-03 | Disposition: A | Discharge status: Home / Self Care | Attending: Emergency Medicine | Admitting: Emergency Medicine

## 2023-10-03 DIAGNOSIS — W19XXXA Unspecified fall, initial encounter: Secondary | ICD-10-CM | POA: Diagnosis not present

## 2023-10-03 DIAGNOSIS — S0083XA Contusion of other part of head, initial encounter: Secondary | ICD-10-CM | POA: Diagnosis not present

## 2023-10-03 DIAGNOSIS — M25562 Pain in left knee: Secondary | ICD-10-CM | POA: Insufficient documentation

## 2023-10-03 DIAGNOSIS — S0990XA Unspecified injury of head, initial encounter: Secondary | ICD-10-CM | POA: Diagnosis present

## 2023-10-03 NOTE — ED Triage Notes (Signed)
 Patient C/O fall that occurred yesterday around 1900. Patient denies any pain at this time, or LOC. Per EMS, facility states that patient is at baseline mentation.

## 2023-10-03 NOTE — ED Notes (Signed)
 Pt up to use restroom, one person assist with walker required.

## 2023-10-03 NOTE — Discharge Instructions (Signed)
 You have been seen in the Emergency Department (ED) today for a fall.  Your work up (including CT scan of the head, CT scan of the neck, and x-rays of the left knee) does not show any concerning injuries.  Please take over-the-counter ibuprofen  and/or Tylenol  as needed for your pain (unless you have an allergy or your doctor as told you not to take them), or take any prescribed medication as instructed.  Please follow up with your doctor regarding today's Emergency Department (ED) visit and your recent fall.    Return to the ED if you have any headache, confusion, slurred speech, weakness/numbness of any arm or leg, or any increased pain.

## 2023-10-03 NOTE — ED Provider Notes (Signed)
 [facility] Provider Note    Event Date/Time   First MD Initiated Contact with Patient 10/03/23 0515     (approximate)   History   Fall   HPI Rachel [name] is a 68 y.o. female who presents by EMS from [facility] for evaluation of a fall.  Reportedly she fell sometime around 1900 last night.  Unclear why EMS was not called until just now.  The patient has a contusion along her left eyebrow ridge.  She does not remember the fall.  She reports no other pain and just says she has to go to the bathroom.  She claims that she was asleep when the EMS team arrived to bring her to the hospital.  EMS reports that the doctor at the facility wanted to send her for a CT scan just to make sure she had no intracranial injuries.  She denies neck pain, chest pain, shortness of breath, nausea, vomiting, abdominal pain, and any pain in her arms or her legs.     Physical Exam   ED Triage Vitals  Encounter Vitals Group     BP 10/03/23 0517 (!) 168/83     Girls Systolic BP Percentile --      Girls Diastolic BP Percentile --      Boys Systolic BP Percentile --      Boys Diastolic BP Percentile --      Pulse Rate 10/03/23 0517 73     Resp 10/03/23 0517 18     Temp 10/03/23 0517 97.9 F (36.6 C)     Temp Source 10/03/23 0517 Oral     SpO2 10/03/23 0517 98 %     Weight --      Height --      Head Circumference --      [facility] --      Pain Score 10/03/23 0518 0     Pain Loc --      Pain Education --      Exclude from Growth Chart --       Most recent vital signs: Vitals:   10/03/23 0517 10/03/23 0534  BP: (!) 168/83   Pulse: 73   Resp: 18   Temp: 97.9 F (36.6 C)   SpO2: 98% 96%    General: Awake, no distress.  Chronically poor dentition.  No visible signs of trauma to her head, but she has an area of ecchymosis and hematoma to the left eyebrow ridge that is tender to palpation.  Extraocular motion is intact and pupils are equal and reactive. CV:  Good  peripheral perfusion.  Regular rate and rhythm. Resp:  Normal effort. Speaking easily and comfortably, no accessory muscle usage nor intercostal retractions, though she has some audible wheezing.  However she says that this is baseline for her.  Mild expiratory wheezing on auscultation with stethoscope. Abd:  No distention.  No tenderness to palpation. Other:  No active or passive range of motion of major joints in her arms and her legs, and no visible deformities or evidence of contusion, but the patient is reporting pain below her left knee that she says is new.  However, the patient was able to ambulate to and from the bathroom although at one point her knee buckled a little bit as per staff.   ED Results / Procedures / Treatments   Labs (all labs ordered are listed, but only abnormal results are displayed) Labs Reviewed - No data to display    RADIOLOGY  See ED course for details   PROCEDURES:  Critical Care performed: No  Procedures    IMPRESSION / MDM / ASSESSMENT AND PLAN / ED COURSE  I reviewed the triage vital signs and the nursing notes.                              Differential diagnosis includes, but is not limited to, minor head injury, acute intracranial hemorrhage, cervical spine injury, extremity injury  Patient's presentation is most consistent with acute presentation with potential threat to life or bodily function.  Labs/studies ordered: CT head, CT cervical spine  Interventions/Medications given:  Medications - No data to display  (Note:  hospital course my include additional interventions and/or labs/studies not listed above.)   Patient is in no distress with evidence of a minor forehead contusion.  Given her age I will proceed with CT of the head and CT of the cervical spine, but there is no indication she requires additional evaluation as she has no complaints, signs, and or symptoms.  She has a well-documented history of chronic confusion in her  medical record and she appears to be at her baseline.  I anticipate discharge back to her facility if her CTs are reassuring.  Given her report of acute knee pain, I will also check x-rays out of an abundance of caution, but I have a low suspicion for an orthopedic injury.  Clinical Course as of 10/03/23 [id number]  [place] Oct 03, 2023  0610 I independtly viewed and interpreted the patient's head CT and cervical spine CT, and I also reviewed the radiologist's report(s).  The patient has chronic changes but no evidence of acute CVA nor acute intracranial bleed nor skull fracture.  Confirmed by radiologist. [CF]  [phone] DG Knee Complete 4 Views Left I also viewed and interpreted the patient's knee x-rays and she has no evidence of fracture or dislocation, confirmed by radiology. [CF]  [phone]   The patient's medical screening exam is reassuring with no indication of an emergent medical condition requiring hospitalization or additional evaluation at this point.  The patient is safe and appropriate for discharge and outpatient follow up.  She agrees with the plan. [CF]    Clinical Course User Index [CF] Gordan Huxley, MD     FINAL CLINICAL IMPRESSION(S) / ED DIAGNOSES   Final diagnoses:  Fall, initial encounter  Contusion of forehead, initial encounter  Acute pain of left knee     Rx / DC Orders   ED Discharge Orders     None        Note:  This document was prepared using Dragon voice recognition software and may include unintentional dictation errors.   Gordan Huxley, MD 10/03/23 [phone]

## 2023-10-03 NOTE — ED Notes (Signed)
 Pt stating is hungry, provided with [place] sandwich tray. Pt denied wanting anything to drink.

## 2023-11-09 ENCOUNTER — Other Ambulatory Visit: Payer: Self-pay

## 2023-11-09 ENCOUNTER — Emergency Department
Admission: EM | Admit: 2023-11-09 | Discharge: 2023-11-10 | Disposition: A | Discharge status: Skilled Nursing Facility | Attending: Emergency Medicine | Admitting: Emergency Medicine

## 2023-11-09 ENCOUNTER — Emergency Department

## 2023-11-09 DIAGNOSIS — F02818 Dementia in other diseases classified elsewhere, unspecified severity, with other behavioral disturbance: Secondary | ICD-10-CM | POA: Insufficient documentation

## 2023-11-09 DIAGNOSIS — F3181 Bipolar II disorder: Secondary | ICD-10-CM | POA: Diagnosis present

## 2023-11-09 DIAGNOSIS — I10 Essential (primary) hypertension: Secondary | ICD-10-CM | POA: Diagnosis not present

## 2023-11-09 DIAGNOSIS — F17210 Nicotine dependence, cigarettes, uncomplicated: Secondary | ICD-10-CM | POA: Diagnosis not present

## 2023-11-09 DIAGNOSIS — I6782 Cerebral ischemia: Secondary | ICD-10-CM | POA: Insufficient documentation

## 2023-11-09 DIAGNOSIS — F03918 Unspecified dementia, unspecified severity, with other behavioral disturbance: Secondary | ICD-10-CM | POA: Diagnosis not present

## 2023-11-09 DIAGNOSIS — Z79899 Other long term (current) drug therapy: Secondary | ICD-10-CM | POA: Diagnosis not present

## 2023-11-09 DIAGNOSIS — F0390 Unspecified dementia without behavioral disturbance: Secondary | ICD-10-CM

## 2023-11-09 DIAGNOSIS — R4689 Other symptoms and signs involving appearance and behavior: Secondary | ICD-10-CM

## 2023-11-09 DIAGNOSIS — Z8673 Personal history of transient ischemic attack (TIA), and cerebral infarction without residual deficits: Secondary | ICD-10-CM | POA: Diagnosis not present

## 2023-11-09 LAB — CBC WITH DIFFERENTIAL/PLATELET
Abs Immature Granulocytes: 0.03 K/uL (ref 0.00–0.07)
Basophils Absolute: 0 K/uL (ref 0.0–0.1)
Basophils Relative: 1 %
Eosinophils Absolute: 0.3 K/uL (ref 0.0–0.5)
Eosinophils Relative: 4 %
HCT: 42.6 % (ref 36.0–46.0)
Hemoglobin: 13.6 g/dL (ref 12.0–15.0)
Immature Granulocytes: 0 %
Lymphocytes Relative: 13 %
Lymphs Abs: 1.2 K/uL (ref 0.7–4.0)
MCH: 27 pg (ref 26.0–34.0)
MCHC: 31.9 g/dL (ref 30.0–36.0)
MCV: 84.5 fL (ref 80.0–100.0)
Monocytes Absolute: 0.6 K/uL (ref 0.1–1.0)
Monocytes Relative: 7 %
Neutro Abs: 6.6 K/uL (ref 1.7–7.7)
Neutrophils Relative %: 75 %
Platelets: 398 K/uL (ref 150–400)
RBC: 5.04 MIL/uL (ref 3.87–5.11)
RDW: 14.6 % (ref 11.5–15.5)
WBC: 8.7 K/uL (ref 4.0–10.5)
nRBC: 0 % (ref 0.0–0.2)

## 2023-11-09 LAB — COMPREHENSIVE METABOLIC PANEL WITH GFR
ALT: 21 U/L (ref 0–44)
AST: 21 U/L (ref 15–41)
Albumin: 4 g/dL (ref 3.5–5.0)
Alkaline Phosphatase: 88 U/L (ref 38–126)
Anion gap: 11 (ref 5–15)
BUN: 18 mg/dL (ref 8–23)
CO2: 23 mmol/L (ref 22–32)
Calcium: 9 mg/dL (ref 8.9–10.3)
Chloride: 107 mmol/L (ref 98–111)
Creatinine, Ser: 0.69 mg/dL (ref 0.44–1.00)
GFR, Estimated: >60 mL/min (ref >60)
Glucose, Bld: 128 mg/dL — ABNORMAL HIGH (ref 70–99)
Potassium: 3.4 mmol/L — ABNORMAL LOW (ref 3.5–5.1)
Sodium: 141 mmol/L (ref 135–145)
Total Bilirubin: 0.6 mg/dL (ref 0.0–1.2)
Total Protein: 7.9 g/dL (ref 6.5–8.1)

## 2023-11-09 LAB — ETHANOL: Alcohol, Ethyl (B): <15 mg/dL (ref <15)

## 2023-11-09 MED ORDER — IPRATROPIUM-ALBUTEROL 0.5-2.5 (3) MG/3ML IN SOLN
6.0000 mL | Freq: Once | RESPIRATORY_TRACT | Status: AC
  Administered 2023-11-09: 6 mL via RESPIRATORY_TRACT
  Filled 2023-11-09: qty 6

## 2023-11-09 NOTE — ED Triage Notes (Signed)
 Pt to ED [facility] from [facility] for aggressive behavior with staff today while staff was attempting to apply nicotine  patch. Pt oriented to person at baseline.  Pt calm and cooperative at this time, states I just want a cigarette Wears 2 L [place] at baseline

## 2023-11-09 NOTE — ED Notes (Addendum)
 Lab at bedside, unable to collect labs at this time. Charge RN aware. Dr Levander aware

## 2023-11-09 NOTE — Consult Note (Signed)
 [facility] Consult Note  Patient Name: Rachel [name] MRN: [id number] DOB: Jul 01, 1955 DATE OF Consult: 11/09/2023  PRIMARY PSYCHIATRIC DIAGNOSES  1.  Dementia with behavioral disturbance   RECOMMENDATIONS  Recommendations: Medication recommendations: Recommend against using benzos, including Xanax  and Ativan  due to risk of paradoxical agitation in older adults and those with dementia, worsening cognition and falls. Recommend Seroquel 12.5mg  po nightly for impulse control and 12.5mg  po BID PRN agitation  Non-Medication/therapeutic recommendations: Follow up UA to rule out acute altered mental status; Follow up with outpatient primary provider; crisis line information; ED return precautions  Is inpatient psychiatric hospitalization recommended for this patient? No (Explain why): Patient with episode of agitation, cooperative in ED, per her daughter, patient seems to be at baseline. Patient does not meet criteria for inpatient psychiatric hospitalization  Follow-Up Telepsychiatry C/L services: We will sign off for now. Please re-consult our service if needed for any concerning changes in the patient's condition, discharge planning, or questions. Communication: Treatment team members (and family members if applicable) who were involved in treatment/care discussions and planning, and with whom we spoke or engaged with via secure text/chat, include the following: Dr. Levander and team via Epic chat   Patient with a history of CVA and dementia who presents with episode of agitation. Per daughter, patient intermittently irritable and agitated. Patient does not meet criteria for inpatient psychiatric hospitalization. Recommendations as above.  Thank you for involving us  in the care of this patient. If you have any additional questions or concerns, please call [phone] and ask for me or the provider on-call.  TELEPSYCHIATRY ATTESTATION & CONSENT  As the provider for this telehealth consult, I attest  that I verified the patient's identity using two separate identifiers, introduced myself to the patient, provided my credentials, disclosed my location, and performed this encounter via a HIPAA-compliant, real-time, face-to-face, two-way, interactive audio and video platform and with the full consent and agreement of the patient (or guardian as applicable.)  Patient physical location: ED in [place] [facility]  Telehealth provider physical location: home office in state of [place]    Video start time: 1935 EDT Video end time: 1945 EDT   IDENTIFYING DATA  Rachel [name] is a 68 y.o. year-old female for whom a psychiatric consultation has been ordered by the primary provider. The patient was identified using two separate identifiers.  CHIEF COMPLAINT/REASON FOR CONSULT  Agitation    HISTORY OF PRESENT ILLNESS (HPI)  Rachel [name] is a 68 year old female with a history of bipolar II disorder, opioid use disorder, CVA, reportedly oriented x 1-2 at baseline with chronic confusion and per daughter Alzheimer's disease, who presents from her living facility after she reportedly became agitated when staff were trying to apply a nicotine  patch, CT head negative, Qtc 474. WBC consulted, no UA resulted at time of evaluation. Psychiatry consulted for evaluation and management.   Patient states she doesn't know why she is in the hospital. She reports she lives with her daughter. Patient oriented to person and location, not to date or situation. Patient states she takes no medication and does not know what medical problems she has if any. She reports she is close with her daughter and her daughter's husband. Patient denies depressed mood, other depressive symptoms. Denies symptoms consistent with mania/hypomania, paranoia, auditory and visual hallucinations, homicidal ideation. Denies suicidal ideation, intent, plan. Patient recalls 0/3 words on 5-minute recall. States the current President of the [place]   is Zachary Neighbors.   Spoke to patient's daughter (  [phone]). Patient's facility called her to say that patient was more irritable and agitated today. She states that patient is intermittently agitated and irritable. She states if patient doesn't like what she is being told to do she will get irritated. States patient will call her and ask her to come pick her up and bring her home. She states patient will call her and say Fuck you, bitch. She reports that facility gives her Xanax  and Ativan  as needed for agitation.    PAST PSYCHIATRIC HISTORY  Trauma/abuse/neglect/exploitation: Unable to assess due to patient condition   Otherwise as per HPI above.  PAST MEDICAL HISTORY  Past Medical History:  Diagnosis Date   Chronic confusion    well documented in the medical record   Hepatitis C    Hypertension      HOME MEDICATIONS  PTA Medications  Medication Sig   acetaminophen  (TYLENOL ) 325 MG tablet Take 2 tablets (650 mg total) by mouth every 8 (eight) hours as needed for mild pain (pain score 1-3), fever or headache.   ondansetron  (ZOFRAN ) 4 MG tablet Take 1 tablet (4 mg total) by mouth every 6 (six) hours as needed for nausea.   methocarbamol  (ROBAXIN ) 500 MG tablet Take 1 tablet (500 mg total) by mouth every 6 (six) hours as needed for muscle spasms.   Vitamin D , Ergocalciferol , (DRISDOL ) 1.25 MG (50000 UNIT) CAPS capsule Take 1 capsule (50,000 Units total) by mouth every 7 (seven) days.   oxyCODONE -acetaminophen  (PERCOCET) 5-325 MG tablet Take 1 tablet by mouth every 4 (four) hours as needed for severe pain (pain score 7-10).   amLODipine (NORVASC) 5 MG tablet Take 5 mg by mouth daily.   Calcium  Citrate-Vitamin D3 (CITRACAL MAXIMUM) 315-6.25 MG-MCG TABS Take 2 tablets by mouth daily.   ipratropium-albuterol (DUONEB) 0.5-2.5 (3) MG/3ML SOLN Take 3 mLs by nebulization every 6 (six) hours as needed (wheezing).     ALLERGIES  Allergies  Allergen Reactions   Tetracycline Hives    Penicillins Rash   Tetracyclines & Related Rash    SOCIAL & SUBSTANCE USE HISTORY  Social History   Socioeconomic History   Marital status: Married    Spouse name: Not on file   Number of children: Not on file   Years of education: Not on file   Highest education level: Not on file  Occupational History   Not on file  Tobacco Use   Smoking status: Every Day    Current packs/day: 1.00    Types: Cigarettes   Smokeless tobacco: Never  Substance and Sexual Activity   Alcohol use: Yes    Comment: rarely   Drug use: Yes    Frequency: 1.0 times per week    Types: Marijuana    Comment: former IV drug user per pt   Sexual activity: Not on file  Other Topics Concern   Not on file  Social History Narrative   Not on file   Social Drivers of Health   Financial Resource Strain: Not on file  Food Insecurity: Patient Declined (04/06/2023)   Hunger Vital Sign    Worried About Running Out of Food in the Last Year: Patient declined    Ran Out of Food in the Last Year: Patient declined  Transportation Needs: No Transportation Needs (04/06/2023)   PRAPARE - Administrator, Civil Service (Medical): No    Lack of Transportation (Non-Medical): No  Physical Activity: Not on file  Stress: Not on file  Social Connections: Unknown (04/06/2023)  Social Advertising account executive    Frequency of Communication with Friends and Family: Once a week    Frequency of Social Gatherings with Friends and Family: Once a week    Attends Religious Services: 1 to 4 times per year    Active Member of [employer] or Organizations: Patient declined    Attends Engineer, structural: Patient declined    Marital Status: Married   Social History   Tobacco Use  Smoking Status Every Day   Current packs/day: 1.00   Types: Cigarettes  Smokeless Tobacco Never   Social History   Substance and Sexual Activity  Alcohol Use Yes   Comment: rarely   Social History   Substance and Sexual Activity   Drug Use Yes   Frequency: 1.0 times per week   Types: Marijuana   Comment: former IV drug user per pt     FAMILY HISTORY  History reviewed. No pertinent family history. Family Psychiatric History (if known):  Unable to assess due to patient condition   MENTAL STATUS EXAM (MSE)  Mental Status Exam: General Appearance: Casual  Orientation:  Other:  Oriented to person and location, not to date  Memory:  Recent;   Poor  Concentration:  Concentration: Fair  Recall:  Poor  Attention  Fair  Eye Contact:  Fair  Speech:  Clear and Coherent  Language:  Good  Volume:  Normal  Mood: fine  Affect:  Labile  Thought Process:  Coherent  Thought Content:  Computation  Suicidal Thoughts:  No  Homicidal Thoughts:  No  Judgement:  Impaired  Insight:  Shallow  Psychomotor Activity:  Normal  Akathisia:  NA  Fund of Knowledge:  Poor    Assets:  Housing Social Support  Cognition:  Impaired,  Moderate  ADL's:  Intact  AIMS (if indicated):       VITALS  Blood pressure (!) 145/83, pulse 77, temperature 97.7 F (36.5 C), resp. rate 20, height 5' 6.5 (1.689 m), weight 76.7 kg, SpO2 100%.  LABS  No visits with results within 1 Day(s) from this visit.  Latest known visit with results is:  Admission on 04/05/2023, Discharged on 04/09/2023  Component Date Value Ref Range Status   HIV Screen 4th Generation wRfx 04/06/2023 Non Reactive  Non Reactive Final   Performed at [facility], [address] [place], [place] [address]   MRSA, PCR 04/05/2023 NEGATIVE  NEGATIVE Final   Staphylococcus aureus 04/05/2023 NEGATIVE  NEGATIVE Final   Comment: (NOTE) The Xpert SA Assay (FDA approved for NASAL specimens in patients 31 years of age and older), is one component of a comprehensive surveillance program. It is not intended to diagnose infection nor to guide or monitor treatment. Performed at [facility], [address], [place], [place] [address]    ABO/RH(D) 04/06/2023 A POS    Final   Antibody Screen 04/06/2023 NEG   Final   Sample Expiration 04/06/2023    Final                   Value:04/09/2023,2359 Performed at [facility], [address] [place], [place] [address]    ABO/RH(D) 04/06/2023    Final                   Value:A POS Performed at [facility], [address] [place], [place] [address]    Vit D, 25-Hydroxy 04/06/2023 7.61 (L)  30 - 100 ng/mL Final  Comment: (NOTE) Vitamin D  deficiency has been defined by the Institute of Medicine  and an Endocrine Society practice guideline as a level of serum 25-OH  vitamin D  less than 20 ng/mL (1,2). The Endocrine Society went on to  further define vitamin D  insufficiency as a level between 21 and 29  ng/mL (2).  1. IOM (Institute of Medicine). 2010. Dietary reference intakes for  calcium  and D. [place]: [employer]. 2. Holick MF, Binkley [place], Bischoff-Ferrari HA, et al. Evaluation,  treatment, and prevention of vitamin D  deficiency: an Endocrine  Society clinical practice guideline, JCEM. 2011 Jul; 96(7): 1911-30.  Performed at [facility], [address], [place], [place] [address]    WBC 04/08/2023 9.4  4.0 - 10.5 K/uL Final   RBC 04/08/2023 3.61 (L)  3.87 - 5.11 MIL/uL Final   Hemoglobin 04/08/2023 10.3 (L)  12.0 - 15.0 g/dL Final   HCT 96/93/7974 31.1 (L)  36.0 - 46.0 % Final   MCV 04/08/2023 86.1  80.0 - 100.0 fL Final   MCH 04/08/2023 28.5  26.0 - 34.0 pg Final   MCHC 04/08/2023 33.1  30.0 - 36.0 g/dL Final   RDW 96/93/7974 13.5  11.5 - 15.5 % Final   Platelets 04/08/2023 274  150 - 400 K/uL Final   nRBC 04/08/2023 0.0  0.0 - 0.2 % Final   Performed at [facility], [address] [place], [place] [address]   Sodium 04/08/2023 134 (L)  135 - 145 mmol/L Final   Potassium 04/08/2023 4.1  3.5 - 5.1 mmol/L Final   Chloride 04/08/2023 103  98 - 111 mmol/L Final   CO2 04/08/2023 26  22 - 32 mmol/L Final   Glucose, Bld 04/08/2023 141 (H)  70 - 99  mg/dL Final   Glucose reference range applies only to samples taken after fasting for at least 8 hours.   BUN 04/08/2023 20  8 - 23 mg/dL Final   Creatinine, Ser 04/08/2023 0.93  0.44 - 1.00 mg/dL Final   Calcium  04/08/2023 7.6 (L)  8.9 - 10.3 mg/dL Final   GFR, Estimated 04/08/2023 >60  >60 mL/min Final   Comment: (NOTE) Calculated using the CKD-EPI Creatinine Equation (2021)    Anion gap 04/08/2023 5  5 - 15 Final   Performed at [facility], [address], [place], [place] [address]   WBC 04/09/2023 7.3  4.0 - 10.5 K/uL Final   RBC 04/09/2023 3.48 (L)  3.87 - 5.11 MIL/uL Final   Hemoglobin 04/09/2023 9.8 (L)  12.0 - 15.0 g/dL Final   HCT 96/92/7974 30.0 (L)  36.0 - 46.0 % Final   MCV 04/09/2023 86.2  80.0 - 100.0 fL Final   MCH 04/09/2023 28.2  26.0 - 34.0 pg Final   MCHC 04/09/2023 32.7  30.0 - 36.0 g/dL Final   RDW 96/92/7974 13.6  11.5 - 15.5 % Final   Platelets 04/09/2023 298  150 - 400 K/uL Final   nRBC 04/09/2023 0.0  0.0 - 0.2 % Final   Performed at [facility], [address], [place], [place] [address]   Sodium 04/09/2023 136  135 - 145 mmol/L Final   Potassium 04/09/2023 3.8  3.5 - 5.1 mmol/L Final   Chloride 04/09/2023 100  98 - 111 mmol/L Final   CO2 04/09/2023 25  22 - 32 mmol/L Final   Glucose, Bld 04/09/2023 115 (H)  70 - 99 mg/dL Final   Glucose reference range applies  only to samples taken after fasting for at least 8 hours.   BUN 04/09/2023 20  8 - 23 mg/dL Final   Creatinine, Ser 04/09/2023 1.00  0.44 - 1.00 mg/dL Final   Calcium  04/09/2023 8.0 (L)  8.9 - 10.3 mg/dL Final   GFR, Estimated 04/09/2023 >60  >60 mL/min Final   Comment: (NOTE) Calculated using the CKD-EPI Creatinine Equation (2021)    Anion gap 04/09/2023 11  5 - 15 Final   Performed at [facility], [address] [place], [place] [address]    PSYCHIATRIC REVIEW OF SYSTEMS (ROS)  ROS: Notable for the following relevant positive findings: Review of Systems   Psychiatric/Behavioral:  Positive for memory loss.     Additional findings:      Musculoskeletal: No abnormal movements observed      Gait & Station: Laying/Sitting      Pain Screening: Denies      Nutrition & Dental Concerns: n/a   RISK FORMULATION/ASSESSMENT  Is the patient experiencing any suicidal or homicidal ideations: No    Protective factors considered for safety management: Supervised setting, social support  Risk factors/concerns considered for safety management:  Age over 72 Impulsivity Aggression  Is there a safety management plan with the patient and treatment team to minimize risk factors and promote protective factors: No           Explain: Patient with dementia and intermittent agitation, would not benefit from inpatient psychiatric hospitalization at this time. Medication as above and outpatient follow up.  Is crisis care placement or psychiatric hospitalization recommended: No     Based on my current evaluation and risk assessment, patient is determined at this time to be at:  Moderate Risk  *RISK ASSESSMENT Risk assessment is a dynamic process; it is possible that this patient's condition, and risk level, may change. This should be re-evaluated and managed over time as appropriate. Please re-consult psychiatric consult services if additional assistance is needed in terms of risk assessment and management. If your team decides to discharge this patient, please advise the patient how to best access emergency psychiatric services, or to call 911, if their condition worsens or they feel unsafe in any way.   Erla JAYSON Rase, MD Telepsychiatry Consult Services

## 2023-11-09 NOTE — ED Notes (Signed)
Pt states she can not void at this time 

## 2023-11-09 NOTE — ED Notes (Addendum)
 3rd RN attempt labs at this time, unsuccessful x2 while waiting on lab. Pt compliant with 2 L [place] at this time, not compliant with monitors.

## 2023-11-09 NOTE — ED Notes (Signed)
 PT has significant wheezing audible in all lung fields. EDP updated and breathing tx given. Pt endorses smoking a pack a day. Nicotine  patch declined.

## 2023-11-09 NOTE — ED Provider Notes (Addendum)
 [facility] Provider Note    Event Date/Time   First MD Initiated Contact with Patient 11/09/23 1604     (approximate)   History   Aggressive Behavior   HPI  Rachel [name] is a 68 year old female with history of HTN, CVA, bipolar disorder presenting to the emergency department for evaluation of aggressive behavior.  [facility] was reportedly [facility] with [facility] when they were attempting to apply nicotine  patch.  Here, patient able to tell me her name, reportedly states she wants a cigarette.  Facility did reports concern that patient was confused from her baseline, but EMS report that patient is A&O x 1 at baseline.    Physical Exam   Triage Vital Signs: ED Triage Vitals  Encounter Vitals Group     BP 11/09/23 1605 (!) 145/83     Girls Systolic BP Percentile --      Girls Diastolic BP Percentile --      Boys Systolic BP Percentile --      Boys Diastolic BP Percentile --      Pulse Rate 11/09/23 1605 77     Resp 11/09/23 1605 20     Temp 11/09/23 1605 97.7 F (36.5 C)     Temp src --      SpO2 11/09/23 1605 100 %     Weight 11/09/23 1606 169 lb (76.7 kg)     Height 11/09/23 1606 5' 6.5 (1.689 m)     Head Circumference --      [facility] Flow --      Pain Score 11/09/23 1606 0     Pain Loc --      Pain Education --      Exclude from Growth Chart --     Most recent vital signs: Vitals:   11/09/23 1605 11/09/23 2230  BP: (!) 145/83 (!) 153/95  Pulse: 77 75  Resp: 20 19  Temp: 97.7 F (36.5 C)   SpO2: 100% 100%     General: Awake, interactive  CV:  Good peripheral perfusion Resp:  Unlabored respirations Abd:  Nondistended.  Neuro:  No gross facial asymmetry, able to tell me her name, fluid speech   ED Results / Procedures / Treatments   Labs (all labs ordered are listed, but only abnormal results are displayed) Labs Reviewed  COMPREHENSIVE METABOLIC PANEL WITH GFR - Abnormal; Notable for the following components:      Result  Value   Potassium 3.4 (*)    Glucose, Bld 128 (*)    All other components within normal limits  CBC WITH DIFFERENTIAL/PLATELET  ETHANOL  URINE DRUG SCREEN, QUALITATIVE ([facility] ONLY)  URINALYSIS, W/ REFLEX TO CULTURE (INFECTION SUSPECTED)     EKG EKG independently reviewed and interpreted by myself demonstrates:  EKG demonstrate sinus rhythm at a rate of 78, PR 156, QRS 92, QTc 474, no acute ST changes  RADIOLOGY Imaging independently reviewed and interpreted by myself demonstrates:  CT head without acute bleed  Formal Radiology Read:  CT Head Wo Contrast Result Date: 11/09/2023 CLINICAL DATA:  Mental status change, unknown cause EXAM: CT HEAD WITHOUT CONTRAST TECHNIQUE: Contiguous axial images were obtained from the base of the skull through the vertex without intravenous contrast. RADIATION DOSE REDUCTION: This exam was performed according to the departmental dose-optimization program which includes automated exposure control, adjustment of the mA and/or kV according to patient size and/or use of iterative reconstruction technique. COMPARISON:  Head CT 10/03/2023 FINDINGS: Brain: No intracranial hemorrhage, mass effect,  or midline shift. Stable degree of atrophy and chronic small vessel ischemia. No hydrocephalus. The basilar cisterns are patent. Remote lacunar infarcts in the left basal ganglia. No evidence of territorial infarct or acute ischemia. No extra-axial or intracranial fluid collection. Vascular: Atherosclerosis of skullbase vasculature without hyperdense vessel or abnormal calcification. Skull: No fracture or focal lesion. Sinuses/Orbits: No acute finding. Other: None. IMPRESSION: 1. No acute intracranial abnormality. 2. Stable atrophy and chronic small vessel ischemia. Remote lacunar infarcts in the left basal ganglia. Electronically Signed   By: Andrea Gasman M.D.   On: 11/09/2023 16:58    PROCEDURES:  Critical Care performed: No  Procedures   MEDICATIONS ORDERED IN  ED: Medications  ipratropium-albuterol (DUONEB) 0.5-2.5 (3) MG/3ML nebulizer solution 6 mL (6 mLs Nebulization Given 11/09/23 2227)     IMPRESSION / MDM / ASSESSMENT AND PLAN / ED COURSE  I reviewed the triage vital signs and the nursing notes.  Differential diagnosis includes, but is not limited to, primary psychiatric disorder, consideration for medical etiology of confusion including anemia, electrolyte abnormality, infection, intracranial bleed the patient does have a history of chronic confusion documented and unclear if she is actually altered from her baseline  Patient's presentation is most consistent with acute presentation with potential threat to life or bodily function.  68 year old female presenting with aggressive behavior in her facility.  CT head without acute bleed.  Patient is oriented to self on my exam, calm.  Labs pending due to difficult access, but will go ahead and consult psychiatry and TTS for further recommendations in the setting of her increased aggression.  Clinical Course as of 11/09/23 2309  Tue Nov 09, 2023  2020 Received update from Dr. Anastacio with behavioral health team who evaluated the patient.  She obtained collateral from patient's daughter who notes that the patient does have a history of dementia.  She notes that she recommends against the use of any benzos for her dementia related agitation.  She instead recommend Seroquel at 12.5 mg nightly and 12.5 mg p.o. twice daily as needed for agitation.  She does recommend follow-up on the patient's urinalysis prior to disposition.  [NR]  2308 Awaiting urinalysis.  Signed out to oncoming physician pending urine and disposition.  Has already been psychiatrically cleared with medication recommendations as above.  If urinalysis is positive, may still be appropriate for discharge with appropriate treatment if behavior remains improved. [NR]    Clinical Course User Index [NR] Levander Slate, MD   The patient has been  placed in psychiatric observation due to the need to provide a safe environment for the patient while obtaining psychiatric consultation and evaluation, as well as ongoing medical and medication management to treat the patient's condition.  The patient has not been placed under full IVC at this time.   FINAL CLINICAL IMPRESSION(S) / ED DIAGNOSES   Final diagnoses:  Aggressive behavior  Dementia, unspecified dementia severity, unspecified dementia type, unspecified whether behavioral, psychotic, or mood disturbance or anxiety (HCC)     Rx / DC Orders   ED Discharge Orders     None        Note:  This document was prepared using Dragon voice recognition software and may include unintentional dictation errors.   Levander Slate, MD 11/09/23 7690    Levander Slate, MD 12/14/23 [phone]

## 2023-11-09 NOTE — ED Notes (Signed)
 Lab contacted x3 for labs.

## 2023-11-09 NOTE — ED Notes (Signed)
 TTS assessment underway at this time. Pt just ambulated to restroom with 1 person assist.

## 2023-11-09 NOTE — ED Notes (Signed)
 Lab contacted x2 for phlebotomy

## 2023-11-09 NOTE — BH Assessment (Signed)
 Psych consult has been placed for patient to be seen by [facility] providers.

## 2023-11-09 NOTE — ED Notes (Signed)
 Bed alarm in place, fall risk bracelet placed on pt.  2 unsuccessful phlebotomy attempts, lab contacted.

## 2023-11-09 NOTE — ED Notes (Signed)
 Pt has taken off all monitors and bracelets. Not willing to have placed back on

## 2023-11-09 NOTE — ED Notes (Signed)
 PT encouraged to provide urine sample. She declined at this time. PO fluids encouraged.

## 2023-11-09 NOTE — ED Notes (Signed)
 PT given another cup of water and assisted to commode. Unable to void. Will continue to try to get sample.

## 2023-11-10 LAB — URINE DRUG SCREEN, QUALITATIVE (ARMC ONLY)
Amphetamines, Ur Screen: NOT DETECTED
Barbiturates, Ur Screen: NOT DETECTED
Benzodiazepine, Ur Scrn: POSITIVE — AB
Cannabinoid 50 Ng, Ur ~~LOC~~: NOT DETECTED
Cocaine Metabolite,Ur ~~LOC~~: NOT DETECTED
MDMA (Ecstasy)Ur Screen: NOT DETECTED
Methadone Scn, Ur: NOT DETECTED
Opiate, Ur Screen: NOT DETECTED
Phencyclidine (PCP) Ur S: NOT DETECTED
Tricyclic, Ur Screen: NOT DETECTED

## 2023-11-10 LAB — URINALYSIS, W/ REFLEX TO CULTURE (INFECTION SUSPECTED)
Bacteria, UA: NONE SEEN
Bilirubin Urine: NEGATIVE
Glucose, UA: NEGATIVE mg/dL
Hgb urine dipstick: NEGATIVE
Ketones, ur: NEGATIVE mg/dL
Leukocytes,Ua: NEGATIVE
Nitrite: NEGATIVE
Protein, ur: NEGATIVE mg/dL
Specific Gravity, Urine: 1.019 (ref 1.005–1.030)
Squamous Epithelial / HPF: 0 /HPF (ref 0–5)
pH: 5 (ref 5.0–8.0)

## 2023-11-10 NOTE — ED Notes (Signed)
 Pt moved to hallway bed.   Waiting on [facility]

## 2023-11-10 NOTE — ED Notes (Signed)
 Pt sleeping.

## 2023-11-10 NOTE — ED Notes (Signed)
 Called Mandy from [facility]/ pt 2nd on list to transport

## 2023-11-10 NOTE — ED Notes (Signed)
 Called Rachel [name] from [facility]/ pt 2nd on list to transport   [facility] has arrived

## 2023-11-10 NOTE — ED Provider Notes (Addendum)
 Cleared by psychiatry for discharge.  Urinalysis reviewed and without infection.  Patient breathing comfortably on room air, no hypoxemia.  Appropriate for discharge.   Cyrena Mylar, MD 11/10/23 9953    Cyrena Mylar, MD 11/10/23 [phone]

## 2023-11-11 ENCOUNTER — Other Ambulatory Visit: Payer: Self-pay

## 2023-11-11 ENCOUNTER — Emergency Department
Admission: EM | Admit: 2023-11-11 | Discharge: 2023-11-12 | Disposition: A | Discharge status: Skilled Nursing Facility | Attending: Emergency Medicine | Admitting: Emergency Medicine

## 2023-11-11 DIAGNOSIS — R4689 Other symptoms and signs involving appearance and behavior: Secondary | ICD-10-CM | POA: Diagnosis present

## 2023-11-11 DIAGNOSIS — I10 Essential (primary) hypertension: Secondary | ICD-10-CM | POA: Diagnosis not present

## 2023-11-11 DIAGNOSIS — R456 Violent behavior: Secondary | ICD-10-CM | POA: Diagnosis not present

## 2023-11-11 DIAGNOSIS — Z79899 Other long term (current) drug therapy: Secondary | ICD-10-CM | POA: Diagnosis not present

## 2023-11-11 DIAGNOSIS — Z8673 Personal history of transient ischemic attack (TIA), and cerebral infarction without residual deficits: Secondary | ICD-10-CM | POA: Diagnosis not present

## 2023-11-11 DIAGNOSIS — F17210 Nicotine dependence, cigarettes, uncomplicated: Secondary | ICD-10-CM | POA: Diagnosis not present

## 2023-11-11 LAB — COMPREHENSIVE METABOLIC PANEL WITH GFR
ALT: 18 U/L (ref 0–44)
AST: 18 U/L (ref 15–41)
Albumin: 3.5 g/dL (ref 3.5–5.0)
Alkaline Phosphatase: 79 U/L (ref 38–126)
Anion gap: 11 (ref 5–15)
BUN: 24 mg/dL — ABNORMAL HIGH (ref 8–23)
CO2: 24 mmol/L (ref 22–32)
Calcium: 9.1 mg/dL (ref 8.9–10.3)
Chloride: 107 mmol/L (ref 98–111)
Creatinine, Ser: 0.77 mg/dL (ref 0.44–1.00)
GFR, Estimated: >60 mL/min (ref >60)
Glucose, Bld: 126 mg/dL — ABNORMAL HIGH (ref 70–99)
Potassium: 3.3 mmol/L — ABNORMAL LOW (ref 3.5–5.1)
Sodium: 142 mmol/L (ref 135–145)
Total Bilirubin: 0.4 mg/dL (ref 0.0–1.2)
Total Protein: 7.2 g/dL (ref 6.5–8.1)

## 2023-11-11 MED ORDER — IPRATROPIUM-ALBUTEROL 0.5-2.5 (3) MG/3ML IN SOLN
3.0000 mL | Freq: Once | RESPIRATORY_TRACT | Status: AC
  Administered 2023-11-11: 3 mL via RESPIRATORY_TRACT
  Filled 2023-11-11: qty 3

## 2023-11-11 MED ORDER — NICOTINE 21 MG/24HR TD PT24
21.0000 mg | MEDICATED_PATCH | Freq: Once | TRANSDERMAL | Status: DC
  Administered 2023-11-11: 21 mg via TRANSDERMAL
  Filled 2023-11-11: qty 1
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
## 2023-11-11 NOTE — ED Triage Notes (Signed)
 Pt to ED via EMS from [facility], pt was seen here yesterday for possible uti, pt complaints tonight is she wants a cigarette. Facility wanted her sent here because she has been combative.

## 2023-11-11 NOTE — ED Triage Notes (Signed)
 Pt arrives via EMS from [facility], ems reports staff at facility stated that pt had been noncompliant and pt reporting wanting a cigarette; staff reports pt hasn't smoked in 2 years. Pt A&O x2, which is her baseline. Pt refusing vitals in triage.

## 2023-11-11 NOTE — ED Provider Notes (Signed)
 [facility] Provider Note    Event Date/Time   First MD Initiated Contact with Patient 11/11/23 2153     (approximate)   History   Chief Complaint Altered Mental Status   HPI  Rachel [name] is a 68 y.o. female with past medical history of hypertension, stroke, and bipolar disorder who presents to the ED for altered mental status.  Per EMS, staff at patient's nursing facility was concerned about increasing aggressive behavior and noncompliance over the past 48 hours.  She was reportedly seen in the ED yesterday with unremarkable workup, cleared by psychiatry for discharge back to facility at that time.  Patient currently only states that she wants a cigarette and is repeatedly asking to have name band taken off of her.  She denies any fevers, cough, chest pain, shortness of breath, nausea, vomiting, or diarrhea.  She is at her reported baseline mental status.     Physical Exam   Triage Vital Signs: ED Triage Vitals [11/11/23 2148]  Encounter Vitals Group     BP      Girls Systolic BP Percentile      Girls Diastolic BP Percentile      Boys Systolic BP Percentile      Boys Diastolic BP Percentile      Pulse      Resp      Temp      Temp src      SpO2      Weight      Height      Head Circumference      [place] Flow      Pain Score 0     Pain Loc      Pain Education      Exclude from Growth Chart     Most recent vital signs: Vitals:   11/11/23 2222 11/11/23 2245  BP:  (!) 145/76  Pulse:  84  Resp:  15  Temp:  98.7 F (37.1 C)  SpO2: 100% 100%    Constitutional: Alert and oriented to person and place, but not time or situation. Eyes: Conjunctivae are normal. Head: Atraumatic. Nose: No congestion/rhinnorhea. Mouth/Throat: Mucous membranes are moist.  Cardiovascular: Normal rate, regular rhythm. Grossly normal heart sounds.  2+ radial pulses bilaterally. Respiratory: Normal respiratory effort.  No retractions. Lungs  CTAB. Gastrointestinal: Soft and nontender. No distention. Musculoskeletal: No lower extremity tenderness nor edema.  Neurologic:  Normal speech and language. No gross focal neurologic deficits are appreciated.    ED Results / Procedures / Treatments   Labs (all labs ordered are listed, but only abnormal results are displayed) Labs Reviewed  COMPREHENSIVE METABOLIC PANEL WITH GFR - Abnormal; Notable for the following components:      Result Value   Potassium 3.3 (*)    Glucose, Bld 126 (*)    BUN 24 (*)    All other components within normal limits  CBC WITH DIFFERENTIAL/PLATELET     EKG  ED ECG REPORT I, Carlin Palin, the attending physician, personally viewed and interpreted this ECG.   Date: 11/11/2023  EKG Time: 22:46  Rate: 85  Rhythm: normal sinus rhythm  Axis: Normal  Intervals:left bundle branch block  ST&T Change: None  PROCEDURES:  Critical Care performed: No  Procedures   MEDICATIONS ORDERED IN ED: Medications  nicotine  (NICODERM CQ  - dosed in mg/24 hours) patch 21 mg (21 mg Transdermal Patch Applied 11/11/23 2241)  ALPRAZolam  (XANAX ) tablet 0.5 mg (has no administration in time range)  acetaminophen  (TYLENOL ) tablet 650 mg (has no administration in time range)  amLODipine (NORVASC) tablet 5 mg (has no administration in time range)  hydrALAZINE  (APRESOLINE ) tablet 50 mg (has no administration in time range)  ipratropium-albuterol (DUONEB) 0.5-2.5 (3) MG/3ML nebulizer solution 3 mL (has no administration in time range)  methocarbamol  (ROBAXIN ) tablet 500 mg (has no administration in time range)  oxyCODONE -acetaminophen  (PERCOCET/ROXICET) 5-325 MG per tablet 1 tablet (has no administration in time range)  potassium chloride  (KLOR-CON ) CR tablet 20 mEq (has no administration in time range)  ipratropium-albuterol (DUONEB) 0.5-2.5 (3) MG/3ML nebulizer solution 3 mL (3 mLs Nebulization Given 11/11/23 2312)     IMPRESSION / MDM / ASSESSMENT AND PLAN / ED  COURSE  I reviewed the triage vital signs and the nursing notes.                              68 y.o. female with past medical history of hypertension, stroke, and bipolar disorder who presents to the ED for altered mental status and increased agitation at her nursing facility.  Patient's presentation is most consistent with acute presentation with potential threat to life or bodily function.  Differential diagnosis includes, but is not limited to, stroke, anemia, electrolyte abnormality, AKI, UTI, dementia, bipolar disorder.  Patient nontoxic-appearing and in no acute distress, vital signs are unremarkable.  She is oriented to her baseline and has a nonfocal neurologic exam.  CT head was performed during her ED visit yesterday and unremarkable, do not feel repeat needed at this time.  Labs without significant anemia, leukocytosis, electrolyte abnormality, or AKI.  LFTs are unremarkable, patient also had urinalysis during ED visit yesterday without evidence of UTI.  Patient would benefit from psychiatric evaluation to assist with medication management of worsening agitation.  Psych consult pending at this time.      FINAL CLINICAL IMPRESSION(S) / ED DIAGNOSES   Final diagnoses:  Aggressive behavior     Rx / DC Orders   ED Discharge Orders     None        Note:  This document was prepared using Dragon voice recognition software and may include unintentional dictation errors.   Willo Dunnings, MD 11/12/23 [phone]

## 2023-11-12 ENCOUNTER — Other Ambulatory Visit: Payer: Self-pay

## 2023-11-12 DIAGNOSIS — R456 Violent behavior: Secondary | ICD-10-CM | POA: Diagnosis not present

## 2023-11-12 DIAGNOSIS — R419 Unspecified symptoms and signs involving cognitive functions and awareness: Secondary | ICD-10-CM

## 2023-11-12 LAB — CBC WITH DIFFERENTIAL/PLATELET
Abs Immature Granulocytes: 0.02 K/uL (ref 0.00–0.07)
Basophils Absolute: 0 K/uL (ref 0.0–0.1)
Basophils Relative: 0 %
Eosinophils Absolute: 0.3 K/uL (ref 0.0–0.5)
Eosinophils Relative: 3 %
HCT: 38.8 % (ref 36.0–46.0)
Hemoglobin: 12.3 g/dL (ref 12.0–15.0)
Immature Granulocytes: 0 %
Lymphocytes Relative: 13 %
Lymphs Abs: 1.2 K/uL (ref 0.7–4.0)
MCH: 27 pg (ref 26.0–34.0)
MCHC: 31.7 g/dL (ref 30.0–36.0)
MCV: 85.1 fL (ref 80.0–100.0)
Monocytes Absolute: 0.7 K/uL (ref 0.1–1.0)
Monocytes Relative: 8 %
Neutro Abs: 6.9 K/uL (ref 1.7–7.7)
Neutrophils Relative %: 76 %
Platelets: UNDETERMINED K/uL (ref 150–400)
RBC: 4.56 MIL/uL (ref 3.87–5.11)
RDW: 14.6 % (ref 11.5–15.5)
WBC: 9.2 K/uL (ref 4.0–10.5)
nRBC: 0 % (ref 0.0–0.2)

## 2023-11-12 MED ORDER — METHOCARBAMOL 500 MG PO TABS: 500.0000 mg | ORAL_TABLET | Freq: Four times a day (QID) | ORAL | Status: DC | PRN

## 2023-11-12 MED ORDER — HYDRALAZINE HCL 50 MG PO TABS
50.0000 mg | ORAL_TABLET | Freq: Three times a day (TID) | ORAL | Status: DC
  Administered 2023-11-12: 50 mg via ORAL
  Filled 2023-11-12: qty 1

## 2023-11-12 MED ORDER — POTASSIUM CHLORIDE CRYS ER 20 MEQ PO TBCR
20.0000 meq | EXTENDED_RELEASE_TABLET | Freq: Once | ORAL | Status: AC
  Administered 2023-11-12: 20 meq via ORAL
  Filled 2023-11-12: qty 1

## 2023-11-12 MED ORDER — IPRATROPIUM-ALBUTEROL 0.5-2.5 (3) MG/3ML IN SOLN
3.0000 mL | Freq: Four times a day (QID) | RESPIRATORY_TRACT | Status: DC | PRN
  Filled 2023-11-12: qty 3

## 2023-11-12 MED ORDER — ACETAMINOPHEN 325 MG PO TABS: 650.0000 mg | ORAL_TABLET | Freq: Three times a day (TID) | ORAL | Status: DC | PRN

## 2023-11-12 MED ORDER — OXYCODONE-ACETAMINOPHEN 5-325 MG PO TABS: 1.0000 | ORAL_TABLET | ORAL | Status: DC | PRN

## 2023-11-12 MED ORDER — OLANZAPINE 5 MG PO TBDP
10.0000 mg | ORAL_TABLET | Freq: Once | ORAL | Status: AC
  Administered 2023-11-12: 10 mg via ORAL
  Filled 2023-11-12: qty 2

## 2023-11-12 MED ORDER — QUETIAPINE FUMARATE 25 MG PO TABS: 25.0000 mg | ORAL_TABLET | Freq: Once | ORAL | Status: DC

## 2023-11-12 MED ORDER — ALPRAZOLAM 0.5 MG PO TABS
0.5000 mg | ORAL_TABLET | Freq: Four times a day (QID) | ORAL | Status: DC | PRN
  Administered 2023-11-12: 0.5 mg via ORAL
  Filled 2023-11-12: qty 1

## 2023-11-12 MED ORDER — QUETIAPINE FUMARATE 25 MG PO TABS
25.0000 mg | ORAL_TABLET | Freq: Every evening | ORAL | 1 refills | Status: AC | PRN
  Filled 2023-11-12: qty 60, 30d supply, fill #0

## 2023-11-12 MED ORDER — AMLODIPINE BESYLATE 5 MG PO TABS: 5.0000 mg | ORAL_TABLET | Freq: Every day | ORAL | Status: DC

## 2023-11-12 NOTE — ED Notes (Signed)
 Pt assisted to bathroom in pt room by this tech and Luke, RN, pt noted to be wheezing very badly when assisted back to bed.

## 2023-11-12 NOTE — Consult Note (Signed)
 [facility] Consult Note  Patient Name: Rachel [name] MRN: [id number] DOB: 08-24-1955 DATE OF Consult: 11/12/2023  PRIMARY PSYCHIATRIC DIAGNOSES  1.  Neurocognitive disorder with behavioral disturbance   RECOMMENDATIONS  Recommendations:  Medication recommendations: I do recommend titration of her quetiapine (Seroquel) to 25 mg PO nightly and BID PRN doses of 25 mg (ie, 25 mg BID PRN for anxiety/agitation), and Psychiatric OP follow-up.  Non-Medication/therapeutic recommendations: Follow-up with OP provider Is inpatient psychiatric hospitalization recommended for this patient? No (Explain why): cooperative in ED and with limited utility from IP due to dementia  Follow-Up Telepsychiatry C/L services: We will sign off for now. Please re-consult our service if needed for any concerning changes in the patient's condition, discharge planning, or questions.  Communication: Treatment team members (and family members if applicable) who were involved in treatment/care discussions and planning, and with whom we spoke or engaged with via secure text/chat, include the following: EDRN  Thank you for involving us  in the care of this patient. If you have any additional questions or concerns, please call [phone] and ask for me or the provider on-call.  TELEPSYCHIATRY ATTESTATION & CONSENT  As the provider for this telehealth consult, I attest that I verified the patient's identity using two separate identifiers, introduced myself to the patient, provided my credentials, disclosed my location, and performed this encounter via a HIPAA-compliant, real-time, face-to-face, two-way, interactive audio and video platform and with the full consent and agreement of the patient (or guardian as applicable.)  Patient physical location: ED at [facility]. Telehealth provider physical location: home office in state of [place] .  Video start time: 140a CST (Central Time) Video end time: 150a CST  (Central Time)  IDENTIFYING DATA  Rachel [name] is a 68 y.o. year-old female for whom a psychiatric consultation has been ordered by the primary provider. The patient was identified using two separate identifiers.  CHIEF COMPLAINT/REASON FOR CONSULT   Agitation; dementia   HISTORY OF PRESENT ILLNESS (HPI)   The patient is a 68 year old woman with reported history of bipolar-2 disorder, Alzheimer's disease; whom presents to ED from nursing facility with c/o agitation; seen by Psychiatry during ED visit for similar complaints 10/7. Reportedly, the patient was combative and non-adherent with treatment. In ED and with me, she'll be mainly focused on wanting to smoke a cigarette. Reportedly had been treated with PRN Xanax  and Ativan  for agitation; quetiapine was recommended (12.5 mg nightly + BID PRN) during last evaluation.  The patient, when seen, is oriented only to self and general place (a hospital); she is disoriented to year or situation. As above, her only complaint is wanting to smoke. Denies any suicidal ideation; denies thoughts of harming others or AVH. She cannot provide rational history re: reported context of her combative behavior. She is, at present, calm.   I did attempt to reach patient's daughter, though I was not successful. I did review the collateral from her from 10/7.  The patient does not appear to be an imminent danger to herself or others at this time; her neurocognitive deficits would likely limit her benefit from acute inpatient Psychiatric admission as well. I do recommend titration of her quetiapine (Seroquel) to 25 mg PO nightly and BID PRN doses of 25 mg (ie, 25 mg BID PRN for anxiety/agitation), and Psychiatric OP follow-up. At present I do not believe the patient requires Psychiatric admission, though if she were to deteriorate or demonstrate acutely unsafe behavior in ED context this could be evidence of  inability to maintain safety. She has, however, been calm.    PAST PSYCHIATRIC HISTORY  Limited due to dementia; No known h/o suicide attempts; Recent seroquel start per 10/7 consult  Otherwise as per HPI above.  PAST MEDICAL HISTORY  Past Medical History:  Diagnosis Date   Chronic confusion    well documented in the medical record   Hepatitis C    Hypertension      HOME MEDICATIONS  Facility Ordered Medications  Medication   nicotine  (NICODERM CQ  - dosed in mg/24 hours) patch 21 mg   [COMPLETED] ipratropium-albuterol (DUONEB) 0.5-2.5 (3) MG/3ML nebulizer solution 3 mL   ALPRAZolam  (XANAX ) tablet 0.5 mg   acetaminophen  (TYLENOL ) tablet 650 mg   amLODipine (NORVASC) tablet 5 mg   hydrALAZINE  (APRESOLINE ) tablet 50 mg   ipratropium-albuterol (DUONEB) 0.5-2.5 (3) MG/3ML nebulizer solution 3 mL   methocarbamol  (ROBAXIN ) tablet 500 mg   oxyCODONE -acetaminophen  (PERCOCET/ROXICET) 5-325 MG per tablet 1 tablet   [COMPLETED] potassium chloride  SA (KLOR-CON  M) CR tablet 20 mEq   PTA Medications  Medication Sig   acetaminophen  (TYLENOL ) 325 MG tablet Take 2 tablets (650 mg total) by mouth every 8 (eight) hours as needed for mild pain (pain score 1-3), fever or headache.   ondansetron  (ZOFRAN ) 4 MG tablet Take 1 tablet (4 mg total) by mouth every 6 (six) hours as needed for nausea.   methocarbamol  (ROBAXIN ) 500 MG tablet Take 1 tablet (500 mg total) by mouth every 6 (six) hours as needed for muscle spasms.   Vitamin D , Ergocalciferol , (DRISDOL ) 1.25 MG (50000 UNIT) CAPS capsule Take 1 capsule (50,000 Units total) by mouth every 7 (seven) days.   oxyCODONE -acetaminophen  (PERCOCET) 5-325 MG tablet Take 1 tablet by mouth every 4 (four) hours as needed for severe pain (pain score 7-10).   alendronate (FOSAMAX) 35 MG tablet Take 35 mg by mouth every 7 (seven) days. Take with a full glass of water on an empty stomach. Monday   amLODipine (NORVASC) 5 MG tablet Take 5 mg by mouth daily.   Calcium  Citrate-Vitamin D3 (CITRACAL MAXIMUM) 315-6.25 MG-MCG TABS  Take 2 tablets by mouth daily.   hydrALAZINE  (APRESOLINE ) 50 MG tablet Take 50 mg by mouth every 8 (eight) hours.   ipratropium-albuterol (DUONEB) 0.5-2.5 (3) MG/3ML SOLN Take 3 mLs by nebulization every 6 (six) hours as needed (wheezing).   naloxone (NARCAN) nasal spray 4 mg/0.1 mL Place 1 spray into the nose once.   nicotine  (NICODERM CQ  - DOSED IN MG/24 HOURS) 21 mg/24hr patch Place 21 mg onto the skin daily.   ALPRAZolam  (XANAX ) 0.5 MG tablet Take 0.5 mg by mouth every 6 (six) hours as needed for anxiety.     ALLERGIES  Allergies  Allergen Reactions   Tetracycline Hives   Penicillins Rash   Tetracyclines & Related Rash    SOCIAL & SUBSTANCE USE HISTORY  Social History   Socioeconomic History   Marital status: Married    Spouse name: Not on file   Number of children: Not on file   Years of education: Not on file   Highest education level: Not on file  Occupational History   Not on file  Tobacco Use   Smoking status: Every Day    Current packs/day: 1.00    Types: Cigarettes   Smokeless tobacco: Never  Substance and Sexual Activity   Alcohol use: Yes    Comment: rarely   Drug use: Yes    Frequency: 1.0 times per week    Types: Marijuana  Comment: former IV drug user per pt   Sexual activity: Not on file  Other Topics Concern   Not on file  Social History Narrative   Not on file   Social Drivers of Health   Financial Resource Strain: Not on file  Food Insecurity: Patient Declined (04/06/2023)   Hunger Vital Sign    Worried About Running Out of Food in the Last Year: Patient declined    Ran Out of Food in the Last Year: Patient declined  Transportation Needs: No Transportation Needs (04/06/2023)   PRAPARE - Administrator, Civil Service (Medical): No    Lack of Transportation (Non-Medical): No  Physical Activity: Not on file  Stress: Not on file  Social Connections: Unknown (04/06/2023)   Social Connection and Isolation Panel    Frequency of  Communication with Friends and Family: Once a week    Frequency of Social Gatherings with Friends and Family: Once a week    Attends Religious Services: 1 to 4 times per year    Active Member of [employer] or Organizations: Patient declined    Attends Engineer, structural: Patient declined    Marital Status: Married   Social History   Tobacco Use  Smoking Status Every Day   Current packs/day: 1.00   Types: Cigarettes  Smokeless Tobacco Never   Social History   Substance and Sexual Activity  Alcohol Use Yes   Comment: rarely   Social History   Substance and Sexual Activity  Drug Use Yes   Frequency: 1.0 times per week   Types: Marijuana   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  FAMILY HISTORY  No family history on file. 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 rate 15, SpO2 100%.  LABS  Admission on 11/11/2023  Component Date Value Ref Range Status   WBC 11/11/2023 9.2  4.0 - 10.5 K/uL Final   RBC 11/11/2023 4.56  3.87 - 5.11 MIL/uL Final   Hemoglobin 11/11/2023 12.3  12.0 - 15.0 g/dL Final    HCT 89/90/7974 38.8  36.0 - 46.0 % Final   MCV 11/11/2023 85.1  80.0 - 100.0 fL Final   MCH 11/11/2023 27.0  26.0 - 34.0 pg Final   MCHC 11/11/2023 31.7  30.0 - 36.0 g/dL Final   RDW 89/90/7974 14.6  11.5 - 15.5 % Final   Platelets 11/11/2023 PLATELET CLUMPS NOTED ON SMEAR, UNABLE TO ESTIMATE  150 - 400 K/uL Final   PLATELET CLUMPS NOTED ON SMEAR, UNABLE TO ESTIMATE   nRBC 11/11/2023 0.0  0.0 - 0.2 % Final   Neutrophils Relative % 11/11/2023 76  % Final   Neutro Abs 11/11/2023 6.9  1.7 - 7.7 K/uL Final   Lymphocytes Relative 11/11/2023 13  % Final   Lymphs Abs 11/11/2023 1.2  0.7 - 4.0 K/uL Final   Monocytes Relative 11/11/2023 8  % Final   Monocytes Absolute 11/11/2023 0.7  0.1 - 1.0 K/uL Final   Eosinophils Relative 11/11/2023 3  % Final   Eosinophils Absolute 11/11/2023 0.3  0.0 - 0.5 K/uL Final   Basophils Relative 11/11/2023 0  % Final   Basophils Absolute 11/11/2023 0.0  0.0 - 0.1 K/uL Final   WBC Morphology 11/11/2023 MORPHOLOGY UNREMARKABLE   Final   RBC Morphology 11/11/2023 MORPHOLOGY UNREMARKABLE   Final   Smear Review 11/11/2023 See Note   Final   PLATELET CLUMPS NOTED ON SMEAR, UNABLE TO ESTIMATE   Immature Granulocytes 11/11/2023 0  % Final   Abs Immature Granulocytes 11/11/2023 0.02  0.00 - 0.07 K/uL Final   Performed at [facility], [address] [place], [place] [address]   Sodium 11/11/2023 142  135 - 145 mmol/L Final   Potassium 11/11/2023 3.3 (L)  3.5 - 5.1 mmol/L Final   Chloride 11/11/2023 107  98 - 111 mmol/L Final   CO2 11/11/2023 24  22 - 32 mmol/L Final   Glucose, Bld 11/11/2023 126 (H)  70 - 99 mg/dL Final   Glucose reference range applies only to samples taken after fasting for at least 8 hours.   BUN 11/11/2023 24 (H)  8 - 23 mg/dL Final   Creatinine, Ser 11/11/2023 0.77  0.44 - 1.00 mg/dL Final   Calcium  11/11/2023 9.1  8.9 - 10.3 mg/dL Final   Total Protein 89/90/7974 7.2  6.5 - 8.1 g/dL Final   Albumin 89/90/7974 3.5  3.5 - 5.0 g/dL  Final   AST 89/90/7974 18  15 - 41 U/L Final   ALT 11/11/2023 18  0 - 44 U/L Final   Alkaline Phosphatase 11/11/2023 79  38 - 126 U/L Final   Total Bilirubin 11/11/2023 0.4  0.0 - 1.2 mg/dL Final   GFR, Estimated 11/11/2023 >60  >60 mL/min Final   Comment: (NOTE) Calculated using the CKD-EPI Creatinine Equation (2021)    Anion gap 11/11/2023 11  5 - 15 Final   Performed at [facility], [address] [place], [place] [address]    PSYCHIATRIC REVIEW OF SYSTEMS (ROS)  ROS: Notable for the following relevant positive findings: ROS - limited due to dementia  + memory loss  Additional findings:      Musculoskeletal: No abnormal movements observed      Gait & Station: Laying/Sitting      Pain Screening: none endorsed   RISK FORMULATION/ASSESSMENT  Is the patient experiencing any suicidal or homicidal ideations: No        Protective factors considered for safety management: secure environment  Risk factors/concerns considered for safety management:  Physical illness/chronic pain Age over 84 Impulsivity  Is there a safety management plan with the patient and treatment team to minimize risk factors and promote protective factors: Yes           Explain: med adjustment; OP follow-up Is crisis care placement or psychiatric hospitalization recommended: No     Based on my current evaluation and risk assessment, patient is determined at this time to be at:  Moderate Risk; acutely lower  *RISK ASSESSMENT Risk assessment is a dynamic process; it is  possible that this patient's condition, and risk level, may change. This should be re-evaluated and managed over time as appropriate. Please re-consult psychiatric consult services if additional assistance is needed in terms of risk assessment and management. If your team decides to discharge this patient, please advise the patient how to best access emergency psychiatric services, or to call 911, if their condition worsens or they feel  unsafe in any way.   Nancyann LITTIE Alert, MD Telepsychiatry Consult Services

## 2023-11-12 NOTE — ED Notes (Signed)
 Pt has expiratory wheezing. This RN attempted to give pt a duo-neb treatment but pt began pushing this RN's hand away. Pt continues to refuse medication at this time.

## 2023-11-12 NOTE — Discharge Instructions (Signed)
 Please start quetiapine/Seroquel 25 mg every night at bedtime.  Can also be used twice per day as needed for agitation in addition to this.

## 2023-11-12 NOTE — ED Notes (Signed)
 Pt refusing vitals pre discharge

## 2023-11-12 NOTE — ED Provider Notes (Signed)
 Patient seen and evaluated by psychiatry who recommends adding quetiapine to her outpatient regimen.  I write this prescription and we will plan to discharge back to her facility.   Claudene Rover, MD 11/12/23 [phone]

## 2023-11-25 ENCOUNTER — Other Ambulatory Visit: Payer: Self-pay
